# Patient Record
Sex: Male | Born: 1935 | Race: White | Hispanic: No | Marital: Married | Smoking: Former smoker
Health system: Southern US, Community
[De-identification: ages and names within clinical notes are randomized; demographics above are authoritative.]

## PROBLEM LIST (undated history)

## (undated) DIAGNOSIS — H9190 Unspecified hearing loss, unspecified ear: Secondary | ICD-10-CM

## (undated) DIAGNOSIS — K219 Gastro-esophageal reflux disease without esophagitis: Secondary | ICD-10-CM

## (undated) DIAGNOSIS — H269 Unspecified cataract: Secondary | ICD-10-CM

## (undated) DIAGNOSIS — M1990 Unspecified osteoarthritis, unspecified site: Secondary | ICD-10-CM

## (undated) DIAGNOSIS — M51379 Other intervertebral disc degeneration, lumbosacral region without mention of lumbar back pain or lower extremity pain: Secondary | ICD-10-CM

## (undated) DIAGNOSIS — T8130XA Disruption of wound, unspecified, initial encounter: Secondary | ICD-10-CM

## (undated) DIAGNOSIS — M5137 Other intervertebral disc degeneration, lumbosacral region: Secondary | ICD-10-CM

## (undated) DIAGNOSIS — J3089 Other allergic rhinitis: Secondary | ICD-10-CM

## (undated) DIAGNOSIS — T7840XA Allergy, unspecified, initial encounter: Secondary | ICD-10-CM

## (undated) DIAGNOSIS — E785 Hyperlipidemia, unspecified: Secondary | ICD-10-CM

## (undated) DIAGNOSIS — R011 Cardiac murmur, unspecified: Secondary | ICD-10-CM

## (undated) DIAGNOSIS — Z86010 Personal history of colonic polyps: Secondary | ICD-10-CM

## (undated) HISTORY — DX: Other intervertebral disc degeneration, lumbosacral region without mention of lumbar back pain or lower extremity pain: M51.379

## (undated) HISTORY — PX: TONSILLECTOMY: SUR1361

## (undated) HISTORY — PX: OTHER SURGICAL HISTORY: SHX169

## (undated) HISTORY — DX: Unspecified cataract: H26.9

## (undated) HISTORY — PX: COLONOSCOPY: SHX174

## (undated) HISTORY — DX: Hyperlipidemia, unspecified: E78.5

## (undated) HISTORY — PX: CIRCUMCISION: SUR203

## (undated) HISTORY — DX: Personal history of colonic polyps: Z86.010

## (undated) HISTORY — PX: POLYPECTOMY: SHX149

## (undated) HISTORY — DX: Unspecified osteoarthritis, unspecified site: M19.90

## (undated) HISTORY — DX: Allergy, unspecified, initial encounter: T78.40XA

## (undated) HISTORY — DX: Other allergic rhinitis: J30.89

## (undated) HISTORY — DX: Gastro-esophageal reflux disease without esophagitis: K21.9

## (undated) HISTORY — PX: COLONOSCOPY W/ POLYPECTOMY: SHX1380

## (undated) HISTORY — DX: Unspecified hearing loss, unspecified ear: H91.90

## (undated) HISTORY — DX: Other intervertebral disc degeneration, lumbosacral region: M51.37

---
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
## 2016-01-25 ENCOUNTER — Telehealth: Payer: Self-pay | Admitting: Internal Medicine

## 2016-02-05 ENCOUNTER — Encounter: Payer: Self-pay | Admitting: Internal Medicine

## 2016-04-02 ENCOUNTER — Ambulatory Visit (AMBULATORY_SURGERY_CENTER): Payer: Self-pay

## 2016-04-02 VITALS — Ht 72.0 in | Wt 167.0 lb

## 2016-04-02 DIAGNOSIS — Z860100 Personal history of colon polyps, unspecified: Secondary | ICD-10-CM

## 2016-04-02 NOTE — Progress Notes (Signed)
No allergies to eggs or soy No past problems with anesthesia No home oxygen No diet meds  Has email and internet; registered for emmi 

## 2016-04-16 ENCOUNTER — Encounter: Payer: Self-pay | Admitting: Internal Medicine

## 2016-04-16 ENCOUNTER — Ambulatory Visit (AMBULATORY_SURGERY_CENTER): Payer: Medicare Other | Admitting: Internal Medicine

## 2016-04-16 VITALS — BP 133/76 | HR 62 | Temp 97.7°F | Resp 16 | Ht 72.0 in | Wt 167.0 lb

## 2016-04-16 DIAGNOSIS — Z86010 Personal history of colonic polyps: Secondary | ICD-10-CM

## 2016-04-16 DIAGNOSIS — D124 Benign neoplasm of descending colon: Secondary | ICD-10-CM | POA: Diagnosis not present

## 2016-04-16 DIAGNOSIS — D123 Benign neoplasm of transverse colon: Secondary | ICD-10-CM | POA: Diagnosis not present

## 2016-04-16 DIAGNOSIS — D120 Benign neoplasm of cecum: Secondary | ICD-10-CM

## 2016-04-16 DIAGNOSIS — D122 Benign neoplasm of ascending colon: Secondary | ICD-10-CM | POA: Diagnosis not present

## 2016-04-16 MED ORDER — SODIUM CHLORIDE 0.9 % IV SOLN: 500.0000 mL | INTRAVENOUS | Status: DC

## 2016-04-16 NOTE — Patient Instructions (Addendum)
I found and removed 16 polyps today. I will let you know pathology results and when to have another routine colonoscopy by mail.  I appreciate the opportunity to care for you. Gatha Mayer, MD, FACG  YOU HAD AN ENDOSCOPIC PROCEDURE TODAY AT [facility]:   Refer to the procedure report that was given to you for any specific questions about what was found during the examination.  If the procedure report does not answer your questions, please call your gastroenterologist to clarify.  If you requested that your care partner not be given the details of your procedure findings, then the procedure report has been included in a sealed envelope for you to review at your convenience later.  YOU SHOULD EXPECT: Some feelings of bloating in the abdomen. Passage of more gas than usual.  Walking can help get rid of the air that was put into your GI tract during the procedure and reduce the bloating. If you had a lower endoscopy (such as a colonoscopy or flexible sigmoidoscopy) you may notice spotting of blood in your stool or on the toilet paper. If you underwent a bowel prep for your procedure, you may not have a normal bowel movement for a few days.  Please Note:  You might notice some irritation and congestion in your nose or some drainage.  This is from the oxygen used during your procedure.  There is no need for concern and it should clear up in a day or so.  SYMPTOMS TO REPORT IMMEDIATELY:   Following lower endoscopy (colonoscopy or flexible sigmoidoscopy):  Excessive amounts of blood in the stool  Significant tenderness or worsening of abdominal pains  Swelling of the abdomen that is new, acute  Fever of 100F or higher  For urgent or emergent issues, a gastroenterologist can be reached at any hour by calling [phone].   DIET:  We do recommend a small meal at first, but then you may proceed to your regular diet.  Drink plenty of fluids but you should avoid alcoholic  beverages for 24 hours.  ACTIVITY:  You should plan to take it easy for the rest of today and you should NOT DRIVE or use heavy machinery until tomorrow (because of the sedation medicines used during the test).    FOLLOW UP: Our staff will call the number listed on your records the next business day following your procedure to check on you and address any questions or concerns that you may have regarding the information given to you following your procedure. If we do not reach you, we will leave a message.  However, if you are feeling well and you are not experiencing any problems, there is no need to return our call.  We will assume that you have returned to your regular daily activities without incident.  If any biopsies were taken you will be contacted by phone or by letter within the next 1-3 weeks.  Please call us at [phone] if you have not heard about the biopsies in 3 weeks.    SIGNATURES/CONFIDENTIALITY: You and/or your care partner have signed paperwork which will be entered into your electronic medical record.  These signatures attest to the fact that that the information above on your After Visit Summary has been reviewed and is understood.  Full responsibility of the confidentiality of this discharge information lies with you and/or your care-partner.  Please read polyp, diverticulosis, and high fiber diet handouts provided. No aspirin, ibuprofen, naproxen, aleve, or other  non-steroidal anti-inflammatory drugs for 2 weeks after polyp removal.Hold Meloxicam for 2 weeks. Please carry your clip card with you at all times.

## 2016-04-16 NOTE — Progress Notes (Signed)
Called to room to assist during endoscopic procedure.  Patient ID and intended procedure confirmed with present staff. Received instructions for my participation in the procedure from the performing physician.  

## 2016-04-16 NOTE — Progress Notes (Signed)
A and O x3. Report to RN. Tolerated MAC anesthesia well. 

## 2016-04-16 NOTE — Op Note (Signed)
[facility] Patient Name: Glen [name] Procedure Date: 04/16/2016 9:25 AM MRN: [id number] Endoscopist: Gatha Mayer , MD Age: 80 Referring MD:  Date of Birth: May 17, 1936 Gender: Male Account #: [id number] Procedure:                Colonoscopy Indications:              Surveillance: Personal history of adenomatous                            polyps on last colonoscopy 5 years ago Medicines:                Propofol per Anesthesia, Monitored Anesthesia Care Procedure:                Pre-Anesthesia Assessment:                           - Prior to the procedure, a History and Physical                            was performed, and patient medications and                            allergies were reviewed. The patient's tolerance of                            previous anesthesia was also reviewed. The risks                            and benefits of the procedure and the sedation                            options and risks were discussed with the patient.                            All questions were answered, and informed consent                            was obtained. Prior Anticoagulants: The patient                            last took previous NSAID medication 1 day prior to                            the procedure. ASA Grade Assessment: II - A patient                            with mild systemic disease. After reviewing the                            risks and benefits, the patient was deemed in                            satisfactory condition to undergo the procedure.  After obtaining informed consent, the colonoscope                            was passed under direct vision. Throughout the                            procedure, the patient's blood pressure, pulse, and                            oxygen saturations were monitored continuously. The                            Model CF-HQ190L [id number]) scope was introduced     through the anus and advanced to the the cecum,                            identified by appendiceal orifice and ileocecal                            valve. The colonoscopy was performed without                            difficulty. The ileocecal valve, appendiceal                            orifice, and rectum were photographed. The quality                            of the bowel preparation was good. The bowel                            preparation used was Miralax. Scope In: 9:36:40 AM Scope Out: 10:20:14 AM Scope Withdrawal Time: 0 hours 39 minutes 22 seconds  Total Procedure Duration: 0 hours 43 minutes 34 seconds  Findings:                 The perianal and digital rectal examinations were                            normal. Pertinent negatives include normal prostate                            (size, shape, and consistency).                           Two sessile polyps were found in the cecum. The                            polyps were 2 to 3 mm in size. These polyps were                            removed with a cold biopsy forceps. Resection and  retrieval were complete. Verification of patient                            identification for the specimen was done. Estimated                            blood loss was minimal.                           Two sessile polyps were found in the ascending                            colon. The polyps were 12 to 20 mm in size. To                            prevent bleeding after the polypectomy, four                            hemostatic clips were successfully placed (MR                            conditional). There was no bleeding during, or at                            the end, of the procedure. Estimated blood loss:                            none.                           Six sessile polyps were found in the transverse                            colon. The polyps were 3 to 8 mm in size. These                             polyps were removed with a cold snare. Resection                            and retrieval were complete. Verification of                            patient identification for the specimen was done.                            Estimated blood loss was minimal.                           Six sessile polyps were found in the descending                            colon. The polyps were 3 to 6 mm in size. These  polyps were removed with a cold snare. Resection                            and retrieval were complete. Verification of                            patient identification for the specimen was done.                            Estimated blood loss was minimal.                           Multiple diverticula were found in the sigmoid                            colon, descending colon and transverse colon. There                            was no evidence of diverticular bleeding.                           Internal hemorrhoids were found during retroflexion.                           The exam was otherwise without abnormality on                            direct and retroflexion views. Complications:            No immediate complications. No immediate                            complications. Estimated blood loss: Minimal. Estimated Blood Loss:     Estimated blood loss was minimal. Impression:               - Two 2 to 3 mm polyps in the cecum, removed with a                            cold biopsy forceps. Resected and retrieved.                           - Two 12 to 20 mm polyps in the ascending colon.                            Clips (MR conditional) were placed.                           - Six 3 to 8 mm polyps in the transverse colon,                            removed with a cold snare. Resected and retrieved.                           - Six 3 to 6 mm polyps in  the descending colon,                            removed with a cold snare. Resected and  retrieved.                           - Moderate diverticulosis in the sigmoid colon, in                            the descending colon and in the transverse colon.                            There was no evidence of diverticular bleeding.                           - Internal hemorrhoids.                           - The examination was otherwise normal on direct                            and retroflexion views.                           - Personal history of colonic polyps. Recommendation:           - Patient has a contact number available for                            emergencies. The signs and symptoms of potential                            delayed complications were discussed with the                            patient. Return to normal activities tomorrow.                            Written discharge instructions were provided to the                            patient.                           - Resume previous diet.                           - Continue present medications.                           - No aspirin, ibuprofen, naproxen, or other                            non-steroidal anti-inflammatory drugs for 2 weeks  after polyp removal.                           - Repeat colonoscopy is recommended for                            surveillance. The colonoscopy date will be                            determined after pathology results from today's                            exam become available for review.                           - Patient has a contact number available for                            emergencies. The signs and symptoms of potential                            delayed complications were discussed with the                            patient. Return to normal activities tomorrow.                            Written discharge instructions were provided to the                            patient. Gatha Mayer, MD 04/16/2016 10:32:57 AM This  report has been signed electronically.

## 2016-04-17 ENCOUNTER — Telehealth: Payer: Self-pay

## 2016-04-17 NOTE — Telephone Encounter (Signed)
  Follow up Call-  Call back number 04/16/2016  Post procedure Call Back phone  # [phone]  Permission to leave phone message Yes     Patient questions:  Do you have a fever, pain , or abdominal swelling? No. Pain Score  0 *  Have you tolerated food without any problems? Yes.    Have you been able to return to your normal activities? Yes.    Do you have any questions about your discharge instructions: Diet   No. Medications  No. Follow up visit  No.  Do you have questions or concerns about your Care? No.  Actions: * If pain score is 4 or above: No action needed, pain <4.

## 2016-04-23 ENCOUNTER — Encounter: Payer: Self-pay | Admitting: Internal Medicine

## 2016-04-23 DIAGNOSIS — Z86010 Personal history of colonic polyps: Secondary | ICD-10-CM

## 2016-04-23 DIAGNOSIS — Z860101 Personal history of adenomatous and serrated colon polyps: Secondary | ICD-10-CM

## 2016-04-23 HISTORY — DX: Personal history of colonic polyps: Z86.010

## 2016-04-23 HISTORY — DX: Personal history of adenomatous and serrated colon polyps: Z86.0101

## 2016-04-23 NOTE — Progress Notes (Signed)
14 adenomas max 20 mm Recall 1 year 03/2017

## 2016-05-23 NOTE — Telephone Encounter (Signed)
DONE

## 2017-01-20 ENCOUNTER — Encounter: Payer: Self-pay | Admitting: Internal Medicine

## 2017-03-06 ENCOUNTER — Ambulatory Visit (AMBULATORY_SURGERY_CENTER): Payer: Self-pay

## 2017-03-06 VITALS — Ht 72.0 in | Wt 171.2 lb

## 2017-03-06 DIAGNOSIS — Z86010 Personal history of colonic polyps: Secondary | ICD-10-CM

## 2017-03-06 NOTE — Progress Notes (Signed)
Denies allergies to eggs or soy products. Denies complication of anesthesia or sedation. Denies use of weight loss medication. Denies use of O2.   Emmi instructions given for colonoscopy.  

## 2017-03-20 ENCOUNTER — Encounter: Payer: Self-pay | Admitting: Internal Medicine

## 2017-03-20 ENCOUNTER — Ambulatory Visit (AMBULATORY_SURGERY_CENTER): Payer: Medicare Other | Admitting: Internal Medicine

## 2017-03-20 VITALS — BP 124/72 | HR 63 | Temp 98.4°F | Resp 12 | Ht 72.0 in | Wt 171.0 lb

## 2017-03-20 DIAGNOSIS — D126 Benign neoplasm of colon, unspecified: Secondary | ICD-10-CM | POA: Diagnosis not present

## 2017-03-20 DIAGNOSIS — Z86010 Personal history of colonic polyps: Secondary | ICD-10-CM | POA: Diagnosis not present

## 2017-03-20 DIAGNOSIS — D122 Benign neoplasm of ascending colon: Secondary | ICD-10-CM | POA: Diagnosis not present

## 2017-03-20 DIAGNOSIS — D124 Benign neoplasm of descending colon: Secondary | ICD-10-CM

## 2017-03-20 DIAGNOSIS — D120 Benign neoplasm of cecum: Secondary | ICD-10-CM

## 2017-03-20 DIAGNOSIS — K635 Polyp of colon: Secondary | ICD-10-CM | POA: Diagnosis not present

## 2017-03-20 DIAGNOSIS — D123 Benign neoplasm of transverse colon: Secondary | ICD-10-CM

## 2017-03-20 MED ORDER — SODIUM CHLORIDE 0.9 % IV SOLN: 500.0000 mL | INTRAVENOUS | Status: DC

## 2017-03-20 NOTE — Progress Notes (Signed)
Pt has DDD and has some pain when laying on his left side laterally.  The pain radiated down his left leg.  No pain while sitting on his buttocks.  maw

## 2017-03-20 NOTE — Progress Notes (Signed)
Pt's states no medical or surgical changes since previsit or office visit. 

## 2017-03-20 NOTE — Progress Notes (Signed)
Report to PACU, RN, vss, BBS= Clear.  

## 2017-03-20 NOTE — Op Note (Signed)
[place] Patient Name: Glen [name] Procedure Date: 03/20/2017 1:26 PM MRN: [id number] Endoscopist: Gatha Mayer , MD Age: 81 Referring MD:  Date of Birth: 20-Feb-1936 Gender: Male Account #: [id number] Procedure:                Colonoscopy Indications:              Surveillance: History of numerous (> 10) adenomas                            on last colonoscopy (< 3 yrs) Medicines:                Propofol per Anesthesia, Monitored Anesthesia Care Procedure:                Pre-Anesthesia Assessment:                           - Prior to the procedure, a History and Physical                            was performed, and patient medications and                            allergies were reviewed. The patient's tolerance of                            previous anesthesia was also reviewed. The risks                            and benefits of the procedure and the sedation                            options and risks were discussed with the patient.                            All questions were answered, and informed consent                            was obtained. Prior Anticoagulants: The patient has                            taken no previous anticoagulant or antiplatelet                            agents. ASA Grade Assessment: II - A patient with                            mild systemic disease. After reviewing the risks                            and benefits, the patient was deemed in                            satisfactory condition to undergo the procedure.  After obtaining informed consent, the colonoscope                            was passed under direct vision. Throughout the                            procedure, the patient's blood pressure, pulse, and                            oxygen saturations were monitored continuously. The                            Model CF-HQ190L [id number]) scope was introduced                            through  the anus and advanced to the the cecum,                            identified by appendiceal orifice and ileocecal                            valve. The colonoscopy was performed without                            difficulty. The patient tolerated the procedure                            well. The quality of the bowel preparation was                            excellent. The ileocecal valve, appendiceal                            orifice, and rectum were photographed. The bowel                            preparation used was Miralax. Scope In: 1:33:20 PM Scope Out: 2:00:30 PM Scope Withdrawal Time: 0 hours 22 minutes 7 seconds  Total Procedure Duration: 0 hours 27 minutes 10 seconds  Findings:                 The perianal and digital rectal examinations were                            normal. Pertinent negatives include normal prostate                            (size, shape, and consistency).                           At site of prior polypectomy 4 small polyps were                            found in the cecum. The polyps were sessile.  Polypectomy was performed x 3 using a cold biopsy                            forceps. ? resiodual polyp and prior clip was                            removed with a cold snare. Resection and retrieval                            were complete.                           Six sessile polyps were found in the descending                            colon, transverse colon and ascending colon. The                            polyps were diminutive in size. These polyps were                            removed with a cold snare. Resection and retrieval                            were complete. Verification of patient                            identification for the specimen was done. Estimated                            blood loss was minimal.                           Multiple diverticula were found in the sigmoid                             colon.                           The exam was otherwise without abnormality on                            direct and retroflexion views. Complications:            No immediate complications. Estimated Blood Loss:     Estimated blood loss was minimal. Impression:               - At site of prior polypectomy 4 small polyps were                            found in the cecum and removed including prior clip.                           - Six diminutive polyps in the descending colon, in  the transverse colon and in the ascending colon,                            removed with a cold snare. Resected and retrieved.                           - Diverticulosis in the sigmoid colon.                           - The examination was otherwise normal on direct                            and retroflexion views.                           - Personal history of colonic polyps. Recommendation:           - Patient has a contact number available for                            emergencies. The signs and symptoms of potential                            delayed complications were discussed with the                            patient. Return to normal activities tomorrow.                            Written discharge instructions were provided to the                            patient.                           - Resume previous diet.                           - Continue present medications.                           - Repeat colonoscopy is recommended for                            surveillance. The colonoscopy date will be                            determined after pathology results from today's                            exam become available for review. Gatha Mayer, MD 03/20/2017 2:08:07 PM This report has been signed electronically.

## 2017-03-20 NOTE — Progress Notes (Signed)
Called to room to assist during endoscopic procedure.  Patient ID and intended procedure confirmed with present staff. Received instructions for my participation in the procedure from the performing physician.  

## 2017-03-20 NOTE — Patient Instructions (Addendum)
I removed what I think were some residual pieces of the large polyp removed last year - and a clip.  6 other small polyps removed.  I will let you know pathology results and when to have another routine colonoscopy by mail and/or My Chart.  I appreciate the opportunity to care for you. Gatha Mayer, MD, [facility]   Discharge instructions given. Handouts on polyps and diverticulosis. Resume previous medications. YOU HAD AN ENDOSCOPIC PROCEDURE TODAY AT [facility]:   Refer to the procedure report that was given to you for any specific questions about what was found during the examination.  If the procedure report does not answer your questions, please call your gastroenterologist to clarify.  If you requested that your care partner not be given the details of your procedure findings, then the procedure report has been included in a sealed envelope for you to review at your convenience later.  YOU SHOULD EXPECT: Some feelings of bloating in the abdomen. Passage of more gas than usual.  Walking can help get rid of the air that was put into your GI tract during the procedure and reduce the bloating. If you had a lower endoscopy (such as a colonoscopy or flexible sigmoidoscopy) you may notice spotting of blood in your stool or on the toilet paper. If you underwent a bowel prep for your procedure, you may not have a normal bowel movement for a few days.  Please Note:  You might notice some irritation and congestion in your nose or some drainage.  This is from the oxygen used during your procedure.  There is no need for concern and it should clear up in a day or so.  SYMPTOMS TO REPORT IMMEDIATELY:   Following lower endoscopy (colonoscopy or flexible sigmoidoscopy):  Excessive amounts of blood in the stool  Significant tenderness or worsening of abdominal pains  Swelling of the abdomen that is new, acute  Fever of 100F or higher   For urgent or emergent issues, a  gastroenterologist can be reached at any hour by calling [phone].   DIET:  We do recommend a small meal at first, but then you may proceed to your regular diet.  Drink plenty of fluids but you should avoid alcoholic beverages for 24 hours.  ACTIVITY:  You should plan to take it easy for the rest of today and you should NOT DRIVE or use heavy machinery until tomorrow (because of the sedation medicines used during the test).    FOLLOW UP: Our staff will call the number listed on your records the next business day following your procedure to check on you and address any questions or concerns that you may have regarding the information given to you following your procedure. If we do not reach you, we will leave a message.  However, if you are feeling well and you are not experiencing any problems, there is no need to return our call.  We will assume that you have returned to your regular daily activities without incident.  If any biopsies were taken you will be contacted by phone or by letter within the next 1-3 weeks.  Please call us at [phone] if you have not heard about the biopsies in 3 weeks.    SIGNATURES/CONFIDENTIALITY: You and/or your care partner have signed paperwork which will be entered into your electronic medical record.  These signatures attest to the fact that that the information above on your After Visit Summary has been reviewed and  is understood.  Full responsibility of the confidentiality of this discharge information lies with you and/or your care-partner.

## 2017-03-21 ENCOUNTER — Telehealth: Payer: Self-pay

## 2017-03-21 ENCOUNTER — Telehealth: Payer: Self-pay | Admitting: *Deleted

## 2017-03-21 NOTE — Telephone Encounter (Signed)
Attempted to reach pt. With follow up call following endoscopic procedure 03/20/2017.  LM on pt. Ans. Machine.   Will try to reach pt. Later today.

## 2017-03-21 NOTE — Telephone Encounter (Signed)
  Follow up Call-  Call back number 03/20/2017 04/16/2016  Post procedure Call Back phone  # #[phone] cell [phone]  Permission to leave phone message Yes Yes  Some recent data might be hidden     Patient questions:  Do you have a fever, pain , or abdominal swelling? No. Pain Score  0 *  Have you tolerated food without any problems? Yes.    Have you been able to return to your normal activities? Yes.    Do you have any questions about your discharge instructions: Diet   No. Medications  No. Follow up visit  No.  Do you have questions or concerns about your Care? No.  Actions: * If pain score is 4 or above: No action needed, pain <4.

## 2017-03-25 ENCOUNTER — Encounter: Payer: Self-pay | Admitting: Internal Medicine

## 2017-03-25 DIAGNOSIS — Z86010 Personal history of colonic polyps: Secondary | ICD-10-CM

## 2017-03-25 NOTE — Progress Notes (Signed)
Adenomas removed from prior polypectomy site (diminutive) and other adenomas and hyperplastic polyps Recall colon 1 year - My Chart letter

## 2017-05-02 ENCOUNTER — Other Ambulatory Visit (HOSPITAL_COMMUNITY): Payer: Self-pay | Admitting: Neurological Surgery

## 2017-05-02 DIAGNOSIS — M4316 Spondylolisthesis, lumbar region: Secondary | ICD-10-CM

## 2017-05-08 ENCOUNTER — Ambulatory Visit (HOSPITAL_COMMUNITY)
Admission: RE | Admit: 2017-05-08 | Discharge: 2017-05-08 | Disposition: A | Discharge status: Home / Self Care | Payer: Medicare Other | Source: Ambulatory Visit | Attending: Neurological Surgery | Admitting: Neurological Surgery

## 2017-05-08 DIAGNOSIS — M4316 Spondylolisthesis, lumbar region: Secondary | ICD-10-CM | POA: Diagnosis not present

## 2017-05-08 DIAGNOSIS — M5136 Other intervertebral disc degeneration, lumbar region: Secondary | ICD-10-CM | POA: Diagnosis not present

## 2017-05-08 DIAGNOSIS — M5134 Other intervertebral disc degeneration, thoracic region: Secondary | ICD-10-CM | POA: Insufficient documentation

## 2017-05-08 DIAGNOSIS — I700 Atherosclerosis of aorta: Secondary | ICD-10-CM | POA: Insufficient documentation

## 2017-05-08 DIAGNOSIS — M48061 Spinal stenosis, lumbar region without neurogenic claudication: Secondary | ICD-10-CM | POA: Insufficient documentation

## 2017-05-08 DIAGNOSIS — M4804 Spinal stenosis, thoracic region: Secondary | ICD-10-CM | POA: Insufficient documentation

## 2017-05-08 DIAGNOSIS — M4317 Spondylolisthesis, lumbosacral region: Secondary | ICD-10-CM | POA: Diagnosis present

## 2017-05-08 DIAGNOSIS — M5184 Other intervertebral disc disorders, thoracic region: Secondary | ICD-10-CM | POA: Insufficient documentation

## 2017-05-08 DIAGNOSIS — M4150 Other secondary scoliosis, site unspecified: Secondary | ICD-10-CM | POA: Diagnosis present

## 2017-05-08 DIAGNOSIS — M4186 Other forms of scoliosis, lumbar region: Secondary | ICD-10-CM | POA: Diagnosis not present

## 2017-05-12 ENCOUNTER — Ambulatory Visit (HOSPITAL_COMMUNITY): Payer: Medicare Other

## 2017-05-12 ENCOUNTER — Other Ambulatory Visit (HOSPITAL_COMMUNITY): Payer: Medicare Other

## 2017-05-14 ENCOUNTER — Other Ambulatory Visit: Payer: Self-pay | Admitting: Neurological Surgery

## 2017-05-21 NOTE — Pre-Procedure Instructions (Signed)
Glen [name]  05/21/2017      [place], [place] [address] Phone: [phone] Fax: [phone]    Your procedure is scheduled on Tuesday October 9.  Report to [facility] Admitting at 10:00 A.M.  Call this number if you have problems the morning of surgery:  [phone]   Remember:  Do not eat food or drink liquids after midnight.  Take these medicines the morning of surgery with A SIP OF WATER: NONE  7 days prior to surgery STOP taking any Aspirin (unless otherwise instructed by your surgeon), Aleve, Naproxen, Ibuprofen, Motrin, Advil, Goody's, BC's, all herbal medications, fish oil, and all vitamins    Do not wear jewelry  Do not wear lotions, powders, or colognes, or deoderant.  Men may shave face and neck.  Do not bring valuables to the hospital.  [facility] is not responsible for any belongings or valuables.  Contacts, dentures or bridgework may not be worn into surgery.  Leave your suitcase in the car.  After surgery it may be brought to your room.  For patients admitted to the hospital, discharge time will be determined by your treatment team.  Patients discharged the day of surgery will not be allowed to drive home.   Special instructions:    [facility]- Preparing For Surgery  Before surgery, you can play an important role. Because skin is not sterile, your skin needs to be as free of germs as possible. You can reduce the number of germs on your skin by washing with CHG (chlorahexidine gluconate) Soap before surgery.  CHG is an antiseptic cleaner which kills germs and bonds with the skin to continue killing germs even after washing.  Please do not use if you have an allergy to CHG or antibacterial soaps. If your skin becomes reddened/irritated stop using the CHG.  Do not shave (including legs and underarms) for at least 48 hours prior to first CHG shower. It is OK to shave your  face.  Please follow these instructions carefully.   1. Shower the NIGHT BEFORE SURGERY and the MORNING OF SURGERY with CHG.   2. If you chose to wash your hair, wash your hair first as usual with your normal shampoo.  3. After you shampoo, rinse your hair and body thoroughly to remove the shampoo.  4. Use CHG as you would any other liquid soap. You can apply CHG directly to the skin and wash gently with a scrungie or a clean washcloth.   5. Apply the CHG Soap to your body ONLY FROM THE NECK DOWN.  Do not use on open wounds or open sores. Avoid contact with your eyes, ears, mouth and genitals (private parts). Wash genitals (private parts) with your normal soap.  USE REGULAR SHAMPOO AND CONDITIONER FOR HAIR USE REGULAR SOAP FOR FACE AND PRIVATE AREA  6. Wash thoroughly, paying special attention to the area where your surgery will be performed.  7. Thoroughly rinse your body with warm water from the neck down.  8. DO NOT shower/wash with your normal soap after using and rinsing off the CHG Soap.  9. Pat yourself dry with a CLEAN TOWEL and Royal CLOTH  10. Wear CLEAN PAJAMAS to bed the night before surgery, wear comfortable clothes the morning of surgery  11. Place CLEAN SHEETS on your bed the night of your first shower and DO NOT SLEEP WITH PETS.    Day of  Surgery: Do not apply any deodorants/lotions. Please wear clean clothes to the hospital/surgery center.      Please read over the following fact sheets that you were given. Coughing and Deep Breathing and MRSA Information

## 2017-05-22 ENCOUNTER — Encounter (HOSPITAL_COMMUNITY)
Admission: RE | Admit: 2017-05-22 | Discharge: 2017-05-22 | Disposition: A | Discharge status: Home / Self Care | Payer: Medicare Other | Source: Ambulatory Visit | Attending: Neurological Surgery | Admitting: Neurological Surgery

## 2017-05-22 ENCOUNTER — Encounter (HOSPITAL_COMMUNITY): Payer: Self-pay

## 2017-05-22 DIAGNOSIS — E785 Hyperlipidemia, unspecified: Secondary | ICD-10-CM | POA: Insufficient documentation

## 2017-05-22 DIAGNOSIS — K219 Gastro-esophageal reflux disease without esophagitis: Secondary | ICD-10-CM | POA: Insufficient documentation

## 2017-05-22 DIAGNOSIS — Z87891 Personal history of nicotine dependence: Secondary | ICD-10-CM | POA: Insufficient documentation

## 2017-05-22 DIAGNOSIS — Z01812 Encounter for preprocedural laboratory examination: Secondary | ICD-10-CM | POA: Diagnosis present

## 2017-05-22 HISTORY — DX: Cardiac murmur, unspecified: R01.1

## 2017-05-22 LAB — BASIC METABOLIC PANEL
Anion gap: 7 (ref 5–15)
BUN: 12 mg/dL (ref 6–20)
CHLORIDE: 105 mmol/L (ref 101–111)
CO2: 26 mmol/L (ref 22–32)
Calcium: 9.2 mg/dL (ref 8.9–10.3)
Creatinine, Ser: 1.16 mg/dL (ref 0.61–1.24)
GFR calc non Af Amer: 58 mL/min — ABNORMAL LOW (ref >60)
Glucose, Bld: 114 mg/dL — ABNORMAL HIGH (ref 65–99)
POTASSIUM: 4.4 mmol/L (ref 3.5–5.1)
SODIUM: 138 mmol/L (ref 135–145)

## 2017-05-22 LAB — SURGICAL PCR SCREEN
MRSA, PCR: NEGATIVE
STAPHYLOCOCCUS AUREUS: NEGATIVE

## 2017-05-22 LAB — CBC
HEMATOCRIT: 44 % (ref 39.0–52.0)
Hemoglobin: 14.5 g/dL (ref 13.0–17.0)
MCH: 29.9 pg (ref 26.0–34.0)
MCHC: 33 g/dL (ref 30.0–36.0)
MCV: 90.7 fL (ref 78.0–100.0)
Platelets: 173 10*3/uL (ref 150–400)
RBC: 4.85 MIL/uL (ref 4.22–5.81)
RDW: 12.7 % (ref 11.5–15.5)
WBC: 6.1 10*3/uL (ref 4.0–10.5)

## 2017-05-22 LAB — ABO/RH: ABO/RH(D): B POS

## 2017-05-23 NOTE — Progress Notes (Signed)
Anesthesia Chart Review:  Pt is a 81 year old male scheduled for L2-S1 laminectomy with facetectomies, L3-S1 Gill procedure, L5-S1 PLIF with L2-S1 posterior segmental instrumented fusion with pelvic fixation/Mazor on 05/27/2017 with Cyndy Freeze, MD  - PCP is Lance Coon, MD  PMH includes:  Heart murmur (PCP recently sent pt for echo, no valvular concerns on that test), hyperlipidemia, GERD. Former smoker. BMI 24.5  Medications include: lipitor  BP (!) 169/66   Pulse 67   Temp 36.6 C   Resp 20   Ht 6' (1.829 m)   Wt 181 lb 3.2 oz (82.2 kg)   SpO2 95%   BMI 24.58 kg/m   Preoperative labs reviewed.    EKG will be obtained DOS  Echo 11/08/16 ([facility]): 1. Normal LV systolic function, EF 62-22%. Normal LV wall thickness. Stage 1 diastolic dysfunction with impaired LV relaxation pattern.  2. Normal RV systolic function 3. Mild LA enlargement 4. No significant valvular disorders 5. Normal estimated PA systolic pressure 6. No pericardial effusion  Nuclear stress test 09/07/08 ([facility]):  1. No induced ischemia on 85% PMHR 2. Good exercise tolerance 3. Borderline BP response 4. Normal resting systolic LV function.   If EKG acceptable DOS, I anticipate pt can proceed as scheduled.   Willeen Cass, FNP-BC [facility]/Anesthesiology Phone: [phone] 05/23/2017 10:43 AM

## 2017-05-26 MED ORDER — CEFAZOLIN SODIUM-DEXTROSE 2-4 GM/100ML-% IV SOLN
2.0000 g | INTRAVENOUS | Status: AC
  Administered 2017-05-27: 2 g via INTRAVENOUS
  Filled 2017-05-26: qty 100

## 2017-05-27 ENCOUNTER — Inpatient Hospital Stay (HOSPITAL_COMMUNITY)
Admission: RE | Disposition: A | Discharge status: Skilled Nursing Facility | Payer: Self-pay | Source: Ambulatory Visit | Attending: Neurological Surgery

## 2017-05-27 ENCOUNTER — Inpatient Hospital Stay (HOSPITAL_COMMUNITY)
Admission: RE | Admit: 2017-05-27 | Discharge: 2017-05-30 | DRG: 460 | Disposition: A | Discharge status: Skilled Nursing Facility | Payer: Medicare Other | Source: Ambulatory Visit | Attending: Neurological Surgery | Admitting: Neurological Surgery

## 2017-05-27 ENCOUNTER — Encounter (HOSPITAL_COMMUNITY): Payer: Self-pay | Admitting: Anesthesiology

## 2017-05-27 ENCOUNTER — Inpatient Hospital Stay (HOSPITAL_COMMUNITY): Payer: Medicare Other

## 2017-05-27 ENCOUNTER — Inpatient Hospital Stay (HOSPITAL_COMMUNITY): Payer: Medicare Other | Admitting: Anesthesiology

## 2017-05-27 ENCOUNTER — Inpatient Hospital Stay (HOSPITAL_COMMUNITY): Payer: Medicare Other | Admitting: Emergency Medicine

## 2017-05-27 DIAGNOSIS — H9190 Unspecified hearing loss, unspecified ear: Secondary | ICD-10-CM | POA: Diagnosis present

## 2017-05-27 DIAGNOSIS — Z791 Long term (current) use of non-steroidal anti-inflammatories (NSAID): Secondary | ICD-10-CM | POA: Diagnosis not present

## 2017-05-27 DIAGNOSIS — Z9842 Cataract extraction status, left eye: Secondary | ICD-10-CM | POA: Diagnosis not present

## 2017-05-27 DIAGNOSIS — M4727 Other spondylosis with radiculopathy, lumbosacral region: Principal | ICD-10-CM | POA: Diagnosis present

## 2017-05-27 DIAGNOSIS — M48061 Spinal stenosis, lumbar region without neurogenic claudication: Secondary | ICD-10-CM | POA: Diagnosis present

## 2017-05-27 DIAGNOSIS — M4317 Spondylolisthesis, lumbosacral region: Secondary | ICD-10-CM | POA: Diagnosis present

## 2017-05-27 DIAGNOSIS — Z9841 Cataract extraction status, right eye: Secondary | ICD-10-CM

## 2017-05-27 DIAGNOSIS — Z86010 Personal history of colonic polyps: Secondary | ICD-10-CM | POA: Diagnosis not present

## 2017-05-27 DIAGNOSIS — Z87891 Personal history of nicotine dependence: Secondary | ICD-10-CM | POA: Diagnosis not present

## 2017-05-27 DIAGNOSIS — E785 Hyperlipidemia, unspecified: Secondary | ICD-10-CM | POA: Diagnosis present

## 2017-05-27 DIAGNOSIS — Z974 Presence of external hearing-aid: Secondary | ICD-10-CM

## 2017-05-27 DIAGNOSIS — K219 Gastro-esophageal reflux disease without esophagitis: Secondary | ICD-10-CM | POA: Diagnosis present

## 2017-05-27 DIAGNOSIS — M4310 Spondylolisthesis, site unspecified: Secondary | ICD-10-CM

## 2017-05-27 DIAGNOSIS — Z419 Encounter for procedure for purposes other than remedying health state, unspecified: Secondary | ICD-10-CM

## 2017-05-27 DIAGNOSIS — M549 Dorsalgia, unspecified: Secondary | ICD-10-CM | POA: Diagnosis present

## 2017-05-27 HISTORY — PX: LAMINECTOMY: SHX219

## 2017-05-27 HISTORY — PX: APPLICATION OF ROBOTIC ASSISTANCE FOR SPINAL PROCEDURE: SHX6753

## 2017-05-27 SURGERY — POSTERIOR LUMBAR FUSION 3 LEVEL
Anesthesia: General | Site: Spine Lumbar

## 2017-05-27 MED ORDER — CHLORHEXIDINE GLUCONATE CLOTH 2 % EX PADS: 6.0000 | MEDICATED_PAD | Freq: Once | CUTANEOUS | Status: DC

## 2017-05-27 MED ORDER — PROMETHAZINE HCL 25 MG/ML IJ SOLN: 6.2500 mg | INTRAMUSCULAR | Status: DC | PRN

## 2017-05-27 MED ORDER — PROPOFOL 10 MG/ML IV BOLUS
INTRAVENOUS | Status: DC | PRN
  Administered 2017-05-27: 160 mg via INTRAVENOUS

## 2017-05-27 MED ORDER — CELECOXIB 200 MG PO CAPS
200.0000 mg | ORAL_CAPSULE | Freq: Two times a day (BID) | ORAL | Status: DC
  Administered 2017-05-27 – 2017-05-30 (×6): 200 mg via ORAL
  Filled 2017-05-27 (×6): qty 1

## 2017-05-27 MED ORDER — PHENYLEPHRINE HCL 10 MG/ML IJ SOLN
INTRAVENOUS | Status: DC | PRN
  Administered 2017-05-27: 15 ug/min via INTRAVENOUS

## 2017-05-27 MED ORDER — ONDANSETRON HCL 4 MG/2ML IJ SOLN
INTRAMUSCULAR | Status: AC
  Filled 2017-05-27: qty 6

## 2017-05-27 MED ORDER — ATORVASTATIN CALCIUM 10 MG PO TABS
10.0000 mg | ORAL_TABLET | Freq: Every day | ORAL | Status: DC
  Administered 2017-05-27 – 2017-05-29 (×3): 10 mg via ORAL
  Filled 2017-05-27 (×4): qty 1

## 2017-05-27 MED ORDER — DEXAMETHASONE SODIUM PHOSPHATE 10 MG/ML IJ SOLN
INTRAMUSCULAR | Status: AC
  Filled 2017-05-27: qty 2

## 2017-05-27 MED ORDER — BUPIVACAINE-EPINEPHRINE (PF) 0.5% -1:200000 IJ SOLN
INTRAMUSCULAR | Status: DC | PRN
  Administered 2017-05-27: 15 mL

## 2017-05-27 MED ORDER — SODIUM CHLORIDE 0.9 % IV SOLN: 250.0000 mL | INTRAVENOUS | Status: DC

## 2017-05-27 MED ORDER — BUPIVACAINE-EPINEPHRINE (PF) 0.5% -1:200000 IJ SOLN
INTRAMUSCULAR | Status: AC
  Filled 2017-05-27: qty 30

## 2017-05-27 MED ORDER — PANTOPRAZOLE SODIUM 40 MG IV SOLR
40.0000 mg | Freq: Every day | INTRAVENOUS | Status: DC
  Administered 2017-05-27 – 2017-05-28 (×2): 40 mg via INTRAVENOUS
  Filled 2017-05-27 (×2): qty 40

## 2017-05-27 MED ORDER — SODIUM CHLORIDE 0.9% FLUSH
3.0000 mL | INTRAVENOUS | Status: DC | PRN
  Administered 2017-05-27: 3 mL via INTRAVENOUS
  Filled 2017-05-27: qty 3

## 2017-05-27 MED ORDER — SODIUM CHLORIDE 0.9 % IJ SOLN
INTRAMUSCULAR | Status: AC
  Filled 2017-05-27: qty 20

## 2017-05-27 MED ORDER — PHENOL 1.4 % MT LIQD: 1.0000 | OROMUCOSAL | Status: DC | PRN

## 2017-05-27 MED ORDER — FENTANYL CITRATE (PF) 250 MCG/5ML IJ SOLN
INTRAMUSCULAR | Status: DC | PRN
  Administered 2017-05-27: 100 ug via INTRAVENOUS
  Administered 2017-05-27 (×5): 50 ug via INTRAVENOUS
  Administered 2017-05-27: 100 ug via INTRAVENOUS
  Administered 2017-05-27: 50 ug via INTRAVENOUS

## 2017-05-27 MED ORDER — LIDOCAINE 2% (20 MG/ML) 5 ML SYRINGE
INTRAMUSCULAR | Status: AC
  Filled 2017-05-27: qty 10

## 2017-05-27 MED ORDER — 0.9 % SODIUM CHLORIDE (POUR BTL) OPTIME
TOPICAL | Status: DC | PRN
  Administered 2017-05-27: 1000 mL

## 2017-05-27 MED ORDER — HYDROMORPHONE HCL 1 MG/ML IJ SOLN: 0.2500 mg | INTRAMUSCULAR | Status: DC | PRN

## 2017-05-27 MED ORDER — DOCUSATE SODIUM 100 MG PO CAPS
100.0000 mg | ORAL_CAPSULE | Freq: Two times a day (BID) | ORAL | Status: DC
  Administered 2017-05-27 – 2017-05-30 (×6): 100 mg via ORAL
  Filled 2017-05-27 (×6): qty 1

## 2017-05-27 MED ORDER — ACETAMINOPHEN 10 MG/ML IV SOLN
INTRAVENOUS | Status: DC | PRN
  Administered 2017-05-27: 1000 mg via INTRAVENOUS

## 2017-05-27 MED ORDER — OXYCODONE HCL 5 MG PO TABS
5.0000 mg | ORAL_TABLET | ORAL | Status: DC | PRN
  Administered 2017-05-27 – 2017-05-29 (×3): 10 mg via ORAL
  Filled 2017-05-27: qty 2
  Filled 2017-05-27: qty 1
  Filled 2017-05-27 (×2): qty 2

## 2017-05-27 MED ORDER — PROPOFOL 10 MG/ML IV BOLUS
INTRAVENOUS | Status: AC
  Filled 2017-05-27: qty 20

## 2017-05-27 MED ORDER — FLEET ENEMA 7-19 GM/118ML RE ENEM: 1.0000 | ENEMA | Freq: Once | RECTAL | Status: DC | PRN

## 2017-05-27 MED ORDER — SODIUM CHLORIDE 0.9% FLUSH
3.0000 mL | Freq: Two times a day (BID) | INTRAVENOUS | Status: DC
  Administered 2017-05-28 – 2017-05-30 (×4): 3 mL via INTRAVENOUS

## 2017-05-27 MED ORDER — THROMBIN 5000 UNITS EX SOLR
CUTANEOUS | Status: DC | PRN
  Administered 2017-05-27 (×4): 5 mL via TOPICAL

## 2017-05-27 MED ORDER — ONDANSETRON HCL 4 MG/2ML IJ SOLN
4.0000 mg | Freq: Four times a day (QID) | INTRAMUSCULAR | Status: DC | PRN
  Administered 2017-05-27 – 2017-05-28 (×2): 4 mg via INTRAVENOUS
  Filled 2017-05-27: qty 2

## 2017-05-27 MED ORDER — SUGAMMADEX SODIUM 200 MG/2ML IV SOLN
INTRAVENOUS | Status: DC | PRN
  Administered 2017-05-27: 100 mg via INTRAVENOUS

## 2017-05-27 MED ORDER — EPHEDRINE SULFATE 50 MG/ML IJ SOLN
INTRAMUSCULAR | Status: DC | PRN
  Administered 2017-05-27: 5 mg via INTRAVENOUS

## 2017-05-27 MED ORDER — ACETAMINOPHEN 500 MG PO TABS
1000.0000 mg | ORAL_TABLET | Freq: Four times a day (QID) | ORAL | Status: DC
  Administered 2017-05-28 – 2017-05-30 (×10): 1000 mg via ORAL
  Filled 2017-05-27 (×12): qty 2

## 2017-05-27 MED ORDER — ONDANSETRON HCL 4 MG PO TABS
4.0000 mg | ORAL_TABLET | Freq: Four times a day (QID) | ORAL | Status: DC | PRN
  Administered 2017-05-28: 4 mg via ORAL
  Filled 2017-05-27: qty 1

## 2017-05-27 MED ORDER — SURGIFOAM 100 EX MISC
CUTANEOUS | Status: DC | PRN
  Administered 2017-05-27: 20 mL via TOPICAL

## 2017-05-27 MED ORDER — VITAMIN D 1000 UNITS PO TABS
1000.0000 [IU] | ORAL_TABLET | Freq: Every day | ORAL | Status: DC
  Administered 2017-05-28 – 2017-05-30 (×3): 1000 [IU] via ORAL
  Filled 2017-05-27 (×4): qty 1

## 2017-05-27 MED ORDER — BUPIVACAINE LIPOSOME 1.3 % IJ SUSP
20.0000 mL | INTRAMUSCULAR | Status: AC
  Administered 2017-05-27: 20 mL
  Filled 2017-05-27: qty 20

## 2017-05-27 MED ORDER — SUGAMMADEX SODIUM 200 MG/2ML IV SOLN
INTRAVENOUS | Status: AC
  Filled 2017-05-27: qty 2

## 2017-05-27 MED ORDER — ONDANSETRON HCL 4 MG/2ML IJ SOLN
INTRAMUSCULAR | Status: DC | PRN
  Administered 2017-05-27: 4 mg via INTRAVENOUS

## 2017-05-27 MED ORDER — CEFAZOLIN SODIUM-DEXTROSE 1-4 GM/50ML-% IV SOLN
1.0000 g | Freq: Three times a day (TID) | INTRAVENOUS | Status: AC
  Administered 2017-05-27 – 2017-05-28 (×2): 1 g via INTRAVENOUS
  Filled 2017-05-27 (×2): qty 50

## 2017-05-27 MED ORDER — THROMBIN 20000 UNITS EX SOLR
CUTANEOUS | Status: AC
  Filled 2017-05-27: qty 20000

## 2017-05-27 MED ORDER — SODIUM CHLORIDE 0.9 % IV SOLN
INTRAVENOUS | Status: DC | PRN
  Administered 2017-05-27: 16:00:00 via INTRAVENOUS

## 2017-05-27 MED ORDER — LACTATED RINGERS IV SOLN: INTRAVENOUS | Status: DC

## 2017-05-27 MED ORDER — BISACODYL 5 MG PO TBEC: 5.0000 mg | DELAYED_RELEASE_TABLET | Freq: Every day | ORAL | Status: DC | PRN

## 2017-05-27 MED ORDER — GABAPENTIN 300 MG PO CAPS
300.0000 mg | ORAL_CAPSULE | Freq: Three times a day (TID) | ORAL | Status: DC
  Administered 2017-05-27 – 2017-05-29 (×7): 300 mg via ORAL
  Filled 2017-05-27 (×8): qty 1

## 2017-05-27 MED ORDER — ROCURONIUM BROMIDE 10 MG/ML (PF) SYRINGE
PREFILLED_SYRINGE | INTRAVENOUS | Status: DC | PRN
  Administered 2017-05-27: 50 mg via INTRAVENOUS
  Administered 2017-05-27: 20 mg via INTRAVENOUS
  Administered 2017-05-27: 30 mg via INTRAVENOUS
  Administered 2017-05-27: 40 mg via INTRAVENOUS

## 2017-05-27 MED ORDER — VITAMIN B-12 1000 MCG PO TABS
1000.0000 ug | ORAL_TABLET | Freq: Every day | ORAL | Status: DC
  Administered 2017-05-28 – 2017-05-30 (×3): 1000 ug via ORAL
  Filled 2017-05-27 (×3): qty 1

## 2017-05-27 MED ORDER — FENTANYL CITRATE (PF) 250 MCG/5ML IJ SOLN
INTRAMUSCULAR | Status: AC
  Filled 2017-05-27: qty 5

## 2017-05-27 MED ORDER — LIDOCAINE 2% (20 MG/ML) 5 ML SYRINGE
INTRAMUSCULAR | Status: DC | PRN
  Administered 2017-05-27: 80 mg via INTRAVENOUS

## 2017-05-27 MED ORDER — DIAZEPAM 5 MG PO TABS: 5.0000 mg | ORAL_TABLET | Freq: Four times a day (QID) | ORAL | Status: DC | PRN

## 2017-05-27 MED ORDER — THROMBIN 5000 UNITS EX SOLR
CUTANEOUS | Status: AC
  Filled 2017-05-27: qty 5000

## 2017-05-27 MED ORDER — ACETAMINOPHEN 10 MG/ML IV SOLN
INTRAVENOUS | Status: AC
  Filled 2017-05-27: qty 100

## 2017-05-27 MED ORDER — THROMBIN 5000 UNITS EX SOLR
CUTANEOUS | Status: AC
  Filled 2017-05-27: qty 10000

## 2017-05-27 MED ORDER — PHENYLEPHRINE HCL 10 MG/ML IJ SOLN: INTRAMUSCULAR | Status: DC | PRN

## 2017-05-27 MED ORDER — VITAMIN C 500 MG PO TABS
500.0000 mg | ORAL_TABLET | Freq: Every day | ORAL | Status: DC
  Administered 2017-05-28 – 2017-05-30 (×3): 500 mg via ORAL
  Filled 2017-05-27 (×3): qty 1

## 2017-05-27 MED ORDER — LACTATED RINGERS IV SOLN
INTRAVENOUS | Status: DC
  Administered 2017-05-27 (×3): via INTRAVENOUS

## 2017-05-27 MED ORDER — MEPERIDINE HCL 25 MG/ML IJ SOLN: 6.2500 mg | INTRAMUSCULAR | Status: DC | PRN

## 2017-05-27 MED ORDER — OXYCODONE HCL ER 20 MG PO T12A
20.0000 mg | EXTENDED_RELEASE_TABLET | Freq: Two times a day (BID) | ORAL | Status: DC
  Administered 2017-05-27 – 2017-05-28 (×3): 20 mg via ORAL
  Filled 2017-05-27 (×4): qty 1

## 2017-05-27 MED ORDER — VANCOMYCIN HCL 1000 MG IV SOLR
INTRAVENOUS | Status: DC | PRN
  Administered 2017-05-27: 1000 mg via TOPICAL

## 2017-05-27 MED ORDER — SENNA 8.6 MG PO TABS
1.0000 | ORAL_TABLET | Freq: Two times a day (BID) | ORAL | Status: DC
  Administered 2017-05-27 – 2017-05-30 (×6): 8.6 mg via ORAL
  Filled 2017-05-27 (×6): qty 1

## 2017-05-27 MED ORDER — MENTHOL 3 MG MT LOZG
1.0000 | LOZENGE | OROMUCOSAL | Status: DC | PRN
  Filled 2017-05-27: qty 9

## 2017-05-27 MED ORDER — LIDOCAINE-EPINEPHRINE 2 %-1:100000 IJ SOLN
INTRAMUSCULAR | Status: DC | PRN
  Administered 2017-05-27: 15 mL

## 2017-05-27 MED ORDER — BACITRACIN 50000 UNITS IM SOLR
INTRAMUSCULAR | Status: DC | PRN
  Administered 2017-05-27 (×2): 500 mL

## 2017-05-27 MED ORDER — METHOCARBAMOL 750 MG PO TABS
750.0000 mg | ORAL_TABLET | Freq: Four times a day (QID) | ORAL | Status: DC
  Administered 2017-05-27 – 2017-05-28 (×4): 750 mg via ORAL
  Filled 2017-05-27 (×12): qty 1

## 2017-05-27 MED ORDER — OMEGA-3-ACID ETHYL ESTERS 1 G PO CAPS
1.0000 g | ORAL_CAPSULE | Freq: Every day | ORAL | Status: DC
  Administered 2017-05-28 – 2017-05-30 (×3): 1 g via ORAL
  Filled 2017-05-27 (×3): qty 1

## 2017-05-27 MED ORDER — ONDANSETRON HCL 4 MG/2ML IJ SOLN
INTRAMUSCULAR | Status: AC
  Filled 2017-05-27: qty 2

## 2017-05-27 MED ORDER — ROCURONIUM BROMIDE 10 MG/ML (PF) SYRINGE
PREFILLED_SYRINGE | INTRAVENOUS | Status: AC
  Filled 2017-05-27: qty 5

## 2017-05-27 MED ORDER — THROMBIN 5000 UNITS EX SOLR
CUTANEOUS | Status: AC
Start: 2017-05-27 — End: 2017-05-27
  Filled 2017-05-27: qty 5000

## 2017-05-27 MED ORDER — SODIUM CHLORIDE 0.9 % IJ SOLN
INTRAMUSCULAR | Status: DC | PRN
  Administered 2017-05-27: 20 mL

## 2017-05-27 MED ORDER — LACTATED RINGERS IV SOLN
INTRAVENOUS | Status: DC | PRN
  Administered 2017-05-27 (×2): via INTRAVENOUS

## 2017-05-27 MED ORDER — LIDOCAINE-EPINEPHRINE 2 %-1:100000 IJ SOLN
INTRAMUSCULAR | Status: AC
  Filled 2017-05-27: qty 1

## 2017-05-27 MED ORDER — POLYVINYL ALCOHOL 1.4 % OP SOLN
OPHTHALMIC | Status: DC
  Administered 2017-05-30: 09:00:00 via OPHTHALMIC
  Filled 2017-05-27: qty 15

## 2017-05-27 MED ORDER — DEXAMETHASONE SODIUM PHOSPHATE 10 MG/ML IJ SOLN
INTRAMUSCULAR | Status: DC | PRN
  Administered 2017-05-27: 10 mg via INTRAVENOUS

## 2017-05-27 MED ORDER — SODIUM CHLORIDE 0.9 % IV SOLN
INTRAVENOUS | Status: DC
  Administered 2017-05-27 – 2017-05-28 (×3): via INTRAVENOUS

## 2017-05-27 MED ORDER — EPHEDRINE 5 MG/ML INJ
INTRAVENOUS | Status: AC
  Filled 2017-05-27: qty 10

## 2017-05-27 MED ORDER — ADULT MULTIVITAMIN W/MINERALS CH
1.0000 | ORAL_TABLET | Freq: Every day | ORAL | Status: DC
  Administered 2017-05-28 – 2017-05-30 (×3): 1 via ORAL
  Filled 2017-05-27 (×3): qty 1

## 2017-05-27 MED ORDER — SUCCINYLCHOLINE CHLORIDE 200 MG/10ML IV SOSY
PREFILLED_SYRINGE | INTRAVENOUS | Status: AC
  Filled 2017-05-27: qty 10

## 2017-05-27 MED ORDER — VANCOMYCIN HCL 1000 MG IV SOLR
INTRAVENOUS | Status: AC
  Filled 2017-05-27: qty 1000

## 2017-05-27 SURGICAL SUPPLY — 109 items
BAG DECANTER FOR FLEXI CONT (MISCELLANEOUS) ×4 IMPLANT
BENZOIN TINCTURE PRP APPL 2/3 (GAUZE/BANDAGES/DRESSINGS) IMPLANT
BIT DRILL LONG 3.0X30 (BIT) IMPLANT
BIT DRILL LONG 3X80 (BIT) IMPLANT
BIT DRILL LONG 4X80 (BIT) IMPLANT
BIT DRILL SHORT 3.0X30 (BIT) IMPLANT
BIT DRILL SHORT 3X80 (BIT) IMPLANT
BLADE CLIPPER SURG (BLADE) ×2 IMPLANT
BLADE SURG 11 STRL SS (BLADE) IMPLANT
BRIDGE VAR 40-51 ARSENAL (Connector) ×2 IMPLANT
BUR MATCHSTICK NEURO 3.0 LAGG (BURR) ×2 IMPLANT
BUR ROUND FLUTED 5 RND (BURR) ×2 IMPLANT
CAGE POROUS 10X9X25 15D (Cage) ×4 IMPLANT
CANISTER SUCT 3000ML PPV (MISCELLANEOUS) ×2 IMPLANT
CARTRIDGE OIL MAESTRO DRILL (MISCELLANEOUS) ×1 IMPLANT
CHLORAPREP W/TINT 26ML (MISCELLANEOUS) ×2 IMPLANT
CONT SPEC 4OZ CLIKSEAL STRL BL (MISCELLANEOUS) ×8 IMPLANT
CONT SPEC STER OR (MISCELLANEOUS) ×4 IMPLANT
DECANTER SPIKE VIAL GLASS SM (MISCELLANEOUS) ×2 IMPLANT
DERMABOND ADVANCED (GAUZE/BANDAGES/DRESSINGS) ×3
DERMABOND ADVANCED .7 DNX12 (GAUZE/BANDAGES/DRESSINGS) ×3 IMPLANT
DIFFUSER DRILL AIR PNEUMATIC (MISCELLANEOUS) ×2 IMPLANT
DRAPE C-ARM 42X72 X-RAY (DRAPES) ×4 IMPLANT
DRAPE C-ARMOR (DRAPES) ×2 IMPLANT
DRAPE MICROSCOPE LEICA (MISCELLANEOUS) IMPLANT
DRAPE POUCH INSTRU U-SHP 10X18 (DRAPES) ×2 IMPLANT
DRAPE SHEET LG 3/4 BI-LAMINATE (DRAPES) ×2 IMPLANT
DRAPE SURG 17X23 STRL (DRAPES) ×2 IMPLANT
DRSG OPSITE POSTOP 4X10 (GAUZE/BANDAGES/DRESSINGS) ×2 IMPLANT
ELECT BLADE 4.0 EZ CLEAN MEGAD (MISCELLANEOUS) ×2
ELECT COATED BLADE 2.86 ST (ELECTRODE) ×2 IMPLANT
ELECT REM PT RETURN 9FT ADLT (ELECTROSURGICAL) ×2
ELECTRODE BLDE 4.0 EZ CLN MEGD (MISCELLANEOUS) ×1 IMPLANT
ELECTRODE REM PT RTRN 9FT ADLT (ELECTROSURGICAL) ×1 IMPLANT
EVACUATOR 1/8 PVC DRAIN (DRAIN) ×2 IMPLANT
GAUZE SPONGE 4X4 12PLY STRL (GAUZE/BANDAGES/DRESSINGS) IMPLANT
GAUZE SPONGE 4X4 16PLY XRAY LF (GAUZE/BANDAGES/DRESSINGS) ×2 IMPLANT
GLOVE BIO SURGEON STRL SZ7 (GLOVE) IMPLANT
GLOVE BIOGEL PI IND STRL 6.5 (GLOVE) ×4 IMPLANT
GLOVE BIOGEL PI IND STRL 7.0 (GLOVE) ×2 IMPLANT
GLOVE BIOGEL PI IND STRL 7.5 (GLOVE) ×2 IMPLANT
GLOVE BIOGEL PI INDICATOR 6.5 (GLOVE) ×4
GLOVE BIOGEL PI INDICATOR 7.0 (GLOVE) ×2
GLOVE BIOGEL PI INDICATOR 7.5 (GLOVE) ×2
GLOVE ECLIPSE 9.0 STRL (GLOVE) ×2 IMPLANT
GLOVE SS BIOGEL STRL SZ 7.5 (GLOVE) ×3 IMPLANT
GLOVE SUPERSENSE BIOGEL SZ 7.5 (GLOVE) ×3
GLOVE SURG SS PI 6.5 STRL IVOR (GLOVE) ×4 IMPLANT
GOWN STRL REUS W/ TWL LRG LVL3 (GOWN DISPOSABLE) ×5 IMPLANT
GOWN STRL REUS W/ TWL XL LVL3 (GOWN DISPOSABLE) ×1 IMPLANT
GOWN STRL REUS W/TWL LRG LVL3 (GOWN DISPOSABLE) ×5
GOWN STRL REUS W/TWL XL LVL3 (GOWN DISPOSABLE) ×1
GRAFT BN 10X1XDBM MAGNIFUSE (Bone Implant) ×1 IMPLANT
GRAFT BN 5X1XSPNE CVD POST DBM (Bone Implant) ×1 IMPLANT
GRAFT BONE MAGNIFUSE 1X10CM (Bone Implant) ×1 IMPLANT
GRAFT BONE MAGNIFUSE 1X5CM (Bone Implant) ×1 IMPLANT
HEMOSTAT POWDER KIT SURGIFOAM (HEMOSTASIS) ×8 IMPLANT
KIT BASIN OR (CUSTOM PROCEDURE TRAY) ×2 IMPLANT
KIT INFUSE LRG II (Orthopedic Implant) ×2 IMPLANT
KIT ROOM TURNOVER OR (KITS) ×2 IMPLANT
KIT SPINE MAZOR X ROBO DISP (MISCELLANEOUS) ×2 IMPLANT
MARKER SKIN DUAL TIP RULER LAB (MISCELLANEOUS) ×2 IMPLANT
MILL MEDIUM DISP (BLADE) ×2 IMPLANT
NEEDLE HYPO 18GX1.5 BLUNT FILL (NEEDLE) IMPLANT
NEEDLE HYPO 21X1.5 SAFETY (NEEDLE) ×4 IMPLANT
NEEDLE HYPO 25X1 1.5 SAFETY (NEEDLE) IMPLANT
NS IRRIG 1000ML POUR BTL (IV SOLUTION) ×2 IMPLANT
OIL CARTRIDGE MAESTRO DRILL (MISCELLANEOUS) ×2
PACK LAMINECTOMY NEURO (CUSTOM PROCEDURE TRAY) ×2 IMPLANT
PACK UNIVERSAL I (CUSTOM PROCEDURE TRAY) ×2 IMPLANT
PAD ARMBOARD 7.5X6 YLW CONV (MISCELLANEOUS) ×6 IMPLANT
PATTIES SURGICAL .5X1.5 (GAUZE/BANDAGES/DRESSINGS) ×2 IMPLANT
PATTIES SURGICAL 1X1 (DISPOSABLE) ×2 IMPLANT
PIN HEAD 2.5X60MM (PIN) IMPLANT
ROD STRT TI 5.5X500 ARSENAL (Rod) ×2 IMPLANT
RUBBERBAND STERILE (MISCELLANEOUS) IMPLANT
SCREW ARSENAL 5.5X50MM POLY 2 (Screw) ×4 IMPLANT
SCREW ARSENAL POLY 8.5X90MM (Screw) ×4 IMPLANT
SCREW PA 6.5X50 ARSENAL (Screw) ×8 IMPLANT
SCREW PA 7.5X40 ARSENAL (Screw) ×4 IMPLANT
SCREW PA ARSENAL 4.5X45 (Screw) ×4 IMPLANT
SCREW SCHANZ SA 4.0MM (MISCELLANEOUS) IMPLANT
SCREW SET SPINAL ARSENAL 47127 (Screw) ×24 IMPLANT
SEALER BIPOLAR AQUA 6.0 (INSTRUMENTS) ×2 IMPLANT
SPONGE LAP 18X18 X RAY DECT (DISPOSABLE) ×2 IMPLANT
SPONGE NEURO XRAY DETECT 1X3 (DISPOSABLE) IMPLANT
SPONGE SURGIFOAM ABS GEL 100 (HEMOSTASIS) ×2 IMPLANT
STRIP SURGICAL 1 X 6 IN (GAUZE/BANDAGES/DRESSINGS) IMPLANT
STRIP SURGICAL 1/2 X 6 IN (GAUZE/BANDAGES/DRESSINGS) IMPLANT
STRIP SURGICAL 1/4 X 6 IN (GAUZE/BANDAGES/DRESSINGS) IMPLANT
STRIP SURGICAL 3/4 X 6 IN (GAUZE/BANDAGES/DRESSINGS) IMPLANT
SUT STRATAFIX 1PDS 45CM VIOLET (SUTURE) ×2 IMPLANT
SUT STRATAFIX MNCRL+ 3-0 PS-2 (SUTURE) ×1
SUT STRATAFIX MONOCRYL 3-0 (SUTURE) ×1
SUT STRATAFIX SPIRAL + 2-0 (SUTURE) ×2 IMPLANT
SUT VIC AB 0 CT1 18XCR BRD8 (SUTURE) ×1 IMPLANT
SUT VIC AB 0 CT1 8-18 (SUTURE) ×1
SUT VIC AB 2-0 CT1 18 (SUTURE) IMPLANT
SUT VIC AB 3-0 SH 8-18 (SUTURE) ×2 IMPLANT
SUT VIC AB 4-0 PS2 27 (SUTURE) IMPLANT
SUTURE STRATFX MNCRL+ 3-0 PS-2 (SUTURE) ×1 IMPLANT
SYR 30ML LL (SYRINGE) ×4 IMPLANT
SYR 3ML LL SCALE MARK (SYRINGE) ×4 IMPLANT
SYR 5ML LL (SYRINGE) IMPLANT
TIP TROCAR NITINOL ILLICO 20 (INSTRUMENTS) ×24 IMPLANT
TOWEL GREEN STERILE (TOWEL DISPOSABLE) ×2 IMPLANT
TOWEL GREEN STERILE FF (TOWEL DISPOSABLE) ×2 IMPLANT
TUBE CONNECTING 12X1/4 (SUCTIONS) IMPLANT
WATER STERILE IRR 1000ML POUR (IV SOLUTION) ×2 IMPLANT

## 2017-05-27 NOTE — H&P (Signed)
CC:  No chief complaint on file. Back and leg pain  HPI: Glen [name] is a 81 y.o. male with back and leg pain.  He has lumbosacral spondylosis with severe stenosis and a spondylolisthesis at L5-S1.  He presents for elective lumbar decompression and fusion.  He denies any changes in symptoms.  PMH: Past Medical History:  Diagnosis Date  . Allergy   . Arthritis   . Cataracts, bilateral    bil cataracts removed  . DDD (degenerative disc disease), lumbosacral    pain radiates down left leg  . Environmental and seasonal allergies   . GERD (gastroesophageal reflux disease)   . Hearing loss    bilateral hearing aids  . Heart murmur    recently  saw cardiologist and said it was fine   . Hx of adenomatous colonic polyps 04/23/2016  . Hyperlipidemia     PSH: Past Surgical History:  Procedure Laterality Date  . cataract     bilat  . CIRCUMCISION    . COLONOSCOPY    . COLONOSCOPY W/ POLYPECTOMY    . POLYPECTOMY    . TONSILLECTOMY    . wisdom teth extraction      SH: Social History  Substance Use Topics  . Smoking status: Former Smoker    Packs/day: 1.00    Types: Cigarettes    Quit date: 08/02/2016  . Smokeless tobacco: Never Used  . Alcohol use 4.2 oz/week    7 Shots of liquor per week    MEDS: Prior to Admission medications   Medication Sig Start Date End Date Taking? Authorizing Provider  atorvastatin (LIPITOR) 10 MG tablet Take 10 mg by mouth daily.   Yes [provider]  Carboxymeth-Glycerin-Polysorb (REFRESH OPTIVE ADVANCED OP) Place 1 drop into both eyes 3 (three) times a week.   Yes [provider]  Cholecalciferol (VITAMIN D3) 1000 units CAPS Take 1,000 Units by mouth daily.    Yes [provider]  cyanocobalamin 1000 MCG tablet Take 1,000 mcg by mouth daily.   Yes [provider]  meloxicam (MOBIC) 15 MG tablet Take 7.5 mg by mouth daily.   Yes [provider]  Multiple Vitamin (MULTIVITAMIN) tablet Take 1 tablet by  mouth daily. Senior formula   Yes [provider]  Omega-3 Fatty Acids (FISH OIL PO) Take 1 capsule by mouth daily.   Yes [provider]  OVER THE COUNTER MEDICATION Take 1 tablet by mouth daily. Super C Complex   Yes [provider]  vitamin C (ASCORBIC ACID) 500 MG tablet Take 500 mg by mouth daily.   Yes [provider]    ALLERGY: No Known Allergies  ROS: ROS  NEUROLOGIC EXAM: Awake, alert, oriented Memory and concentration grossly intact Speech fluent, appropriate CN grossly intact Motor exam: Upper Extremities Deltoid Bicep Tricep Grip  Right 5/5 5/5 5/5 5/5  Left 5/5 5/5 5/5 5/5   Lower Extremity IP Quad PF DF EHL  Right 5/5 5/5 5/5 5/5 5/5  Left 5/5 5/5 5/5 5/5 5/5   Sensation grossly intact to LT  IMAGING: No new imaging  IMPRESSION: - 81 y.o. male with back and radicular leg pain.  He is neurologically intact.  PLAN: - L2-pelvis fusion, L2-S1 decompression - We have reviewed the risks, benefits, and alternatives to surgery and he wishes to proceed.

## 2017-05-27 NOTE — Brief Op Note (Signed)
05/27/2017  6:32 PM  PATIENT:  Glen [name]  81 y.o. male  PRE-OPERATIVE DIAGNOSIS:  Spondylolisthesis, Lumbosacral region  POST-OPERATIVE DIAGNOSIS:  Spondylolisthesis, Lumbosacral region  PROCEDURE:  Procedure(s): Lumbar Two-Sacral One Laminectomy with facetectomies, Lumbar Three to Sacral One Gill procedure/Lumbar Five-Sacral One Posterior lumbar interbody fusion with Lumbar Two to Sacral Oneposterior segmental instrumented fusion with pelvic fixation/Mazor (N/A) APPLICATION OF ROBOTIC ASSISTANCE FOR SPINAL PROCEDURE (N/A)  SURGEON:  Surgeon(s) and Role:    * Jaris Kohles, Kevan Ny, MD - Primary    * Earnie Larsson, MD - Assisting  PHYSICIAN ASSISTANT:   ASSISTANTS: Earnie Larsson, MD  ANESTHESIA:   general  EBL:  Total I/O In: 1062 [I.V.:3550; Blood:890] Out: 2375 [Urine:275; Blood:2100]  BLOOD ADMINISTERED:none  DRAINS: Medium hemovac   LOCAL MEDICATIONS USED:  MARCAINE    and LIDOCAINE   SPECIMEN:  No Specimen  DISPOSITION OF SPECIMEN:  N/A  COUNTS:  YES  TOURNIQUET:  * No tourniquets in log *  DICTATION: .Dragon Dictation  PLAN OF CARE: Admit to inpatient   PATIENT DISPOSITION:  PACU - hemodynamically stable.   Delay start of Pharmacological VTE agent (>24hrs) due to surgical blood loss or risk of bleeding: yes

## 2017-05-27 NOTE — Anesthesia Procedure Notes (Signed)
Procedure Name: Intubation Date/Time: 05/27/2017 12:45 PM Performed by: Bryson Corona Pre-anesthesia Checklist: Patient identified, Emergency Drugs available, Suction available and Patient being monitored Patient Re-evaluated:Patient Re-evaluated prior to induction Oxygen Delivery Method: Circle system utilized Preoxygenation: Pre-oxygenation with 100% oxygen Induction Type: IV induction Ventilation: Mask ventilation without difficulty Laryngoscope Size: Mac and 4 Grade View: Grade IV Tube type: Oral Tube size: 7.0 mm Number of attempts: 2 Airway Equipment and Method: Patient positioned with wedge pillow and Bougie stylet Placement Confirmation: positive ETCO2 and breath sounds checked- equal and bilateral Secured at: 22 cm Tube secured with: Tape Dental Injury: Teeth and Oropharynx as per pre-operative assessment  Difficulty Due To: Difficult Airway- due to anterior larynx, Difficult Airway- due to limited oral opening and Difficult Airway- due to reduced neck mobility Comments: DL x 1 with Miller 2. Grade 3 view. Unable to pass ETT. DL x 2 by Dr. Smith Robert with MAC 4. Bougie used to pass 7.0 ETT.

## 2017-05-27 NOTE — Anesthesia Preprocedure Evaluation (Addendum)
Anesthesia Evaluation  Patient identified by MRN, date of birth, ID band Patient awake    Reviewed: Allergy & Precautions, NPO status , Patient's Chart, lab work & pertinent test results  Airway Mallampati: II  TM Distance: <3 FB Neck ROM: Full    Dental  (+) Teeth Intact   Pulmonary former smoker,    breath sounds clear to auscultation       Cardiovascular negative cardio ROS   Rhythm:Regular Rate:Normal     Neuro/Psych negative neurological ROS     GI/Hepatic Neg liver ROS, GERD  ,  Endo/Other  negative endocrine ROS  Renal/GU negative Renal ROS     Musculoskeletal  (+) Arthritis ,   Abdominal   Peds  Hematology negative hematology ROS (+)   Anesthesia Other Findings Day of surgery medications reviewed with the patient.  Reproductive/Obstetrics                           Anesthesia Physical Anesthesia Plan  ASA: II  Anesthesia Plan: General   Post-op Pain Management:    Induction: Intravenous  PONV Risk Score and Plan: 3 and Ondansetron, Dexamethasone, Midazolam and Propofol infusion  Airway Management Planned: Oral ETT  Additional Equipment:   Intra-op Plan:   Post-operative Plan: Extubation in OR  Informed Consent: I have reviewed the patients History and Physical, chart, labs and discussed the procedure including the risks, benefits and alternatives for the proposed anesthesia with the patient or authorized representative who has indicated his/her understanding and acceptance.   Dental advisory given  Plan Discussed with: CRNA  Anesthesia Plan Comments:         Anesthesia Quick Evaluation

## 2017-05-27 NOTE — Progress Notes (Addendum)
05/27/2017 8:49 PM  Patient admitted from PCU. 283ml bloody output from drain. 38ml bloody drainage from foley. oncall MD paged.,   8:57 PM Spoke with Dr. Annette Stable. No new orders at this time. Will continue to monitor.    Mick Sell RN

## 2017-05-27 NOTE — Transfer of Care (Signed)
Immediate Anesthesia Transfer of Care Note  Patient: Glen [name]  Procedure(s) Performed: Lumbar Two-Sacral One Laminectomy with facetectomies, Lumbar Three to Sacral One Gill procedure/Lumbar Five-Sacral One Posterior lumbar interbody fusion with Lumbar Two to Sacral Oneposterior segmental instrumented fusion with pelvic fixation/Mazor (N/A Spine Lumbar) APPLICATION OF ROBOTIC ASSISTANCE FOR SPINAL PROCEDURE (N/A Spine Lumbar)  Patient Location: PACU  Anesthesia Type:General  Level of Consciousness: awake, alert  and patient cooperative  Airway & Oxygen Therapy: Patient Spontanous Breathing  Post-op Assessment: Report given to RN and Post -op Vital signs reviewed and stable  Post vital signs: Reviewed and stable  Last Vitals:  Vitals:   05/27/17 1015  BP: (!) 180/79  Pulse: 66  Resp: 18  Temp: 36.6 C  SpO2: 97%    Last Pain:  Vitals:   05/27/17 1101  TempSrc:   PainSc: 0-No pain      Patients Stated Pain Goal: 3 (62/03/55 9741)  Complications: No apparent anesthesia complications

## 2017-05-28 ENCOUNTER — Encounter (HOSPITAL_COMMUNITY): Payer: Self-pay | Admitting: General Practice

## 2017-05-28 LAB — BASIC METABOLIC PANEL
ANION GAP: 13 (ref 5–15)
BUN: 19 mg/dL (ref 6–20)
CALCIUM: 7.7 mg/dL — AB (ref 8.9–10.3)
CO2: 19 mmol/L — AB (ref 22–32)
Chloride: 104 mmol/L (ref 101–111)
Creatinine, Ser: 1.59 mg/dL — ABNORMAL HIGH (ref 0.61–1.24)
GFR calc non Af Amer: 40 mL/min — ABNORMAL LOW (ref >60)
GFR, EST AFRICAN AMERICAN: 46 mL/min — AB (ref >60)
GLUCOSE: 257 mg/dL — AB (ref 65–99)
POTASSIUM: 5.1 mmol/L (ref 3.5–5.1)
Sodium: 136 mmol/L (ref 135–145)

## 2017-05-28 LAB — CBC
HEMATOCRIT: 31.3 % — AB (ref 39.0–52.0)
HEMOGLOBIN: 10.3 g/dL — AB (ref 13.0–17.0)
MCH: 29.8 pg (ref 26.0–34.0)
MCHC: 32.9 g/dL (ref 30.0–36.0)
MCV: 90.5 fL (ref 78.0–100.0)
Platelets: 128 10*3/uL — ABNORMAL LOW (ref 150–400)
RBC: 3.46 MIL/uL — AB (ref 4.22–5.81)
RDW: 12.9 % (ref 11.5–15.5)
WBC: 18.4 10*3/uL — AB (ref 4.0–10.5)

## 2017-05-28 MED FILL — Sodium Chloride IV Soln 0.9%: INTRAVENOUS | Qty: 1000 | Status: AC

## 2017-05-28 MED FILL — Gelatin Absorbable MT Powder: OROMUCOSAL | Qty: 1 | Status: AC

## 2017-05-28 MED FILL — Thrombin For Soln 5000 Unit: CUTANEOUS | Qty: 5000 | Status: AC

## 2017-05-28 MED FILL — Heparin Sodium (Porcine) Inj 1000 Unit/ML: INTRAMUSCULAR | Qty: 30 | Status: AC

## 2017-05-28 NOTE — Progress Notes (Signed)
Orthopedic Tech Progress Note Patient Details:  Vasili Fok 06/08/1936 [id number] Brace completed by bio-tech. Patient ID: Edinson Domeier, male   DOB: 02/23/1936, 81 y.o.   MRN: [id number]   Braulio Bosch 05/28/2017, 2:27 PM

## 2017-05-28 NOTE — Op Note (Signed)
05/27/2017  3:40 PM  PATIENT:  Glen [name]  81 y.o. male  PRE-OPERATIVE DIAGNOSIS:  Lumbosacral spondylosis with radiculopathy; lumbosacral spondylolisthesis L5-S1; lumbar stenosis L2-S1  POST-OPERATIVE DIAGNOSIS:  Same  PROCEDURE:  L2-S1 laminectomy for decompression with L3-4, L4-5, and L5-S1 Gill procedures; L5-S1 posterior lumbar interbody fusion; L2-S1 posterior segmental instrumented fusion; pelvic fixation utilizing S2 alar iliac screws; use of BMP  SURGEON:  Aldean Ast, MD  ASSISTANTS: Earnie Larsson, MD  ANESTHESIA:   General  DRAINS: Medium hemovac   SPECIMEN:  None  INDICATION FOR PROCEDURE: 81 year old man with back and leg pain that has not improved with aggressive medical management.  I recommended the above procedure.  The patient understood the risks, benefits, and alternatives and potential outcomes and wished to proceed.  PROCEDURE DETAILS: After smooth induction of general endotracheal anesthesia the patient was turned prone on the [place] table. The skin of the lumbar area was clipped of hair and wiped out with alcohol. It was prepped and draped in the usual sterile fashion. The planned incision was injected with a mixture of lidocaine and Marcaine with epinephrine.  The skin was opened sharply and a subperiosteal dissection was performed to expose the lateral edges of the lamina of L2-S2.  Subperiosteal dissection was performed over the L2-3, L3-4, L4-5, and L5-S1 facet joints bilaterally.    The [facility] robotic system was registered to the patient using fluoroscopy.  Using the [facility] as a drill guide traditional pedicle screw tracts were then drilled at L2, L3, L4, L5, S1, and for S2AI screws.  K-Wires were placed down the trajectories and I then tapped over the K-wires.  K-wires were removed and the trajectories were palpated with a ball tip probe and found to be competent.  I then resected the spinous processes of inferior L2, L3, L4, L5, and the small spinous  process at S1.  I used the high speed drill to drill a trough across the lamina and pars at L2, L3, L4, and L5.  I then used an osteotome to fracture the bone across the pars and separate the inferior articular process at each level.  This was separated from the underlying dura and ligament and removed from the surgical field.  The underlying ligament was elevated and resected.  Decompression was carried out to the lateral recesses with Kerrison rongeurs.  The superior edge of the exposed superior articular processes was resected to expose underlying annulus.  The thecal sac and nerve roots was separated from the posterior vertebral body wall and annulus where interbody fusion was planned.  At L5-S1 an annulotomy was made. Using sequentially larger disc shavers this space was expanded. Disc material was removed with shavers, curettes, and the bone was decorticated with a rasp. The interbody space was packed with a combination of locally harvested bone and BMP two porous titanium spacers were inserted into the interbody space.  Screws were then placed to the appropriate depth down the cannulated tracts.  A lordotic rod was inserted and secured in the screw caps. Final tightening with a torque wrench was performed at all levels. A cross connector was placed.  The remaining facet joints and transverse processes from L2 to the sacral ala were decorticated with the high speed burr.  Fusion substrate including BMP, locally harvested autograft, and allograft was placed in the lateral gutters.  Meticulous hemostasis was obtained. The wound was irrigated with bacitracin saline. Exparel was injected into the paraspinous muscles.  A medium Hemovac drain was placed  below the fascia. About 1 g of vancomycin powder was inserted into the wound. The wound was closed in routine anatomic layers. The skin was closed with a running subcuticular monocryl suture and then sealed with dermabond.   The patient was returned to the  supine position and awoke without complication.  PATIENT DISPOSITION:  PACU - hemodynamically stable.   Delay start of Pharmacological VTE agent (>24hrs) due to surgical blood loss or risk of bleeding:  yes

## 2017-05-28 NOTE — Progress Notes (Signed)
Orthopedic Tech Progress Note Patient Details:  [name] 20-Dec-1935 [id number]  Patient ID: Glen [name], male   DOB: 1936/04/04, 81 y.o.   MRN: [id number]   Hildred Priest 05/28/2017, 9:26 AM Called in bio-tech brace order; spoke with Junie Panning

## 2017-05-28 NOTE — Evaluation (Signed)
Occupational Therapy Evaluation Patient Details Name: Glen [name] MRN: [id number] DOB: 05-17-1936 Today's Date: 05/28/2017    History of Present Illness 81 y.o. male admitted on 05-27-17 for elective L2-S1 laminectomy with facetectomies, L3-S1 Gill procedure, L2-S1 fusion.  Pt with significant PMH of DDD.     Clinical Impression   PTA, pt was independent with ADL and functional mobility and enjoys golfing. He currently requires min assist for toilet transfers, LB ADL, and standing grooming tasks. Initiated education concerning back precautions related to ADL as well as compensatory strategies for LB ADL. Pt with one loss of balance during functional mobility from bathroom to bed and educated pt on safety measures. He would benefit from continued OT services while admitted to improve independence and safety with ADL and functional mobility prior to return home with 24 hour assistance from family.    Follow Up Recommendations  No OT follow up;Supervision/Assistance - 24 hour    Equipment Recommendations  3 in 1 bedside commode    Recommendations for Other Services       Precautions / Restrictions Precautions Precautions: Fall Precaution Comments: a bit of left leg residual weakness, and mildly unsteady on his feet. Restrictions Weight Bearing Restrictions: No      Mobility Bed Mobility Overal bed mobility: Needs Assistance Bed Mobility: Rolling;Sit to Sidelying Rolling: Min guard       Sit to sidelying: Min guard General bed mobility comments: Min guard assist for safety and cues for log roll.  Transfers Overall transfer level: Needs assistance Equipment used: 1 person hand held assist Transfers: Sit to/from Stand Sit to Stand: Min guard         General transfer comment: Min guard assist for safety.    Balance Overall balance assessment: Needs assistance Sitting-balance support: Feet supported;Bilateral upper extremity supported Sitting balance-Leahy Scale: Good     Standing balance support: No upper extremity supported;Single extremity supported Standing balance-Leahy Scale: Fair Standing balance comment: Able to statically stand without UE support for brief periods.                            ADL either performed or assessed with clinical judgement   ADL Overall ADL's : Needs assistance/impaired Eating/Feeding: Set up;Sitting   Grooming: Minimal assistance;Standing   Upper Body Bathing: Sitting;Min guard Upper Body Bathing Details (indicate cue type and reason): cues for back precautions Lower Body Bathing: Minimal assistance;Sit to/from stand   Upper Body Dressing : Minimal assistance   Lower Body Dressing: Minimal assistance;Sit to/from stand   Toilet Transfer: Minimal assistance;Moderate assistance;BSC;RW           Functional mobility during ADLs: Minimal assistance;Moderate assistance;Rolling walker General ADL Comments: Pt with LOB x1 when moving too quickly from bathroom and fatigued. Required mod assist to recover balance. Educated pt on back precautions related to ADL participation including LB dressing and bathing compensatory strategies.      Vision   Vision Assessment?: No apparent visual deficits Additional Comments: Need to further assess     Perception     Praxis      Pertinent Vitals/Pain Pain Assessment: 0-10 Pain Score: 2  Pain Location: incisional Pain Descriptors / Indicators: Aching;Burning Pain Intervention(s): Limited activity within patient's tolerance;Monitored during session;Repositioned     Hand Dominance     Extremity/Trunk Assessment Upper Extremity Assessment Upper Extremity Assessment: Overall WFL for tasks assessed   Lower Extremity Assessment Lower Extremity Assessment: Defer to PT evaluation LLE Deficits / Details:  left leg with some functional deficits, soft knee decreased stride length with gait.   LLE Sensation:  (intact to LT, R LE intact to LT as well)   Cervical /  Trunk Assessment Cervical / Trunk Assessment: Other exceptions Cervical / Trunk Exceptions: s/p spinal surgery   Communication Communication Communication: HOH   Cognition Arousal/Alertness: Awake/alert Behavior During Therapy: WFL for tasks assessed/performed Overall Cognitive Status: Within Functional Limits for tasks assessed                                     General Comments       Exercises     Shoulder Instructions      Home Living Family/patient expects to be discharged to:: Private residence Living Arrangements: Spouse/significant other Available Help at Discharge: Family;Available 24 hours/day Type of Home: House Home Access: Stairs to enter [employer] of Steps: 2 Entrance Stairs-Rails: None Home Layout: Multi-level;Full bath on main level;Able to live on main level with bedroom/bathroom     Bathroom Shower/Tub: Occupational psychologist: Handicapped height     Home Equipment: Shower seat - built in;Grab bars - toilet;Grab bars - tub/shower (built in seat is too small)          Prior Functioning/Environment Level of Independence: Independent                 OT Problem List: Decreased strength;Decreased range of motion;Decreased activity tolerance;Impaired balance (sitting and/or standing);Decreased safety awareness;Decreased knowledge of use of DME or AE;Decreased knowledge of precautions;Pain      OT Treatment/Interventions: Self-care/ADL training;Therapeutic exercise;Energy conservation;DME and/or AE instruction;Therapeutic activities;Patient/family education;Balance training    OT Goals(Current goals can be found in the care plan section) Acute Rehab OT Goals Patient Stated Goal: to get back to golfing. OT Goal Formulation: With patient Time For Goal Achievement: 06/11/17 Potential to Achieve Goals: Good ADL Goals Pt Will Perform Grooming: with modified independence;standing Pt Will Perform Upper Body  Dressing: with min assist;sitting;with caregiver independent in assisting (to don brace) Pt Will Perform Lower Body Dressing: with modified independence;sit to/from stand Pt Will Transfer to Toilet: with modified independence;ambulating;bedside commode Pt Will Perform Toileting - Clothing Manipulation and hygiene: with modified independence;sit to/from stand Pt Will Perform Tub/Shower Transfer: Shower transfer;with modified independence;3 in 1;rolling walker  OT Frequency: Min 2X/week   Barriers to D/C:            Co-evaluation              AM-PAC PT "6 Clicks" Daily Activity     Outcome Measure Help from another person eating meals?: None Help from another person taking care of personal grooming?: A Little Help from another person toileting, which includes using toliet, bedpan, or urinal?: A Little Help from another person bathing (including washing, rinsing, drying)?: A Little Help from another person to put on and taking off regular upper body clothing?: A Little Help from another person to put on and taking off regular lower body clothing?: A Little 6 Click Score: 19   End of Session Equipment Utilized During Treatment: Rolling walker;Back brace Nurse Communication: Mobility status  Activity Tolerance: Patient tolerated treatment well Patient left: in bed;with call bell/phone within reach  OT Visit Diagnosis: Other abnormalities of gait and mobility (R26.89);Pain Pain - Right/Left:  (back) Pain - part of body:  (back)  Time: 1140-1155 OT Time Calculation (min): 15 min Charges:  OT General Charges $OT Visit: 1 Visit OT Evaluation $OT Eval Moderate Complexity: 1 Mod G-Codes:     Norman Herrlich, MS OTR/L  Pager: Alfred A Kaushik Maul 05/28/2017, 2:00 PM

## 2017-05-28 NOTE — Progress Notes (Signed)
Pt has post operative NV medicated with Zofran as needed for nausea. Pt co nausea when when dangling on the side of bed  Assisted back to bed and did not ambulate as pt awaiting on back brace too. Endorsed to dc  foley as pt get OOB  out as pt  this am.

## 2017-05-28 NOTE — Evaluation (Signed)
Physical Therapy Evaluation Patient Details Name: Glen [name] MRN: [id number] DOB: 12-16-1935 Today's Date: 05/28/2017   History of Present Illness  81 y.o. male admitted on 05-27-17 for elective L2-S1 laminectomy with facetectomies, L3-S1 Gill procedure, L2-S1 fusion.  Pt with significant PMH of DDD.    Clinical Impression  Pt nauseated and a little imbalanced when up walking the halls requiring min hand held assist for his first hallway walk.  Education initiated and wife is a bit nervous re: his home care.  They may benefit from a [facility] therapy safety eval to ease caregiver anxiety and reinforce education.   PT to follow acutely for deficits listed below.       Follow Up Recommendations Home health PT;Other (comment) (home safety eval as family is anxious and this would help)    Equipment Recommendations  None recommended by PT;Other (comment) (mentioned shower seat, defer to OT recs)    Recommendations for Other Services   NA    Precautions / Restrictions Precautions Precautions: Fall Precaution Comments: a bit of left leg residual weakness, and mildly unsteady on his feet. Restrictions Weight Bearing Restrictions: No      Mobility  Bed Mobility               General bed mobility comments: Pt was OOB in chair, discussed and daughter visually demonstrated log roll technique for in-out of bed, but pt did not practice with me as he was already up.   Transfers Overall transfer level: Needs assistance Equipment used: 1 person hand held assist Transfers: Sit to/from Stand Sit to Stand: Min guard         General transfer comment: Min guard assist from low recliner chair. Verbal cues for safe hand placement.   Ambulation/Gait Ambulation/Gait assistance: Min assist Ambulation Distance (Feet): 130 Feet Assistive device: 1 person hand held assist Gait Pattern/deviations: Step-through pattern;Decreased step length - left;Staggering right;Drifts right/left     General  Gait Details: Mildly staggering gait pattern, a bit of functional residual weakness at left leg, but not buckling (pt reported his left leg was the painful side PTA).          Balance Overall balance assessment: Needs assistance Sitting-balance support: Feet supported;Bilateral upper extremity supported Sitting balance-Leahy Scale: Good     Standing balance support: No upper extremity supported;Single extremity supported Standing balance-Leahy Scale: Fair                               Pertinent Vitals/Pain Pain Assessment: 0-10 Pain Score: 2  Pain Location: incisional Pain Descriptors / Indicators: Aching;Burning Pain Intervention(s): Limited activity within patient's tolerance;Monitored during session;Repositioned    Home Living Family/patient expects to be discharged to:: Private residence Living Arrangements: Spouse/significant other Available Help at Discharge: Family;Available 24 hours/day Type of Home: House Home Access: Stairs to enter Entrance Stairs-Rails: None Entrance Stairs-Number of Steps: 2 Home Layout: Multi-level;Full bath on main level;Able to live on main level with bedroom/bathroom Home Equipment: Shower seat - built in;Grab bars - toilet;Grab bars - tub/shower (built in seat is too small)      Prior Function Level of Independence: Independent                  Extremity/Trunk Assessment   Upper Extremity Assessment Upper Extremity Assessment: Defer to OT evaluation    Lower Extremity Assessment Lower Extremity Assessment: LLE deficits/detail LLE Deficits / Details: left leg with some functional deficits, soft knee decreased  stride length with gait.   LLE Sensation:  (intact to LT, R LE intact to LT as well)    Cervical / Trunk Assessment Cervical / Trunk Assessment: Other exceptions Cervical / Trunk Exceptions: post op  Communication   Communication: HOH  Cognition Arousal/Alertness: Awake/alert Behavior During Therapy:  WFL for tasks assessed/performed Overall Cognitive Status: Within Functional Limits for tasks assessed                                               Assessment/Plan    PT Assessment Patient needs continued PT services  PT Problem List Decreased strength;Decreased activity tolerance;Decreased balance;Decreased mobility;Decreased knowledge of use of DME;Decreased knowledge of precautions;Pain       PT Treatment Interventions DME instruction;Gait training;Stair training;Functional mobility training;Therapeutic activities;Therapeutic exercise;Balance training;Neuromuscular re-education;Patient/family education    PT Goals (Current goals can be found in the Care Plan section)  Acute Rehab PT Goals Patient Stated Goal: to get back to golfing. PT Goal Formulation: With patient/family Time For Goal Achievement: 06/11/17 Potential to Achieve Goals: Good    Frequency Min 5X/week           AM-PAC PT "6 Clicks" Daily Activity  Outcome Measure Difficulty turning over in bed (including adjusting bedclothes, sheets and blankets)?: Unable Difficulty moving from lying on back to sitting on the side of the bed? : Unable Difficulty sitting down on and standing up from a chair with arms (e.g., wheelchair, bedside commode, etc,.)?: Unable Help needed moving to and from a bed to chair (including a wheelchair)?: A Little Help needed walking in hospital room?: A Little Help needed climbing 3-5 steps with a railing? : A Little 6 Click Score: 12    End of Session Equipment Utilized During Treatment: Back brace;Gait belt Activity Tolerance: Patient limited by pain Patient left: in bed;Other (comment) (seated EOB with OT coming in to work with him) Nurse Communication: Mobility status PT Visit Diagnosis: Other symptoms and signs involving the nervous system (R29.898);Pain Pain - Right/Left:  (low) Pain - part of body:  (back)    Time: 1110-1140 PT Time Calculation (min) (ACUTE  ONLY): 30 min   Charges:        Wells Guiles B. Shala Baumbach, PT, DPT [phone]   PT Evaluation $PT Eval Low Complexity: 1 Low PT Treatments $Gait Training: 8-22 mins   05/28/2017, 11:52 AM

## 2017-05-28 NOTE — Anesthesia Postprocedure Evaluation (Signed)
Anesthesia Post Note  Patient: Glen [name]  Procedure(s) Performed: Lumbar Two-Sacral One Laminectomy with facetectomies, Lumbar Three to Sacral One Gill procedure/Lumbar Five-Sacral One Posterior lumbar interbody fusion with Lumbar Two to Sacral Oneposterior segmental instrumented fusion with pelvic fixation/Mazor (N/A Spine Lumbar) APPLICATION OF ROBOTIC ASSISTANCE FOR SPINAL PROCEDURE (N/A Spine Lumbar)     Patient location during evaluation: PACU Anesthesia Type: General Level of consciousness: sedated and patient cooperative Pain management: pain level controlled Vital Signs Assessment: post-procedure vital signs reviewed and stable Respiratory status: spontaneous breathing Cardiovascular status: stable Anesthetic complications: no    Last Vitals:  Vitals:   05/27/17 2010 05/28/17 0019  BP: (!) 123/98   Pulse: 79   Resp: 12   Temp: 36.4 C (!) 36.4 C  SpO2: 100%     Last Pain:  Vitals:   05/28/17 0019  TempSrc: Oral  PainSc:                  [name]

## 2017-05-28 NOTE — Progress Notes (Signed)
No acute events Pain well controlled Moving legs well Labs look good Ambulate with therapy today

## 2017-05-29 ENCOUNTER — Encounter (HOSPITAL_COMMUNITY): Payer: Self-pay | Admitting: Neurological Surgery

## 2017-05-29 LAB — POCT I-STAT 4, (NA,K, GLUC, HGB,HCT)
Glucose, Bld: 110 mg/dL — ABNORMAL HIGH (ref 65–99)
Glucose, Bld: 144 mg/dL — ABNORMAL HIGH (ref 65–99)
HCT: 37 % — ABNORMAL LOW (ref 39.0–52.0)
HEMATOCRIT: 34 % — AB (ref 39.0–52.0)
HEMOGLOBIN: 12.6 g/dL — AB (ref 13.0–17.0)
HEMOGLOBIN: 11.6 g/dL — AB (ref 13.0–17.0)
POTASSIUM: 4.5 mmol/L (ref 3.5–5.1)
Potassium: 3.9 mmol/L (ref 3.5–5.1)
SODIUM: 138 mmol/L (ref 135–145)
Sodium: 139 mmol/L (ref 135–145)

## 2017-05-29 MED ORDER — PANTOPRAZOLE SODIUM 40 MG PO TBEC
40.0000 mg | DELAYED_RELEASE_TABLET | Freq: Every day | ORAL | Status: DC
  Administered 2017-05-29: 40 mg via ORAL
  Filled 2017-05-29: qty 1

## 2017-05-29 NOTE — Progress Notes (Addendum)
Occupational Therapy Treatment Patient Details Name: Glen [name] MRN: [id number] DOB: 11/13/1935 Today's Date: 05/29/2017    History of present illness 81 y.o. male admitted on 05-27-17 for elective L2-S1 laminectomy with facetectomies, L3-S1 Gill procedure, L2-S1 fusion.  Pt with significant PMH of DDD.     OT comments  Patient with apparent confusion limiting ability to participate with OT. Pt able to recall 1/3 back precautions and not oriented to day or time. Increased time for processing and difficulty with problem solving.  Required mod A with sit - stand transfer and unable to safely ambulate this pm due to legs "giving away" when standing with RW. Only able to safely complete stand pivot transfer with blocking knees to prevent buckling/fall.Pt demonstrated difficulty maintaining sitting balance EOB due to posterior bias. Desat to 87 on RA. Other VSS. Discussed with nsg regarding changes being possibly related to medication. If pt does not progress, he will benefit form rehab at [facility]. Discussed with family. Will continue to follow acutely to maximize independence with ADL and facilitate DC to next venue of care.   Follow Up Recommendations  Supervision/Assistance - 24 hour;SNF;Other (comment) (if pt does not progress)    Equipment Recommendations  3 in 1 bedside commode    Recommendations for Other Services      Precautions / Restrictions Precautions Precautions: Fall Precaution Comments: left leg residual weakness and now right leg soft during gait as well.  Required Braces or Orthoses: Spinal Brace Spinal Brace: Thoracolumbosacral orthotic;Applied in sitting position       Mobility Bed Mobility Overal bed mobility: Needs Assistance Bed Mobility: Sit to Sidelying         Sit to sidelying: Mod assist General bed mobility comments: A to bring BLE onto bed  Transfers Overall transfer level: Needs assistance Equipment used: Rolling walker (2 wheeled) Transfers: Sit  to/from [facility] Sit to Stand: Mod assist Stand pivot transfers: Mod assist       General transfer comment: Pt's legs "giving away" and required mod A to pivot to [employer]. Not safe to use RW    Balance Overall balance assessment: Needs assistance Sitting-balance support: Feet supported;Bilateral upper extremity supported Sitting balance-Leahy Scale: Poor Sitting balance - Comments: mild posterior lean. unable to sit EOB without BUE support   Standing balance support: Bilateral upper extremity supported Standing balance-Leahy Scale: Poor Standing balance comment: needs external support today from RW and therapist.                            ADL either performed or assessed with clinical judgement   ADL Overall ADL's : Needs assistance/impaired             Lower Body Bathing: Moderate assistance;Sit to/from stand       Lower Body Dressing: Maximal assistance   Toilet Transfer: Moderate assistance;Stand-pivot           Functional mobility during ADLs: Moderate assistance;Rolling walker;Cueing for safety;Cueing for sequencing General ADL Comments: Decline in function since yesterday. discussed with family     Vision       Perception     Praxis      Cognition Arousal/Alertness: Awake/alert Behavior During Therapy: Restless Overall Cognitive Status: Impaired/Different from baseline Area of Impairment: Attention;Memory;Following commands;Safety/judgement;Awareness;Problem solving                   Current Attention Level: Sustained Memory: Decreased recall of precautions;Decreased short-term memory Following Commands: Follows one  step commands with increased time Safety/Judgement: Decreased awareness of safety;Decreased awareness of deficits Awareness: Intellectual Problem Solving: Slow processing;Decreased initiation;Difficulty sequencing;Requires verbal cues General Comments: Pt apparently confused this session. Able to  recall 1/3 back precautions  ?delirium        Exercises     Shoulder Instructions       General Comments      Pertinent Vitals/ Pain       Pain Assessment: Faces Pain Score: 7  Faces Pain Scale: Hurts little more Pain Location: back Pain Descriptors / Indicators: Discomfort;Grimacing Pain Intervention(s): Limited activity within patient's tolerance;Repositioned  Home Living                                          Prior Functioning/Environment              Frequency  Min 3X/week        Progress Toward Goals  OT Goals(current goals can now be found in the care plan section)  Progress towards OT goals: Not progressing toward goals - comment (decline in cogntive status)  Acute Rehab OT Goals Patient Stated Goal: to go home in time for [employer] game on Saturday OT Goal Formulation: With patient/family Time For Goal Achievement: 06/11/17 Potential to Achieve Goals: Good ADL Goals Pt Will Perform Grooming: with modified independence;standing Pt Will Perform Upper Body Dressing: with min assist;sitting;with caregiver independent in assisting Pt Will Perform Lower Body Dressing: with modified independence;sit to/from stand Pt Will Transfer to Toilet: with modified independence;ambulating;bedside commode Pt Will Perform Toileting - Clothing Manipulation and hygiene: with modified independence;sit to/from stand Pt Will Perform Tub/Shower Transfer: Shower transfer;with modified independence;3 in 1;rolling walker  Plan Discharge plan needs to be updated;Frequency needs to be updated    Co-evaluation                 AM-PAC PT "6 Clicks" Daily Activity     Outcome Measure   Help from another person eating meals?: None Help from another person taking care of personal grooming?: A Little Help from another person toileting, which includes using toliet, bedpan, or urinal?: A Lot Help from another person bathing (including washing, rinsing,  drying)?: A Lot Help from another person to put on and taking off regular upper body clothing?: A Lot Help from another person to put on and taking off regular lower body clothing?: A Lot 6 Click Score: 15    End of Session Equipment Utilized During Treatment: Gait belt;Rolling walker;Back brace  OT Visit Diagnosis: Other abnormalities of gait and mobility (R26.89);Pain Pain - part of body:  (back)   Activity Tolerance Other (comment) (limited by apparnet confusion/lethargy)   Patient Left in bed;with call bell/phone within reach;with bed alarm set;with SCD's reapplied   Nurse Communication Mobility status;Other (comment) (pts status)        Time: 1510-1535 OT Time Calculation (min): 25 min  Charges: OT General Charges $OT Visit: 1 Visit OT Treatments $Self Care/Home Management : 23-37 mins  [facility], OT/L  [phone] 05/29/2017   Riham Polyakov,HILLARY 05/29/2017, 4:20 PM

## 2017-05-29 NOTE — Progress Notes (Signed)
Physical Therapy Treatment Patient Details Name: Glen [name] MRN: [id number] DOB: 08-Jul-1936 Today's Date: 05/29/2017    History of Present Illness 81 y.o. male admitted on 05-27-17 for elective L2-S1 laminectomy with facetectomies, L3-S1 Gill procedure, L2-S1 fusion.  Pt with significant PMH of DDD.      PT Comments    Pt is weaker today with some mild confusion and decreased safety awareness and awareness of his deficits.  Based on today's performance, he would be safer at SNF level rehab at discharge.  We will continue to assess daily based on his performance.    Follow Up Recommendations  SNF     Equipment Recommendations  Rolling walker with 5" wheels;Other (comment) (shower seat)    Recommendations for Other Services   NA     Precautions / Restrictions Precautions Precautions: Fall Precaution Comments: left leg residual weakness and now right leg soft during gait as well.  Required Braces or Orthoses: Spinal Brace Spinal Brace: Thoracolumbosacral orthotic;Applied in sitting position    Mobility  Bed Mobility               General bed mobility comments: Pt was OOB in chair.   Transfers Overall transfer level: Needs assistance Equipment used: Rolling walker (2 wheeled) Transfers: Sit to/from Stand Sit to Stand: Min assist         General transfer comment: Min assist to stabilize trunk during transitions up and down.  Assist also needed to stabilize the RW.   Ambulation/Gait Ambulation/Gait assistance: Mod assist;+2 safety/equipment Ambulation Distance (Feet): 130 Feet (60'x1 with seated rest break 70'x1) Assistive device: Rolling walker (2 wheeled) Gait Pattern/deviations: Step-through pattern;Trunk flexed (knees flexed L>R) Gait velocity: too fast to be safe, frequent verbal cues to slow down.    General Gait Details: Pt definiately needed the support of the RW today.  Increased left knee flexion as well as increased R knee flexion with gait.  Pt able  to extend with cues, but had no awareness that his knee flexion bil was increasing during gait.  Chair ultimately needed to follow for safety and I was still worried his knees would buckle on him and we would be unable to catch him with the chair.           Balance Overall balance assessment: Needs assistance Sitting-balance support: Feet supported;Bilateral upper extremity supported Sitting balance-Leahy Scale: Fair Sitting balance - Comments: bil upper extremity support needed to keep trunk off of back of chair in sitting.    Standing balance support: Bilateral upper extremity supported Standing balance-Leahy Scale: Poor Standing balance comment: needs external support today from RW and therapist.                             Cognition Arousal/Alertness: Awake/alert Behavior During Therapy: WFL for tasks assessed/performed Overall Cognitive Status: Impaired/Different from baseline Area of Impairment: Awareness;Memory;Safety/judgement                         Safety/Judgement: Decreased awareness of safety;Decreased awareness of deficits Awareness: Intellectual   General Comments: Pt seemingly mildly confused.  Saying some things that are non-sensical to the context, also reporting he could safely get up around the room by himself, no awareness of LE weakness/buckling during gait (did not know why I was making him stop and rest as his legs are sinking deeper into flexion).  Pertinent Vitals/Pain Pain Assessment: 0-10 Pain Score: 7  Pain Location: incisional Pain Descriptors / Indicators: Aching;Burning Pain Intervention(s): Limited activity within patient's tolerance;Monitored during session;Repositioned;Premedicated before session           PT Goals (current goals can now be found in the care plan section) Acute Rehab PT Goals Patient Stated Goal: to go home in time for [employer] game on Saturday Progress towards PT goals: Not  progressing toward goals - comment (more confused, weaker)    Frequency    Min 5X/week      PT Plan Discharge plan needs to be updated       AM-PAC PT "6 Clicks" Daily Activity  Outcome Measure  Difficulty turning over in bed (including adjusting bedclothes, sheets and blankets)?: Unable Difficulty moving from lying on back to sitting on the side of the bed? : Unable Difficulty sitting down on and standing up from a chair with arms (e.g., wheelchair, bedside commode, etc,.)?: Unable Help needed moving to and from a bed to chair (including a wheelchair)?: A Lot Help needed walking in hospital room?: A Lot Help needed climbing 3-5 steps with a railing? : A Lot 6 Click Score: 9    End of Session Equipment Utilized During Treatment: Back brace;Gait belt Activity Tolerance: Patient limited by fatigue;Patient limited by pain Patient left: in chair;with call bell/phone within reach;with family/visitor present Nurse Communication: Mobility status PT Visit Diagnosis: Other symptoms and signs involving the nervous system (R29.898);Pain Pain - Right/Left:  (lower) Pain - part of body:  (back)     Time: 1121-6244 PT Time Calculation (min) (ACUTE ONLY): 33 min  Annastacia Duba B. Yamaira Spinner, PT, DPT [phone]    Charges:  $Gait Training: 23-37 mins          05/29/2017, 2:48 PM

## 2017-05-29 NOTE — Progress Notes (Signed)
Neurosurgery Progress Note  No issues overnight. Ambulating in halls  EXAM:  BP (!) 131/59 (BP Location: Left Arm)   Pulse 92   Temp 98.2 F (36.8 C) (Oral)   Resp 20   Ht 6' (1.829 m)   Wt 82.2 kg (181 lb 3.2 oz)   SpO2 95%   BMI 24.58 kg/m   Awake, alert, oriented  Speech fluent, appropriate  MAEW with good strength  PLAN Continue to mobilize with PT and OT Hopefully dc tomorrow

## 2017-05-30 MED ORDER — OXYCODONE-ACETAMINOPHEN 7.5-325 MG PO TABS
1.0000 | ORAL_TABLET | ORAL | 0 refills | Status: AC | PRN
Start: 2017-05-30 — End: 2018-05-30

## 2017-05-30 MED ORDER — GABAPENTIN 300 MG PO CAPS: 300.0000 mg | ORAL_CAPSULE | Freq: Three times a day (TID) | ORAL | 2 refills | Status: DC

## 2017-05-30 NOTE — Care Management Note (Signed)
Case Management Note  Patient Details  Name: Glen [name] MRN: [id number] Date of Birth: 10/25/1935  Subjective/Objective:    81 y.o. male admitted on 05-27-17 for elective L2-S1 laminectomy with facetectomies, L3-S1 Gill procedure, L2-S1 fusion.  PTA, pt independent, lives at home with spouse.                  Action/Plan: PT recommending SNF for rehab, and pt agreeable.  Referral to CSW to facilitate dc to SNF upon medical stability, anticipated today.    Expected Discharge Date:  05/30/17               Expected Discharge Plan:  Skilled Nursing Facility  In-House Referral:  Clinical Social Work  Discharge planning Services  CM Consult  Post Acute Care Choice:    Choice offered to:     DME Arranged:    DME Agency:     HH Arranged:    [place] Agency:     Status of Service:  Completed, signed off  If discussed at [employer], dates discussed:    Additional Comments:  1154  J. Braidan Ricciardi, RN, BSN Spoke with Shelle Iron, CSW regarding need for SNF and pt being medically ready for dc today.    Reinaldo Raddle, RN, BSN  Trauma/Neuro ICU Case Manager [phone]

## 2017-05-30 NOTE — Discharge Summary (Signed)
Date of Admission: 05/27/2017  Date of Discharge: 05/30/17  Admission Diagnosis: Lumbosacral spondylolisthesis, lumbosacral spondylosis with radiculopathy  Discharge Diagnosis: Same  Procedure Performed: L2-S1 laminectomy for decompression with L3-4, L4-5, and L5-S1 Gill procedures; L5-S1 posterior lumbar interbody fusion; L2-S1 posterior segmental instrumented fusion; pelvic fixation utilizing S2 alar iliac screws; use of BMP  Attending: Jovian Lembcke, Kevan Ny, MD  Hospital Course:  The patient was admitted for the above listed operation and had an uncomplicated post-operative course.  They were discharged in stable condition.  Follow up: 3 weeks  Allergies as of 05/30/2017   No Known Allergies     Medication List    STOP taking these medications   meloxicam 15 MG tablet Commonly known as:  MOBIC     TAKE these medications   atorvastatin 10 MG tablet Commonly known as:  LIPITOR Take 10 mg by mouth daily.   cyanocobalamin 1000 MCG tablet Take 1,000 mcg by mouth daily.   FISH OIL PO Take 1 capsule by mouth daily.   gabapentin 300 MG capsule Commonly known as:  NEURONTIN Take 1 capsule (300 mg total) by mouth 3 (three) times daily.   multivitamin tablet Take 1 tablet by mouth daily. Senior formula   OVER THE COUNTER MEDICATION Take 1 tablet by mouth daily. Super C Complex   oxyCODONE-acetaminophen 7.5-325 MG tablet Commonly known as:  PERCOCET Take 1-2 tablets by mouth every 4 (four) hours as needed for severe pain.   REFRESH OPTIVE ADVANCED OP Place 1 drop into both eyes 3 (three) times a week.   vitamin C 500 MG tablet Commonly known as:  ASCORBIC ACID Take 500 mg by mouth daily.   Vitamin D3 1000 units Caps Take 1,000 Units by mouth daily.

## 2017-05-30 NOTE — Progress Notes (Signed)
Pt IV's removed, dressing on back changed. Pt dressed and back brace Donned. Pt in wheelchair and discharged with family.

## 2017-05-30 NOTE — NC FL2 (Signed)
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, [place] [address] [place], [place] [address]      Provider Number: [id number]  Attending Physician Name and Address:  Ditty, Kevan Ny, MD  Relative Name and Phone Number:  Shubham Thackston - wife; [phone]    Current Level of Care: Hospital Recommended Level of Care: [place] [facility] and [place], [place]) Prior Approval Number:    Date Approved/Denied:   PASRR Number:    Discharge Plan: SNF    Current Diagnoses: Patient Active Problem List   Diagnosis Date Noted  . Spondylolisthesis 05/27/2017  . Hx of adenomatous colonic polyps 04/23/2016    Orientation RESPIRATION BLADDER Height & Weight     Self, Time, Situation, Place  Normal Continent Weight: 181 lb 3.2 oz (82.2 kg) Height:  6' (182.9 cm)  BEHAVIORAL SYMPTOMS/MOOD NEUROLOGICAL BOWEL NUTRITION STATUS      Continent Diet (Regular)  AMBULATORY STATUS COMMUNICATION OF NEEDS Skin   Extensive Assist (Plus 2 physical assistance per PT) Verbally Other (Comment) (Incision on back)                       Personal Care Assistance Level of Assistance  Bathing, Feeding, Dressing Bathing Assistance: Maximum assistance Feeding assistance: Limited assistance (Assistance with set-up) Dressing Assistance: Maximum assistance     Functional Limitations Info  Sight, Hearing, Speech Sight Info: Adequate Hearing Info: Impaired (Wears 2 hearing aides) Speech Info: Adequate    SPECIAL CARE FACTORS FREQUENCY  PT (By licensed PT), OT (By licensed OT)     PT Frequency: Evaluated 10/10 and a minimum of 5X per week recommended during hospitalization OT Frequency: Evaluated 10/0 and a  minimun of 3X per week recommended during hospitalization            Contractures Contractures Info: Not present    Additional Factors Info  Code Status, Allergies Code Status Info: Full Allergies Info: No known allergies           Current Medications (05/30/2017):  This is the current hospital active medication list Current Facility-Administered Medications  Medication Dose Route Frequency Provider Last Rate Last Dose  . 0.9 %  sodium chloride infusion   Intravenous Continuous Ditty, Kevan Ny, MD   Stopped at 05/28/17 1500  . 0.9 %  sodium chloride infusion  250 mL Intravenous Continuous Ditty, Kevan Ny, MD      . acetaminophen (TYLENOL) tablet 1,000 mg  1,000 mg Oral Q6H Ditty, Kevan Ny, MD   1,000 mg at 05/30/17 1220  . atorvastatin (LIPITOR) tablet 10 mg  10 mg Oral Daily Ditty, Kevan Ny, MD   10 mg at 05/29/17 1721  . bisacodyl (DULCOLAX) EC tablet 5 mg  5 mg Oral Daily PRN Ditty, Kevan Ny, MD      . celecoxib (CELEBREX) capsule 200 mg  200 mg Oral Q12H Ditty, Kevan Ny, MD   200 mg at 05/30/17 1036  . cholecalciferol (VITAMIN D) tablet 1,000 Units  1,000 Units Oral Daily Ditty, Kevan Ny, MD   1,000 Units at 05/30/17 1036  . diazepam (VALIUM) tablet 5 mg  5 mg Oral Q6H PRN Ditty, Kevan Ny, MD      . docusate sodium (COLACE) capsule 100  mg  100 mg Oral BID Ditty, Kevan Ny, MD   100 mg at 05/30/17 1037  . gabapentin (NEURONTIN) capsule 300 mg  300 mg Oral TID Ditty, Kevan Ny, MD   300 mg at 05/29/17 2139  . menthol-cetylpyridinium (CEPACOL) lozenge 3 mg  1 lozenge Oral PRN Ditty, Kevan Ny, MD       Or  . phenol (CHLORASEPTIC) mouth spray 1 spray  1 spray Mouth/Throat PRN Ditty, Kevan Ny, MD      . methocarbamol (ROBAXIN) tablet 750 mg  750 mg Oral QID Ditty, Kevan Ny, MD   750 mg at 05/28/17 1755  . multivitamin with minerals tablet 1 tablet  1 tablet Oral Daily Ditty, Kevan Ny, MD   1 tablet at  05/30/17 1036  . omega-3 acid ethyl esters (LOVAZA) capsule 1 g  1 g Oral Daily Ditty, Kevan Ny, MD   1 g at 05/30/17 1036  . ondansetron (ZOFRAN) tablet 4 mg  4 mg Oral Q6H PRN Ditty, Kevan Ny, MD   4 mg at 05/28/17 1223   Or  . ondansetron (ZOFRAN) injection 4 mg  4 mg Intravenous Q6H PRN Ditty, Kevan Ny, MD   4 mg at 05/28/17 0331  . oxyCODONE (Oxy IR/ROXICODONE) immediate release tablet 5-10 mg  5-10 mg Oral Q3H PRN Ditty, Kevan Ny, MD   10 mg at 05/29/17 1314  . oxyCODONE (OXYCONTIN) 12 hr tablet 20 mg  20 mg Oral Q12H Ditty, Kevan Ny, MD   20 mg at 05/28/17 2254  . pantoprazole (PROTONIX) EC tablet 40 mg  40 mg Oral Daily Harvel Quale, [place]   40 mg at 05/29/17 2138  . polyvinyl alcohol (LIQUIFILM TEARS) 1.4 % ophthalmic solution   Both Eyes Once per day on Mon Wed Fri Ditty, Kevan Ny, MD      . senna [facility]) tablet 8.6 mg  1 tablet Oral BID Ditty, Kevan Ny, MD   8.6 mg at 05/30/17 1036  . sodium chloride flush (NS) 0.9 % injection 3 mL  3 mL Intravenous Q12H Ditty, Kevan Ny, MD   3 mL at 05/30/17 1000  . sodium chloride flush (NS) 0.9 % injection 3 mL  3 mL Intravenous PRN Ditty, Kevan Ny, MD   3 mL at 05/27/17 2215  . sodium phosphate (FLEET) 7-19 GM/118ML enema 1 enema  1 enema Rectal Once PRN Ditty, Kevan Ny, MD      . vitamin B-12 (CYANOCOBALAMIN) tablet 1,000 mcg  1,000 mcg Oral Daily Ditty, Kevan Ny, MD   1,000 mcg at 05/30/17 1036  . vitamin C (ASCORBIC ACID) tablet 500 mg  500 mg Oral Daily Ditty, Kevan Ny, MD   500 mg at 05/30/17 1036     Discharge Medications: Please see discharge summary for a list of discharge medications.  Relevant Imaging Results:  Relevant Lab Results:   Additional Information ss#[id number].   Sable Feil, LCSW

## 2017-05-30 NOTE — Progress Notes (Signed)
Physical Therapy Treatment Patient Details Name: Glen [name] MRN: [id number] DOB: 1936/07/07 Today's Date: 05/30/2017    History of Present Illness 81 y.o. male admitted on 05-27-17 for elective L2-S1 laminectomy with facetectomies, L3-S1 Gill procedure, L2-S1 fusion.  Pt with significant PMH of DDD.      PT Comments    Pt is progressing with mobility today, however, he is not consistently preforming well enough for me to be comfortable having him d/c home.  I continue to recommend SNF level rehab at this time.  PT will continue to follow acutely and update recommendations daily.    Follow Up Recommendations  SNF     Equipment Recommendations  3in1 (PT);Rolling walker with 5" wheels;Other (comment) (shower seat)    Recommendations for Other Services   NA     Precautions / Restrictions Precautions Precautions: Fall Precaution Comments: left leg with residual weakness Required Braces or Orthoses: Spinal Brace Spinal Brace: Thoracolumbosacral orthotic;Applied in sitting position    Mobility  Bed Mobility Overal bed mobility: Needs Assistance Bed Mobility: Sit to Sidelying         Sit to sidelying: Min assist General bed mobility comments: Min assist to help lift legs into bed to side lying.  Pt was able to report proper reverse log roll technique.   Transfers Overall transfer level: Needs assistance Equipment used: Rolling walker (2 wheeled) Transfers: Sit to/from Stand Sit to Stand: Min assist         General transfer comment: Min assist from recliner, using arm rests.  Pt is able to do this with less support at his trunk today.  Verbal cues still needed for safety and assist to control descent when going to sit down.   Ambulation/Gait Ambulation/Gait assistance: +2 physical assistance;Min assist Ambulation Distance (Feet): 130 Feet Assistive device: Rolling walker (2 wheeled) Gait Pattern/deviations: Step-through pattern;Decreased weight shift to left (flexed  knee left with fatigue) Gait velocity: better speed control for safety   General Gait Details: Pt with better speed and gait safety today.  Chair to follow for increased safety.  Pt did show signs of fatigue in his left leg with increased left knee flexion on second half of walk.  Also, of note, pt's right foot is externally rotated more than his left which wife and daughter report was not there before.           Balance Overall balance assessment: Needs assistance Sitting-balance support: Feet supported;Bilateral upper extremity supported Sitting balance-Leahy Scale: Fair     Standing balance support: Bilateral upper extremity supported Standing balance-Leahy Scale: Poor                              Cognition Arousal/Alertness: Awake/alert Behavior During Therapy: WFL for tasks assessed/performed Overall Cognitive Status: Within Functional Limits for tasks assessed                                 General Comments: Much better today than previous sessions.               Pertinent Vitals/Pain Pain Assessment: 0-10 Pain Score: 2  Pain Location: back Pain Descriptors / Indicators: Discomfort;Grimacing Pain Intervention(s): Limited activity within patient's tolerance;Monitored during session;Repositioned           PT Goals (current goals can now be found in the care plan section) Acute Rehab PT Goals Patient Stated Goal: to get  better consistantly Progress towards PT goals: Progressing toward goals    Frequency    Min 5X/week      PT Plan Current plan remains appropriate       AM-PAC PT "6 Clicks" Daily Activity  Outcome Measure  Difficulty turning over in bed (including adjusting bedclothes, sheets and blankets)?: Unable Difficulty moving from lying on back to sitting on the side of the bed? : Unable Difficulty sitting down on and standing up from a chair with arms (e.g., wheelchair, bedside commode, etc,.)?: Unable Help needed  moving to and from a bed to chair (including a wheelchair)?: A Little Help needed walking in hospital room?: A Little Help needed climbing 3-5 steps with a railing? : A Lot 6 Click Score: 11    End of Session Equipment Utilized During Treatment: Back brace;Gait belt Activity Tolerance: Patient limited by pain;Patient limited by fatigue Patient left: in bed;with call bell/phone within reach;with family/visitor present Nurse Communication: Mobility status PT Visit Diagnosis: Other symptoms and signs involving the nervous system (R29.898);Pain Pain - Right/Left:  (lower) Pain - part of body:  (back)     Time: 1610-9604 PT Time Calculation (min) (ACUTE ONLY): 28 min  Charges:  $Gait Training: 8-22 mins $Therapeutic Activity: 8-22 mins          Jamorian Dimaria B. Royale Swamy, PT, DPT [phone]            05/30/2017, 11:38 AM

## 2017-05-30 NOTE — Progress Notes (Signed)
Report called to [facility]. Family aware of pending discharge. Will continue to monitor. Lianne Bushy RN BSN.

## 2017-05-30 NOTE — Progress Notes (Signed)
Performance yesterday limited due to medications I think he will do better today and hopefully therapy will recommend home therapy Moving legs well Will prepare his discharge paperwork in case therapy clears him to go home

## 2017-05-30 NOTE — Clinical Social Work Placement (Addendum)
   CLINICAL SOCIAL WORK PLACEMENT  NOTE  Date:  05/30/2017  Patient Details  Name: Glen [name] MRN: [id number] Date of Birth: January 24, 1936  Clinical Social Work is seeking post-discharge placement for this patient at the [place] level of care (*CSW will initial, date and re-position this form in  chart as items are completed):  No (Family provided CSW with facility preference)   Patient/family provided with [place] Work Department's list of facilities offering this level of care within the geographic area requested by the patient (or if unable, by the patient's family).  Yes   Patient/family informed of their freedom to choose among providers that offer the needed level of care, that participate in Medicare, Medicaid or managed care program needed by the patient, have an available bed and are willing to accept the patient.  No   Patient/family informed of [facility]'s ownership interest in [facility] and [facility], as well as of the fact that they are under no obligation to receive care at these facilities.  PASRR submitted to [employer] on  (Patient going to facility in [place], [place] )     [place] number received on       Existing PASRR number confirmed on       FL2 transmitted to all facilities in geographic area requested by pt/family on 05/30/17     FL2 transmitted to all facilities within larger geographic area on       Patient informed that his/her managed care company has contracts with or will negotiate with certain facilities, including the following:         YES - Patient/family informed of bed offers received - (family provided CSW with preference).  Patient chooses bed at   [facility] in [place], [place]    Physician recommends and patient chooses bed at      Patient to be transferred to  [facility] on  05/30/17.  Patient to be transferred to facility by  family  Patient family notified on  05/30/17 of  transfer.  Name of family member notified:   Linda-wife and Lisa-daughter-in-law     PHYSICIAN       Additional Comment:    _______________________________________________ Sable Feil, LCSW 05/30/2017, 12:53 PM

## 2017-05-30 NOTE — Clinical Social Work Note (Signed)
Clinical Social Work Assessment  Patient Details  Name: Glen [name] MRN: [id number] Date of Birth: 04/01/1936  Date of referral:  05/30/17               Reason for consult:  Facility Placement                Permission sought to share information with:  Family Supports Permission granted to share information::  Yes, Verbal Permission Granted  Name::     Maximillian Habibi and Edmon Crape  Agency::     Relationship::  Wife and daugher-in-law  Sport and exercise psychologist Information:  Lattie Haw [phone] - mobile  Housing/Transportation Living arrangements for the past 2 months:  [place] of Information:  Patient, Spouse, Other (Comment Required) (Daughter-in-law Lattie Haw) Patient Interpreter Needed:  None Criminal Activity/Legal Involvement Pertinent to Current Situation/Hospitalization:  No - Comment as needed Significant Relationships:  Spouse, Adult Children, Other(Comment) Lives with:  Spouse Do you feel safe going back to the place where you live?  No (Patient and family agreeable to [facility] rehab) Need for family participation in patient care:  Yes (Comment)  Care giving concerns:  Patient, wife and daughter-in-law in agreement with ST rehab prior to patient returning home. Daughter-in-law Lattie Haw made contact with [facility] in [place] and provided CSW with contact information.  Social Worker assessment / plan:  CSW talked with patient and family at the bedside regarding discharge disposition and recommendation of ST rehab. Mr. Snelson was sitting up in bed and was alert, oriented, pleasant and engaged easily with CSW in conversation. Patient's wife and daughter-in-law were also very pleasant and happy to talk with CSW regarding patient's discharge. Family aware that patient needs ST rehab and CSW advised that he has never been to rehab before or been in the hospital before. Patient's daughter-in-law Lattie Haw talked with admissions staff at facility and CSW advised that they have beds and can  accept patient, however clinicals needed from hospital. CSW advised family that contact will be made with facility, clinicals transmitted and they will be advised when everything is in place with facility so that they can transport patient to facility.  Employment status:  Retired Health visitor, Managed Care PT Recommendations:  [place] / Referral to community resources:  [place] (Family provided CSW with facility preference in [place], [place])  Patient/Family's Response to care: Patient nor family expressed no concerns regarding patient's care during hospitalization.  Patient/Family's Understanding of and Emotional Response to Diagnosis, Current Treatment, and Prognosis:  Family aware of patient's medical condition, and need for hospitalization and rehab.  Emotional Assessment Appearance:  Appears stated age Attitude/Demeanor/Rapport:  Other (Appropriate) Affect (typically observed):  Appropriate, Pleasant Orientation:  Oriented to Self, Oriented to Place, Oriented to  Time, Oriented to Situation Alcohol / Substance use:  Tobacco Use, Alcohol Use, Illicit Drugs (Patient reported that he quit smoking, 7 Shots of liquor per week, and does not use illicit drugs) Psych involvement (Current and /or in the community):  No (Comment)  Discharge Needs  Concerns to be addressed:  Discharge Planning Concerns Readmission within the last 30 days:  No Current discharge risk:  None Barriers to Discharge:  No Barriers Identified   Sable Feil, LCSW 05/30/2017, 12:40 PM

## 2017-05-31 ENCOUNTER — Inpatient Hospital Stay (HOSPITAL_COMMUNITY): Payer: Medicare Other | Admitting: Certified Registered Nurse Anesthetist

## 2017-05-31 ENCOUNTER — Inpatient Hospital Stay (HOSPITAL_COMMUNITY)
Admission: AD | Admit: 2017-05-31 | Discharge: 2017-06-04 | DRG: 920 | Disposition: A | Discharge status: Home Health | Payer: Medicare Other | Source: Other Acute Inpatient Hospital | Attending: Neurological Surgery | Admitting: Neurological Surgery

## 2017-05-31 ENCOUNTER — Inpatient Hospital Stay (HOSPITAL_COMMUNITY)
Admission: AD | Disposition: A | Discharge status: Home Health | Payer: Self-pay | Source: Other Acute Inpatient Hospital | Attending: Neurological Surgery

## 2017-05-31 DIAGNOSIS — E785 Hyperlipidemia, unspecified: Secondary | ICD-10-CM | POA: Diagnosis present

## 2017-05-31 DIAGNOSIS — T8130XA Disruption of wound, unspecified, initial encounter: Secondary | ICD-10-CM | POA: Diagnosis present

## 2017-05-31 DIAGNOSIS — D649 Anemia, unspecified: Secondary | ICD-10-CM

## 2017-05-31 DIAGNOSIS — R000 Tachycardia, unspecified: Secondary | ICD-10-CM

## 2017-05-31 DIAGNOSIS — I4891 Unspecified atrial fibrillation: Secondary | ICD-10-CM | POA: Diagnosis present

## 2017-05-31 DIAGNOSIS — K219 Gastro-esophageal reflux disease without esophagitis: Secondary | ICD-10-CM | POA: Diagnosis present

## 2017-05-31 DIAGNOSIS — T8131XA Disruption of external operation (surgical) wound, not elsewhere classified, initial encounter: Principal | ICD-10-CM | POA: Diagnosis present

## 2017-05-31 DIAGNOSIS — D62 Acute posthemorrhagic anemia: Secondary | ICD-10-CM | POA: Diagnosis present

## 2017-05-31 DIAGNOSIS — Z981 Arthrodesis status: Secondary | ICD-10-CM

## 2017-05-31 DIAGNOSIS — I480 Paroxysmal atrial fibrillation: Secondary | ICD-10-CM

## 2017-05-31 DIAGNOSIS — Y838 Other surgical procedures as the cause of abnormal reaction of the patient, or of later complication, without mention of misadventure at the time of the procedure: Secondary | ICD-10-CM | POA: Diagnosis present

## 2017-05-31 DIAGNOSIS — I4892 Unspecified atrial flutter: Secondary | ICD-10-CM | POA: Diagnosis present

## 2017-05-31 DIAGNOSIS — Z87891 Personal history of nicotine dependence: Secondary | ICD-10-CM

## 2017-05-31 DIAGNOSIS — G8918 Other acute postprocedural pain: Secondary | ICD-10-CM

## 2017-05-31 DIAGNOSIS — E871 Hypo-osmolality and hyponatremia: Secondary | ICD-10-CM

## 2017-05-31 HISTORY — DX: Disruption of wound, unspecified, initial encounter: T81.30XA

## 2017-05-31 HISTORY — PX: LUMBAR WOUND DEBRIDEMENT: SHX1988

## 2017-05-31 LAB — POCT I-STAT, CHEM 8
BUN: 15 mg/dL (ref 6–20)
Calcium, Ion: 1.02 mmol/L — ABNORMAL LOW (ref 1.15–1.40)
Chloride: 103 mmol/L (ref 101–111)
Creatinine, Ser: 0.9 mg/dL (ref 0.61–1.24)
GLUCOSE: 111 mg/dL — AB (ref 65–99)
HEMATOCRIT: 22 % — AB (ref 39.0–52.0)
HEMOGLOBIN: 7.5 g/dL — AB (ref 13.0–17.0)
POTASSIUM: 4.3 mmol/L (ref 3.5–5.1)
SODIUM: 138 mmol/L (ref 135–145)
TCO2: 25 mmol/L (ref 22–32)

## 2017-05-31 LAB — CBC
HEMATOCRIT: 24.4 % — AB (ref 39.0–52.0)
HEMATOCRIT: 28.5 % — AB (ref 39.0–52.0)
HEMOGLOBIN: 8.1 g/dL — AB (ref 13.0–17.0)
HEMOGLOBIN: 9.3 g/dL — AB (ref 13.0–17.0)
MCH: 29.9 pg (ref 26.0–34.0)
MCH: 29 pg (ref 26.0–34.0)
MCHC: 33.2 g/dL (ref 30.0–36.0)
MCHC: 32.6 g/dL (ref 30.0–36.0)
MCV: 90 fL (ref 78.0–100.0)
MCV: 88.8 fL (ref 78.0–100.0)
Platelets: 164 10*3/uL (ref 150–400)
Platelets: 155 10*3/uL (ref 150–400)
RBC: 2.71 MIL/uL — ABNORMAL LOW (ref 4.22–5.81)
RBC: 3.21 MIL/uL — ABNORMAL LOW (ref 4.22–5.81)
RDW: 14.6 % (ref 11.5–15.5)
RDW: 14.4 % (ref 11.5–15.5)
WBC: 8 10*3/uL (ref 4.0–10.5)
WBC: 7.7 10*3/uL (ref 4.0–10.5)

## 2017-05-31 LAB — BASIC METABOLIC PANEL
ANION GAP: 7 (ref 5–15)
BUN: 14 mg/dL (ref 6–20)
CALCIUM: 8.1 mg/dL — AB (ref 8.9–10.3)
CHLORIDE: 105 mmol/L (ref 101–111)
CO2: 27 mmol/L (ref 22–32)
CREATININE: 0.96 mg/dL (ref 0.61–1.24)
GFR calc non Af Amer: >60 mL/min (ref >60)
Glucose, Bld: 110 mg/dL — ABNORMAL HIGH (ref 65–99)
Potassium: 4.4 mmol/L (ref 3.5–5.1)
SODIUM: 139 mmol/L (ref 135–145)

## 2017-05-31 LAB — CK TOTAL AND CKMB (NOT AT ARMC)
CK, MB: 2.9 ng/mL (ref 0.5–5.0)
RELATIVE INDEX: 1.1 (ref 0.0–2.5)
Total CK: 269 U/L (ref 49–397)

## 2017-05-31 LAB — TYPE AND SCREEN
ABO/RH(D): B POS
ANTIBODY SCREEN: NEGATIVE

## 2017-05-31 LAB — PREPARE RBC (CROSSMATCH)

## 2017-05-31 LAB — TROPONIN I: Troponin I: <0.03 ng/mL (ref <0.03)

## 2017-05-31 SURGERY — LUMBAR WOUND DEBRIDEMENT
Anesthesia: General | Site: Back

## 2017-05-31 MED ORDER — OXYCODONE-ACETAMINOPHEN 7.5-325 MG PO TABS: 1.0000 | ORAL_TABLET | ORAL | Status: DC | PRN

## 2017-05-31 MED ORDER — METHOCARBAMOL 1000 MG/10ML IJ SOLN
500.0000 mg | Freq: Four times a day (QID) | INTRAVENOUS | Status: DC | PRN
  Administered 2017-05-31: 500 mg via INTRAVENOUS
  Filled 2017-05-31: qty 5

## 2017-05-31 MED ORDER — ONDANSETRON HCL 4 MG/2ML IJ SOLN
INTRAMUSCULAR | Status: DC | PRN
  Administered 2017-05-31: 4 mg via INTRAVENOUS

## 2017-05-31 MED ORDER — OXYCODONE HCL 5 MG PO TABS: 5.0000 mg | ORAL_TABLET | Freq: Once | ORAL | Status: DC | PRN

## 2017-05-31 MED ORDER — PHENYLEPHRINE HCL 10 MG/ML IJ SOLN
INTRAMUSCULAR | Status: DC | PRN
  Administered 2017-05-31: 80 ug via INTRAVENOUS

## 2017-05-31 MED ORDER — SENNA 8.6 MG PO TABS
1.0000 | ORAL_TABLET | Freq: Two times a day (BID) | ORAL | Status: DC
  Administered 2017-06-01 – 2017-06-04 (×6): 8.6 mg via ORAL
  Filled 2017-05-31 (×6): qty 1

## 2017-05-31 MED ORDER — ACETAMINOPHEN 650 MG RE SUPP: 650.0000 mg | RECTAL | Status: DC | PRN

## 2017-05-31 MED ORDER — VITAMIN C 500 MG PO TABS
500.0000 mg | ORAL_TABLET | Freq: Every day | ORAL | Status: DC
  Administered 2017-06-01 – 2017-06-02 (×2): 500 mg via ORAL
  Filled 2017-05-31 (×2): qty 1

## 2017-05-31 MED ORDER — PHENOL 1.4 % MT LIQD: 1.0000 | OROMUCOSAL | Status: DC | PRN

## 2017-05-31 MED ORDER — FENTANYL CITRATE (PF) 100 MCG/2ML IJ SOLN
INTRAMUSCULAR | Status: AC
  Administered 2017-05-31: 50 ug via INTRAVENOUS
  Filled 2017-05-31: qty 2

## 2017-05-31 MED ORDER — VITAMIN D 1000 UNITS PO TABS
1000.0000 [IU] | ORAL_TABLET | Freq: Every day | ORAL | Status: DC
  Administered 2017-06-01 – 2017-06-02 (×2): 1000 [IU] via ORAL
  Filled 2017-05-31 (×2): qty 1

## 2017-05-31 MED ORDER — SODIUM CHLORIDE 0.9 % IV SOLN: INTRAVENOUS | Status: DC

## 2017-05-31 MED ORDER — METHOCARBAMOL 500 MG PO TABS
500.0000 mg | ORAL_TABLET | Freq: Four times a day (QID) | ORAL | Status: DC | PRN
  Administered 2017-06-02 (×2): 500 mg via ORAL
  Filled 2017-05-31 (×3): qty 1

## 2017-05-31 MED ORDER — LIDOCAINE HCL (CARDIAC) 20 MG/ML IV SOLN
INTRAVENOUS | Status: DC | PRN
  Administered 2017-05-31: 40 mg via INTRAVENOUS

## 2017-05-31 MED ORDER — SODIUM CHLORIDE 0.9% FLUSH: 3.0000 mL | INTRAVENOUS | Status: DC | PRN

## 2017-05-31 MED ORDER — FENTANYL CITRATE (PF) 100 MCG/2ML IJ SOLN
INTRAMUSCULAR | Status: DC | PRN
Start: 2017-05-31 — End: 2017-05-31
  Administered 2017-05-31: 50 ug via INTRAVENOUS
  Administered 2017-05-31: 100 ug via INTRAVENOUS
  Administered 2017-05-31: 25 ug via INTRAVENOUS

## 2017-05-31 MED ORDER — SODIUM CHLORIDE 0.9 % IV SOLN: 250.0000 mL | INTRAVENOUS | Status: DC

## 2017-05-31 MED ORDER — PROPOFOL 10 MG/ML IV BOLUS
INTRAVENOUS | Status: DC | PRN
Start: 2017-05-31 — End: 2017-05-31
  Administered 2017-05-31: 130 mg via INTRAVENOUS

## 2017-05-31 MED ORDER — VANCOMYCIN HCL 1000 MG IV SOLR
INTRAVENOUS | Status: DC | PRN
  Administered 2017-05-31: 1000 mg

## 2017-05-31 MED ORDER — ONDANSETRON HCL 4 MG/2ML IJ SOLN: 4.0000 mg | Freq: Once | INTRAMUSCULAR | Status: DC | PRN

## 2017-05-31 MED ORDER — VANCOMYCIN HCL 1000 MG IV SOLR
INTRAVENOUS | Status: AC
  Filled 2017-05-31: qty 1000

## 2017-05-31 MED ORDER — PHENYLEPHRINE HCL 10 MG/ML IJ SOLN
INTRAVENOUS | Status: DC | PRN
  Administered 2017-05-31: 25 ug/min via INTRAVENOUS

## 2017-05-31 MED ORDER — FENTANYL CITRATE (PF) 250 MCG/5ML IJ SOLN
INTRAMUSCULAR | Status: AC
  Filled 2017-05-31: qty 5

## 2017-05-31 MED ORDER — LACTATED RINGERS IV SOLN
INTRAVENOUS | Status: DC | PRN
  Administered 2017-05-31: 16:00:00 via INTRAVENOUS

## 2017-05-31 MED ORDER — ONDANSETRON HCL 4 MG/2ML IJ SOLN
4.0000 mg | Freq: Four times a day (QID) | INTRAMUSCULAR | Status: DC | PRN
  Filled 2017-05-31: qty 2

## 2017-05-31 MED ORDER — OXYCODONE HCL 5 MG/5ML PO SOLN: 5.0000 mg | Freq: Once | ORAL | Status: DC | PRN

## 2017-05-31 MED ORDER — VANCOMYCIN HCL IN DEXTROSE 1-5 GM/200ML-% IV SOLN
1000.0000 mg | Freq: Once | INTRAVENOUS | Status: AC
  Administered 2017-05-31: 1000 mg via INTRAVENOUS
  Filled 2017-05-31: qty 200

## 2017-05-31 MED ORDER — ADULT MULTIVITAMIN W/MINERALS CH
1.0000 | ORAL_TABLET | Freq: Every day | ORAL | Status: DC
  Administered 2017-06-01 – 2017-06-02 (×2): 1 via ORAL
  Filled 2017-05-31 (×2): qty 1

## 2017-05-31 MED ORDER — ONDANSETRON HCL 4 MG PO TABS: 4.0000 mg | ORAL_TABLET | Freq: Four times a day (QID) | ORAL | Status: DC | PRN

## 2017-05-31 MED ORDER — FENTANYL CITRATE (PF) 100 MCG/2ML IJ SOLN
25.0000 ug | INTRAMUSCULAR | Status: DC | PRN
  Administered 2017-05-31: 25 ug via INTRAVENOUS
  Administered 2017-05-31 (×2): 50 ug via INTRAVENOUS
  Administered 2017-05-31: 25 ug via INTRAVENOUS

## 2017-05-31 MED ORDER — DEXTROSE 5 % IV SOLN
2.0000 g | Freq: Once | INTRAVENOUS | Status: AC
  Administered 2017-05-31: 2 g via INTRAVENOUS
  Filled 2017-05-31: qty 2

## 2017-05-31 MED ORDER — MORPHINE SULFATE (PF) 4 MG/ML IV SOLN
0.5000 mg | INTRAVENOUS | Status: DC | PRN
  Administered 2017-05-31: 1 mg via INTRAVENOUS
  Filled 2017-05-31: qty 1

## 2017-05-31 MED ORDER — MORPHINE SULFATE (PF) 4 MG/ML IV SOLN: 0.5000 mg | INTRAVENOUS | Status: DC | PRN

## 2017-05-31 MED ORDER — ROCURONIUM BROMIDE 100 MG/10ML IV SOLN
INTRAVENOUS | Status: DC | PRN
  Administered 2017-05-31: 50 mg via INTRAVENOUS

## 2017-05-31 MED ORDER — SUGAMMADEX SODIUM 200 MG/2ML IV SOLN
INTRAVENOUS | Status: DC | PRN
  Administered 2017-05-31: 164.4 mg via INTRAVENOUS

## 2017-05-31 MED ORDER — ACETAMINOPHEN 325 MG PO TABS
650.0000 mg | ORAL_TABLET | ORAL | Status: DC | PRN
  Administered 2017-05-31 – 2017-06-04 (×12): 650 mg via ORAL
  Filled 2017-05-31 (×12): qty 2

## 2017-05-31 MED ORDER — MORPHINE SULFATE (PF) 4 MG/ML IV SOLN: 2.0000 mg | INTRAVENOUS | Status: DC | PRN

## 2017-05-31 MED ORDER — PROPOFOL 10 MG/ML IV BOLUS
INTRAVENOUS | Status: AC
  Filled 2017-05-31: qty 20

## 2017-05-31 MED ORDER — THROMBIN 20000 UNITS EX KIT
PACK | CUTANEOUS | Status: AC
  Filled 2017-05-31: qty 1

## 2017-05-31 MED ORDER — OXYCODONE HCL 5 MG PO TABS
2.5000 mg | ORAL_TABLET | ORAL | Status: DC | PRN
Start: 2017-05-31 — End: 2017-06-04
  Administered 2017-06-01 (×2): 2.5 mg via ORAL
  Administered 2017-06-02: 5 mg via ORAL
  Administered 2017-06-03: 2.5 mg via ORAL
  Filled 2017-05-31 (×6): qty 1

## 2017-05-31 MED ORDER — SODIUM CHLORIDE 0.9 % IR SOLN
Status: DC | PRN
  Administered 2017-05-31 (×2): 500 mL

## 2017-05-31 MED ORDER — VITAMIN B-12 1000 MCG PO TABS
1000.0000 ug | ORAL_TABLET | Freq: Every day | ORAL | Status: DC
  Administered 2017-06-01 – 2017-06-02 (×2): 1000 ug via ORAL
  Filled 2017-05-31 (×2): qty 1

## 2017-05-31 MED ORDER — VANCOMYCIN HCL IN DEXTROSE 1-5 GM/200ML-% IV SOLN
INTRAVENOUS | Status: AC
  Filled 2017-05-31: qty 200

## 2017-05-31 MED ORDER — CEFAZOLIN SODIUM-DEXTROSE 2-4 GM/100ML-% IV SOLN
2.0000 g | Freq: Three times a day (TID) | INTRAVENOUS | Status: AC
  Administered 2017-05-31 – 2017-06-01 (×2): 2 g via INTRAVENOUS
  Filled 2017-05-31 (×2): qty 100

## 2017-05-31 MED ORDER — THROMBIN 20000 UNITS EX SOLR
CUTANEOUS | Status: DC | PRN
  Administered 2017-05-31: 20 mL via TOPICAL

## 2017-05-31 MED ORDER — SODIUM CHLORIDE 0.9% FLUSH
3.0000 mL | Freq: Two times a day (BID) | INTRAVENOUS | Status: DC
  Administered 2017-05-31 – 2017-06-01 (×3): 3 mL via INTRAVENOUS

## 2017-05-31 MED ORDER — MENTHOL 3 MG MT LOZG: 1.0000 | LOZENGE | OROMUCOSAL | Status: DC | PRN

## 2017-05-31 MED ORDER — POTASSIUM CHLORIDE IN NACL 20-0.9 MEQ/L-% IV SOLN: INTRAVENOUS | Status: DC

## 2017-05-31 MED ORDER — 0.9 % SODIUM CHLORIDE (POUR BTL) OPTIME
TOPICAL | Status: DC | PRN
  Administered 2017-05-31: 1000 mL

## 2017-05-31 MED ORDER — CELECOXIB 200 MG PO CAPS
200.0000 mg | ORAL_CAPSULE | Freq: Two times a day (BID) | ORAL | Status: DC
  Administered 2017-05-31 – 2017-06-04 (×8): 200 mg via ORAL
  Filled 2017-05-31 (×8): qty 1

## 2017-05-31 MED ORDER — OXYCODONE-ACETAMINOPHEN 5-325 MG PO TABS
1.0000 | ORAL_TABLET | ORAL | Status: DC | PRN
  Administered 2017-06-03: 1 via ORAL
  Filled 2017-05-31 (×2): qty 1

## 2017-05-31 MED ORDER — GABAPENTIN 300 MG PO CAPS
300.0000 mg | ORAL_CAPSULE | Freq: Three times a day (TID) | ORAL | Status: DC
  Filled 2017-05-31: qty 1

## 2017-05-31 SURGICAL SUPPLY — 48 items
BAG DECANTER FOR FLEXI CONT (MISCELLANEOUS) ×6 IMPLANT
BENZOIN TINCTURE PRP APPL 2/3 (GAUZE/BANDAGES/DRESSINGS) ×6 IMPLANT
CANISTER SUCT 3000ML PPV (MISCELLANEOUS) ×3 IMPLANT
CARTRIDGE OIL MAESTRO DRILL (MISCELLANEOUS) ×1 IMPLANT
CLOSURE STERI-STRIP 1/2X4 (GAUZE/BANDAGES/DRESSINGS) ×3
CLOSURE WOUND 1/2 X4 (GAUZE/BANDAGES/DRESSINGS) ×1
CLSR STERI-STRIP ANTIMIC 1/2X4 (GAUZE/BANDAGES/DRESSINGS) ×6 IMPLANT
DIFFUSER DRILL AIR PNEUMATIC (MISCELLANEOUS) ×3 IMPLANT
DRAPE LAPAROTOMY 100X72X124 (DRAPES) ×3 IMPLANT
DRAPE POUCH INSTRU U-SHP 10X18 (DRAPES) ×3 IMPLANT
DRSG OPSITE 4X5.5 SM (GAUZE/BANDAGES/DRESSINGS) ×3 IMPLANT
DRSG OPSITE POSTOP 4X10 (GAUZE/BANDAGES/DRESSINGS) ×3 IMPLANT
DURAPREP 26ML APPLICATOR (WOUND CARE) IMPLANT
DURAPREP 6ML APPLICATOR 50/CS (WOUND CARE) IMPLANT
ELECT REM PT RETURN 9FT ADLT (ELECTROSURGICAL) ×3
ELECTRODE REM PT RTRN 9FT ADLT (ELECTROSURGICAL) ×1 IMPLANT
GAUZE SPONGE 4X4 16PLY XRAY LF (GAUZE/BANDAGES/DRESSINGS) IMPLANT
GLOVE BIO SURGEON STRL SZ7 (GLOVE) ×9 IMPLANT
GLOVE BIO SURGEON STRL SZ8 (GLOVE) ×3 IMPLANT
GLOVE BIOGEL PI IND STRL 7.0 (GLOVE) ×1 IMPLANT
GLOVE BIOGEL PI IND STRL 7.5 (GLOVE) ×1 IMPLANT
GLOVE BIOGEL PI INDICATOR 7.0 (GLOVE) ×2
GLOVE BIOGEL PI INDICATOR 7.5 (GLOVE) ×2
GOWN STRL REUS W/ TWL LRG LVL3 (GOWN DISPOSABLE) ×2 IMPLANT
GOWN STRL REUS W/ TWL XL LVL3 (GOWN DISPOSABLE) IMPLANT
GOWN STRL REUS W/TWL 2XL LVL3 (GOWN DISPOSABLE) ×3 IMPLANT
GOWN STRL REUS W/TWL LRG LVL3 (GOWN DISPOSABLE) ×4
GOWN STRL REUS W/TWL XL LVL3 (GOWN DISPOSABLE)
KIT BASIN OR (CUSTOM PROCEDURE TRAY) ×3 IMPLANT
KIT ROOM TURNOVER OR (KITS) ×3 IMPLANT
NEEDLE HYPO 18GX1.5 BLUNT FILL (NEEDLE) IMPLANT
NEEDLE HYPO 25X1 1.5 SAFETY (NEEDLE) ×3 IMPLANT
NEEDLE SPNL 20GX3.5 QUINCKE YW (NEEDLE) IMPLANT
NS IRRIG 1000ML POUR BTL (IV SOLUTION) ×3 IMPLANT
OIL CARTRIDGE MAESTRO DRILL (MISCELLANEOUS) ×3
PACK LAMINECTOMY NEURO (CUSTOM PROCEDURE TRAY) ×3 IMPLANT
PAD ARMBOARD 7.5X6 YLW CONV (MISCELLANEOUS) ×9 IMPLANT
STRIP CLOSURE SKIN 1/2X4 (GAUZE/BANDAGES/DRESSINGS) ×2 IMPLANT
SUT VIC AB 0 CT1 18XCR BRD8 (SUTURE) ×3 IMPLANT
SUT VIC AB 0 CT1 8-18 (SUTURE) ×6
SUT VIC AB 2-0 CP2 18 (SUTURE) ×6 IMPLANT
SUT VIC AB 3-0 SH 8-18 (SUTURE) ×18 IMPLANT
SWAB COLLECTION DEVICE MRSA (MISCELLANEOUS) ×3 IMPLANT
SWAB CULTURE ESWAB REG 1ML (MISCELLANEOUS) ×3 IMPLANT
SYR 3ML LL SCALE MARK (SYRINGE) IMPLANT
TOWEL GREEN STERILE (TOWEL DISPOSABLE) ×3 IMPLANT
TOWEL GREEN STERILE FF (TOWEL DISPOSABLE) ×3 IMPLANT
WATER STERILE IRR 1000ML POUR (IV SOLUTION) ×3 IMPLANT

## 2017-05-31 NOTE — Transfer of Care (Signed)
Immediate Anesthesia Transfer of Care Note  Patient: Glen [name]  Procedure(s) Performed: LUMBAR WOUND REVISION (N/A Back)  Patient Location: PACU  Anesthesia Type:General  Level of Consciousness: awake and alert   Airway & Oxygen Therapy: Patient Spontanous Breathing and Patient connected to nasal cannula oxygen  Post-op Assessment: Report given to RN and Post -op Vital signs reviewed and stable  Post vital signs: Reviewed and stable  Last Vitals:  Vitals:   05/31/17 1339 05/31/17 1715  BP: (!) 142/84 (!) (P) 142/130  Pulse: 81 (!) (P) 140  Resp: 20 (P) 20  Temp: 37.2 C (!) (P) 36.4 C  SpO2: 98% (P) 100%    Last Pain:  Vitals:   05/31/17 1339  TempSrc: Oral  PainSc: 3          Complications: No apparent anesthesia complications

## 2017-05-31 NOTE — H&P (Signed)
Subjective: Patient is a 81 y.o. male who comes from a rehab facility in [place]. He is 4 days postop from an L2-S1 lami for decompression  And L5-S1 interbody fusion with posterior segmental fixation by Dr. Cyndy Freeze. Transferred here from [facility] where the physician there stated that his wound dehisced this morning when he was up walking. Patient denies any leg pain or weakness. Reports mild back pain 2/10. Denies any fever or chills. Patient was tachycardic on arrival to the hospital and receiving one unit blood with a hgb of 6.1.  Past Medical History:  Diagnosis Date  . Allergy   . Arthritis   . Cataracts, bilateral    bil cataracts removed  . DDD (degenerative disc disease), lumbosacral    pain radiates down left leg  . Environmental and seasonal allergies   . GERD (gastroesophageal reflux disease)   . Hearing loss    bilateral hearing aids  . Heart murmur    recently  saw cardiologist and said it was fine   . Hx of adenomatous colonic polyps 04/23/2016  . Hyperlipidemia     Past Surgical History:  Procedure Laterality Date  . APPLICATION OF ROBOTIC ASSISTANCE FOR SPINAL PROCEDURE N/A 05/27/2017   Procedure: APPLICATION OF ROBOTIC ASSISTANCE FOR SPINAL PROCEDURE;  Surgeon: Ditty, Kevan Ny, MD;  Location: [facility];  Service: Neurosurgery;  Laterality: N/A;  . cataract     bilat  . CIRCUMCISION    . COLONOSCOPY    . COLONOSCOPY W/ POLYPECTOMY    . LAMINECTOMY  05/27/2017   SACRAL  . POLYPECTOMY    . TONSILLECTOMY    . wisdom teth extraction      No Known Allergies  Social History  Substance Use Topics  . Smoking status: Former Smoker    Packs/day: 1.00    Types: Cigarettes    Quit date: 08/02/2016  . Smokeless tobacco: Never Used  . Alcohol use 4.2 oz/week    7 Shots of liquor per week    Family History  Problem Relation Age of Onset  . Colon cancer Neg Hx   . Esophageal cancer Neg Hx   . Rectal cancer Neg Hx   . Stomach cancer Neg Hx   . Pancreatic cancer  Neg Hx   . Prostate cancer Neg Hx    Prior to Admission medications   Medication Sig Start Date End Date Taking? Authorizing Provider  atorvastatin (LIPITOR) 10 MG tablet Take 10 mg by mouth daily.    [provider]  Carboxymeth-Glycerin-Polysorb (REFRESH OPTIVE ADVANCED OP) Place 1 drop into both eyes 3 (three) times a week.    [provider]  Cholecalciferol (VITAMIN D3) 1000 units CAPS Take 1,000 Units by mouth daily.     [provider]  cyanocobalamin 1000 MCG tablet Take 1,000 mcg by mouth daily.    [provider]  gabapentin (NEURONTIN) 300 MG capsule Take 1 capsule (300 mg total) by mouth 3 (three) times daily. 05/30/17   Ditty, Kevan Ny, MD  Multiple Vitamin (MULTIVITAMIN) tablet Take 1 tablet by mouth daily. Senior formula    [provider]  Omega-3 Fatty Acids (FISH OIL PO) Take 1 capsule by mouth daily.    [provider]  OVER THE COUNTER MEDICATION Take 1 tablet by mouth daily. Super C Complex    [provider]  oxyCODONE-acetaminophen (PERCOCET) 7.5-325 MG tablet Take 1-2 tablets by mouth every 4 (four) hours as needed for severe pain. 05/30/17 05/30/18  Ditty, Kevan Ny, MD  vitamin C (ASCORBIC ACID) 500 MG tablet Take 500 mg by mouth daily.    [provider]     Review of Systems  Positive ROS: mild back pain  All other systems have been reviewed and were otherwise negative with the exception of those mentioned in the HPI and as above.  Objective: Vital signs in last 24 hours: Temp:  [99 F (37.2 C)] 99 F (37.2 C) (10/13 1339) Pulse Rate:  [81] 81 (10/13 1339) Resp:  [20] 20 (10/13 1339) BP: (142)/(84) 142/84 (10/13 1339) SpO2:  [98 %] 98 % (10/13 1339)  General Appearance: Alert, cooperative, no distress, appears stated age Head: Normocephalic, without obvious abnormality, atraumatic Eyes: PERRL, conjunctiva/corneas clear, EOM's intact, fundi benign, both eyes      Ears: Not  tested Throat: Lips, mucosa, and tongue normal Neck: Supple, symmetrical, trachea midline Back: Symmetric, no curvature, ROM normal, no CVA tenderness, large wound dehiscence.  Lungs: , respirations unlabored Heart: Regular rate and rhythm Abdomen: Not tested Extremities: Extremities normal, atraumatic, no cyanosis or edema Pulses: 2+ and symmetric all extremities Skin: Skin color, texture, turgor normal, no rashes or lesions  NEUROLOGIC:   Mental status: alert and oriented, no aphasia, good attention span, Fund of knowledge/ memory ok Motor Exam - grossly normal Sensory Exam - grossly normal Reflexes:  Coordination - grossly normal Gait - grossly normal Balance - not tested Cranial Nerves: I: smell Not tested  II: visual acuity  OS:na   OD: na  II: visual fields Full to confrontation  II: pupils Equal, round, reactive to light  III,VII: ptosis None  III,IV,VI: extraocular muscles  Full ROM  V: mastication Not tested  V: facial light touch sensation  Not tested  V,VII: corneal reflex  Not tested  VII: facial muscle function - upper  Not tested  VII: facial muscle function - lower Not tested  VIII: hearing Not tested  IX: soft palate elevation  Not tested  IX,X: gag reflex Present  XI: trapezius strength  5/5  XI: sternocleidomastoid strength 5/5  XI: neck flexion strength  5/5  XII: tongue strength  Normal    Data Review Lab Results  Component Value Date   WBC 18.4 (H) 05/28/2017   HGB 10.3 (L) 05/28/2017   HCT 31.3 (L) 05/28/2017   MCV 90.5 05/28/2017   PLT 128 (L) 05/28/2017   Lab Results  Component Value Date   NA 136 05/28/2017   K 5.1 05/28/2017   CL 104 05/28/2017   CO2 19 (L) 05/28/2017   BUN 19 05/28/2017   CREATININE 1.59 (H) 05/28/2017   GLUCOSE 257 (H) 05/28/2017   No results found for: INR, PROTIME  Assessment/Plan: Pleasant 81 year old accompanied by his wife and son presents today status post multi level lumbar decompression and fusion 4  days ago. He has been at a rehab facility where his wound dehisced this morning while up walking. We will plan to take him back to the OR for wound wash out and revision. All questions and concerns from the family answered to the best of my ability.    Ocie Cornfield Delaine Hernandez 05/31/2017 2:05 PM

## 2017-05-31 NOTE — OR Nursing (Signed)
Dr. Lamona Curl, cardiology, at beside in PACU to evaluate pt.  Reviewed 12 lead EKG and rhythm strips.  Will follow up with Dr. Linna Caprice.  No new orders at this time.  Continue to monitor.

## 2017-05-31 NOTE — Anesthesia Procedure Notes (Signed)
Procedure Name: Intubation Date/Time: 05/31/2017 3:42 PM Performed by: Clearnce Sorrel Pre-anesthesia Checklist: Patient identified, Emergency Drugs available, Suction available, Patient being monitored and Timeout performed Patient Re-evaluated:Patient Re-evaluated prior to induction Oxygen Delivery Method: Circle system utilized Preoxygenation: Pre-oxygenation with 100% oxygen Induction Type: IV induction Ventilation: Mask ventilation without difficulty and Oral airway inserted - appropriate to patient size Laryngoscope Size: Mac and 4 Grade View: Grade IV Tube type: Oral Tube size: 7.0 mm Number of attempts: 1 Airway Equipment and Method: Stylet Placement Confirmation: positive ETCO2 and breath sounds checked- equal and bilateral Secured at: 23 cm Tube secured with: Tape Dental Injury: Teeth and Oropharynx as per pre-operative assessment

## 2017-05-31 NOTE — Progress Notes (Signed)
Patient arrived to unit alert and oriented x 4, calm, cooperative. Patient transferred from bed to stretcher with no difficulties.Upon arrival patient infusing 1 unit RBCs at 30 mL/hr. Patient tachycardic, patient denies any SOB, chest pain or difficulty breathing. Patient states pain at surgical site is a 2/10 with no movement, gauze dressing in place unable to view wound, awaiting Dr. Ronnald Ramp arrival to bedside.  Dr. Ronnald Ramp paged. RN will continue to monitor patient.

## 2017-05-31 NOTE — Anesthesia Postprocedure Evaluation (Signed)
Anesthesia Post Note  Patient: Glen [name]  Procedure(s) Performed: LUMBAR WOUND REVISION (N/A Back)     Patient location during evaluation: PACU Anesthesia Type: General Level of consciousness: awake, awake and alert and oriented Pain management: pain level controlled Vital Signs Assessment: post-procedure vital signs reviewed and stable Respiratory status: spontaneous breathing, nonlabored ventilation and respiratory function stable Cardiovascular status: blood pressure returned to baseline Anesthetic complications: no    Last Vitals:  Vitals:   05/31/17 2000 05/31/17 2028  BP: (!) 166/94 (!) 157/78  Pulse: 95 (!) 112  Resp: 18 15  Temp: 36.8 C   SpO2: 95% 99%    Last Pain:  Vitals:   05/31/17 2100  TempSrc:   PainSc: Asleep                 Keiyana Stehr COKER

## 2017-05-31 NOTE — Anesthesia Preprocedure Evaluation (Addendum)
Anesthesia Evaluation  Patient identified by MRN, date of birth, ID band Patient awake    Reviewed: Allergy & Precautions, NPO status , Patient's Chart, lab work & pertinent test results  Airway Mallampati: II  TM Distance: >3 FB Neck ROM: Full    Dental  (+) Teeth Intact, Dental Advisory Given   Pulmonary former smoker,    breath sounds clear to auscultation       Cardiovascular  Rhythm:Regular Rate:Normal     Neuro/Psych    GI/Hepatic   Endo/Other    Renal/GU      Musculoskeletal   Abdominal   Peds  Hematology   Anesthesia Other Findings   Reproductive/Obstetrics                             Anesthesia Physical Anesthesia Plan  ASA: II and emergent  Anesthesia Plan: General   Post-op Pain Management:    Induction: Intravenous  PONV Risk Score and Plan: Ondansetron and Dexamethasone  Airway Management Planned: Oral ETT  Additional Equipment:   Intra-op Plan:   Post-operative Plan: Extubation in OR  Informed Consent: I have reviewed the patients History and Physical, chart, labs and discussed the procedure including the risks, benefits and alternatives for the proposed anesthesia with the patient or authorized representative who has indicated his/her understanding and acceptance.   Dental advisory given  Plan Discussed with: CRNA and Anesthesiologist  Anesthesia Plan Comments:         Anesthesia Quick Evaluation

## 2017-05-31 NOTE — Progress Notes (Signed)
Patient arrived to unit from PACU alert and oriented x 3, drowsy, agitated. Patient placed on telemetry, HR intermittently 140's, not sustaining, afib to ST. MD notified. PER NP 0.5-1 mg morphine q 4 hrs and PO tylenol for pain management. Patient repositioned, ice pack placed on surgical incision. RN will continue to monitor patient. Patient family updated and education provided on pain management and patients current treatment plan.

## 2017-05-31 NOTE — OR Nursing (Signed)
Lab at bedside to obtain CBC, CKMB and Troponin I.

## 2017-05-31 NOTE — OR Nursing (Signed)
Report called to Mardene Celeste, RN on 4N.

## 2017-05-31 NOTE — OR Nursing (Signed)
Dr. Linna Caprice at bedside, 12 lead EKG obtained.  Dr. Linna Caprice administered Precedex IV in PACU.

## 2017-05-31 NOTE — Progress Notes (Signed)
Anesthesiology Note:  81 year old male with no previous cardiac history transferred from [facility] after developing lumbar wound dehiscence. Patient underwent lumbar L2- S1 decompression and fusion on 10/9. Discharged yesterday to Rehab center and developed wound dehiscence today. Brought to  [facility] found to have hemoglobin 7.0 gm, given one unit PRBCs and transferred to [facility].  In Pre-op patient awake and alert, noted to have irregular rhythm by auscultation. In OR on ECG monitor noted to be in afib/flutter. HR 80-90 bpm. BP stable throughout. H/H 8.1/24 patient given 2 units PRBCs in OR.  In PACU, patient agitated, moderately confused.  VS: T- 36.7 HR- 130 RR- 23 O2 Sat 99% on 2L Heart- Tachycardia Lungs- Clear  12 lead ECG narrow complex tachycardia HR 140, rhythm strips show afib/flutter.  Impression: New onset afib/flutter of undetermined duration in 81 year old man with lumbar dehishence. Patient hemodynamically stable. HR now 100-110.  Plan:  1. Cardiology Consult 2. Troponin I x3 3.Telemetry  Bed 4. Hold anticoagulants   Roberts Gaudy

## 2017-05-31 NOTE — Consult Note (Signed)
Reason for Consult: tachycardia   Requesting Physician/Service: Dr. Roberts Gaudy   PCP:  Rollen Sox, MD Primary Cardiologist:N/A  HPI:  81 year old Glen [name] without CV co morbidities post op tonight for wound dehiscence revision with tachycardia.  Recent extensive L2-S1 lami for decompression  And L5-S1 interbody fusion with posterior segmental fixation by Dr. Cyndy Freeze in [place].  This per report required blood transfusions.  In rehab, he dehisced his wound prompting surgery today.  Earlier today, HGB 6.1 at OSH; at [facility] 7.5 now getting 2 U PRBc. Post/Intra Op it was noted that he went into likely flutter in the 140s.   On exam, confused, recovering from anesthesia but denies chest pain, shortness of breath.  Per PACU- did complain of some chest pain.  Tele shows regular narrow complex tachycardia rate 140s.    ECG rate 140s, narrow-- mostly regular with mild irregularity and diffuse mild ST depressions.  No obvious flutter or sinus P waves.  I reviewed one tele strip in the chart more convincing for a fibrillation.   Previous Cardiac Studies: N/A  Past Medical History:  Diagnosis Date  . Allergy   . Arthritis   . Cataracts, bilateral    bil cataracts removed  . DDD (degenerative disc disease), lumbosacral    pain radiates down left leg  . Environmental and seasonal allergies   . GERD (gastroesophageal reflux disease)   . Hearing loss    bilateral hearing aids  . Heart murmur    recently  saw cardiologist and said it was fine   . Hx of adenomatous colonic polyps 04/23/2016  . Hyperlipidemia     Past Surgical History:  Procedure Laterality Date  . APPLICATION OF ROBOTIC ASSISTANCE FOR SPINAL PROCEDURE N/A 05/27/2017   Procedure: APPLICATION OF ROBOTIC ASSISTANCE FOR SPINAL PROCEDURE;  Surgeon: Ditty, Kevan Ny, MD;  Location: [place];  Service: Neurosurgery;  Laterality: N/A;  . cataract     bilat  . CIRCUMCISION    . COLONOSCOPY    . COLONOSCOPY W/ POLYPECTOMY      . LAMINECTOMY  05/27/2017   SACRAL  . POLYPECTOMY    . TONSILLECTOMY    . wisdom teth extraction      Family History  Problem Relation Age of Onset  . Colon cancer Neg Hx   . Esophageal cancer Neg Hx   . Rectal cancer Neg Hx   . Stomach cancer Neg Hx   . Pancreatic cancer Neg Hx   . Prostate cancer Neg Hx    Social History:  reports that he quit smoking about 9 months ago. His smoking use included Cigarettes. He smoked 1.00 pack per day. He has never used smokeless tobacco. He reports that he drinks about 4.2 oz of alcohol per week . He reports that he does not use drugs.  Allergies: No Known Allergies  No current facility-administered medications on file prior to encounter.    Current Outpatient Prescriptions on File Prior to Encounter  Medication Sig Dispense Refill  . atorvastatin (LIPITOR) 10 MG tablet Take 10 mg by mouth daily.    . Carboxymeth-Glycerin-Polysorb (REFRESH OPTIVE ADVANCED OP) Place 1 drop into both eyes 3 (three) times a week.    . Cholecalciferol (VITAMIN D3) 1000 units CAPS Take 1,000 Units by mouth daily.     . cyanocobalamin 1000 MCG tablet Take 1,000 mcg by mouth daily.    Marland Kitchen gabapentin (NEURONTIN) 300 MG capsule Take 1 capsule (300 mg total) by mouth 3 (three) times  daily. 90 capsule 2  . Multiple Vitamin (MULTIVITAMIN) tablet Take 1 tablet by mouth daily. Senior formula    . Omega-3 Fatty Acids (FISH OIL PO) Take 1 capsule by mouth daily.    Marland Kitchen OVER THE COUNTER MEDICATION Take 1 tablet by mouth daily. Super C Complex    . oxyCODONE-acetaminophen (PERCOCET) 7.5-325 MG tablet Take 1-2 tablets by mouth every 4 (four) hours as needed for severe pain. 60 tablet 0  . vitamin C (ASCORBIC ACID) 500 MG tablet Take 500 mg by mouth daily.      _0 @ _1 @  Results for orders placed or performed during the hospital encounter of 05/31/17 (from the past 48 hour(s))  Prepare RBC     Status: None   Collection Time: 05/31/17  2:38 PM  Result Value  Ref Range   Order Confirmation ORDER PROCESSED BY BLOOD BANK   Type and screen [place]     Status: None (Preliminary result)   Collection Time: 05/31/17  2:45 PM  Result Value Ref Range   ABO/RH(D) B POS    Antibody Screen NEG    Sample Expiration 06/03/2017    Unit Number [id number]    Blood Component Type RED CELLS,LR    Unit division 00    Status of Unit ISSUED    Transfusion Status OK TO TRANSFUSE    Crossmatch Result Compatible    Unit Number [id number]    Blood Component Type RED CELLS,LR    Unit division 00    Status of Unit ISSUED    Transfusion Status OK TO TRANSFUSE    Crossmatch Result Compatible   I-STAT, chem 8     Status: Abnormal   Collection Time: 05/31/17  2:58 PM  Result Value Ref Range   Sodium 138 135 - 145 mmol/L   Potassium 4.3 3.5 - 5.1 mmol/L   Chloride 103 101 - 111 mmol/L   BUN 15 6 - 20 mg/dL   Creatinine, Ser 0.90 0.61 - 1.24 mg/dL   Glucose, Bld 111 (H) 65 - 99 mg/dL   Calcium, Ion 1.02 (L) 1.15 - 1.40 mmol/L   TCO2 25 22 - 32 mmol/L   Hemoglobin 7.5 (L) 13.0 - 17.0 g/dL   HCT 22.0 (L) 39.0 - 52.0 %  CBC     Status: Abnormal   Collection Time: 05/31/17  3:08 PM  Result Value Ref Range   WBC 8.0 4.0 - 10.5 K/uL   RBC 2.71 (L) 4.22 - 5.81 MIL/uL   Hemoglobin 8.1 (L) 13.0 - 17.0 g/dL   HCT 24.4 (L) 39.0 - 52.0 %   MCV 90.0 78.0 - 100.0 fL   MCH 29.9 26.0 - 34.0 pg   MCHC 33.2 30.0 - 36.0 g/dL   RDW 14.6 11.5 - 15.5 %   Platelets 164 150 - 400 K/uL  Basic metabolic panel     Status: Abnormal   Collection Time: 05/31/17  3:08 PM  Result Value Ref Range   Sodium 139 135 - 145 mmol/L   Potassium 4.4 3.5 - 5.1 mmol/L   Chloride 105 101 - 111 mmol/L   CO2 27 22 - 32 mmol/L   Glucose, Bld 110 (H) 65 - 99 mg/dL   BUN 14 6 - 20 mg/dL   Creatinine, Ser 0.96 0.61 - 1.24 mg/dL   Calcium 8.1 (L) 8.9 - 10.3 mg/dL   GFR calc non Af Amer >60 >60 mL/min   GFR calc Af Amer >60 >60 mL/min    Comment: (NOTE)  The eGFR has been  calculated using the CKD EPI equation. This calculation has not been validated in all clinical situations. eGFR's persistently <60 mL/min signify possible Chronic Kidney Disease.    Anion gap 7 5 - 15   No results found.  ECG/TELE: ECG rate 140s, narrow-- mostly regular with mild irregularity and diffuse mild ST depressions.  No obvious flutter or sinus P waves.  I reviewed one tele strip in the chart more convincing for a fibrillation.   ROS: As above. Otherwise, review of systems is negative unless per above HPI  Vitals:   05/31/17 1339 05/31/17 1715 05/31/17 1730 05/31/17 1745  BP: (!) 142/84 (!) 142/130 (!) 177/108   Pulse: 81 (!) 140  (!) 139  Resp: 20 20  (!) 24  Temp: 99 F (37.2 C) (!) 97.5 F (36.4 C)    TempSrc: Oral     SpO2: 98% 100%  100%   Wt Readings from Last 10 Encounters:  05/27/17 82.2 kg (181 lb 3.2 oz)  05/22/17 82.2 kg (181 lb 3.2 oz)  03/20/17 77.6 kg (171 lb)  03/06/17 77.7 kg (171 lb 3.2 oz)  04/16/16 75.8 kg (167 lb)  04/02/16 75.8 kg (167 lb)    PE:  General: No acute distress, disoriented HEENT: Atraumatic, EOMI, mucous membranes moist. JVD at 45 degrees to just above the clavicle. No HJR. CV: RRR, bounding pulses/heart sounds no murmurs, gallops.  Respiratory: Clear, no crackles. Normal work of breathing ABD: Non-distended and non-tender. No palpable organomegaly.  Extremities: warm 2+ radial pulses bilaterally. no edema. Neuro/Psych: CN grossly intact, alert and oriented  Assessment/Plan Post op anemia No known CAD Narrow complex tachycardia/ ? Post op AF/flutter  Narrow complex tachycardia- some strips at bedside most convincing for afib with report of flutter in the OR; although unclear if he is in this now in the PACU as it looks regular and is close to 150 BPM so could be flutter or sinus with some arrhythmia.  He is hemodynamically stable and asymptomatic.  For his anemia, he is getting 2 units of RBCs.  At this point, would just  monitor rhythm on telemetry and with adequate pain control and improvement in anemia he may slow down revealing the underlying rhythm.  If patient is unstable or becomes more tachycardic, we can trial IV or PO metoprolol and re-assess.  At this point, no anticoagulation indication and contra-indicated from a surgical standpoint-- we will continue to follow.    Lolita Cram Means  MD 05/31/2017, 6:26 PM

## 2017-05-31 NOTE — OR Nursing (Signed)
Bladder scan done, approximately 600 cc's in bladder.  Pt in and out cathed by Melvenia Beam, RN.

## 2017-05-31 NOTE — Op Note (Signed)
05/31/2017  5:07 PM  PATIENT:  Glen [name]  81 y.o. male  PRE-OPERATIVE DIAGNOSIS:  Large lumbar wound dehiscence measuring 10" x 3"  POST-OPERATIVE DIAGNOSIS:  same  PROCEDURE:  Irrigation and debridement of lumbar wound with primary reclosure  SURGEON:  Sherley Bounds, MD  ASSISTANTSShelba Flake FNP  ANESTHESIA:   General  EBL: less than 20 ml  Total I/O In: 630 [Blood:630] Out: 50 [Blood:50]  BLOOD ADMINISTERED: none  DRAINS: medium Hemovac  SPECIMEN:  none  INDICATION FOR PROCEDURE: This patient presented with a large lumbar wound dehiscence a few days after the large lumbar fusion procedure.  Recommended lumbar wound irrigation and debridement withreclosure. Patient understood the risks, benefits, and alternatives and potential outcomes and wished to proceed.  PROCEDURE DETAILS: The patient was taken to the operating room and after induction of adequate generalized endotracheal anesthesia, the patient was rolled into the prone position on the Wilson frame and all pressure points were padded. The lumbar region was cleaned and then prepped with Betadine scrub and paint and then draped in usual sterile fashion. The old incision was then exposed and the running Prolene and Monocryl was removed. The fascia was opened and the wound was inspected. There was a large hematoma which was removed with suction. We then irrigated with 2 L of bacitracin containing saline solution. Once this was completed we inspected the hardware and it appeared to have good purchase. We placed a medium Hemovac drain through a separate stab incision and then proceeded with closure.  Powdered vancomycin was placed into the wound. We then closed the muscle and the fascia with 0 Vicryl. I closed the subcutaneous tissues with 2-0 Vicryl and the subcuticular tissues with 3-0 Vicryl. The skin was then closed with benzoin and Steri-Strips. The drapes were removed, a sterile dressing was applied. The patient was awakened  from general anesthesia and transferred to the recovery room in stable condition. At the end of the procedure all sponge, needle and instrument counts were correct.    PLAN OF CARE: Admit to inpatient   PATIENT DISPOSITION:  PACU - hemodynamically stable.   Delay start of Pharmacological VTE agent (>24hrs) due to surgical blood loss or risk of bleeding:  yes

## 2017-06-01 ENCOUNTER — Encounter (HOSPITAL_COMMUNITY): Payer: Self-pay | Admitting: Neurological Surgery

## 2017-06-01 DIAGNOSIS — I480 Paroxysmal atrial fibrillation: Secondary | ICD-10-CM | POA: Diagnosis not present

## 2017-06-01 DIAGNOSIS — R000 Tachycardia, unspecified: Secondary | ICD-10-CM | POA: Diagnosis not present

## 2017-06-01 DIAGNOSIS — I361 Nonrheumatic tricuspid (valve) insufficiency: Secondary | ICD-10-CM | POA: Diagnosis not present

## 2017-06-01 DIAGNOSIS — I4891 Unspecified atrial fibrillation: Secondary | ICD-10-CM | POA: Diagnosis present

## 2017-06-01 DIAGNOSIS — G8918 Other acute postprocedural pain: Secondary | ICD-10-CM | POA: Diagnosis not present

## 2017-06-01 DIAGNOSIS — I4892 Unspecified atrial flutter: Secondary | ICD-10-CM | POA: Diagnosis present

## 2017-06-01 DIAGNOSIS — Y838 Other surgical procedures as the cause of abnormal reaction of the patient, or of later complication, without mention of misadventure at the time of the procedure: Secondary | ICD-10-CM | POA: Diagnosis present

## 2017-06-01 DIAGNOSIS — Z87891 Personal history of nicotine dependence: Secondary | ICD-10-CM | POA: Diagnosis not present

## 2017-06-01 DIAGNOSIS — T8130XA Disruption of wound, unspecified, initial encounter: Secondary | ICD-10-CM | POA: Diagnosis not present

## 2017-06-01 DIAGNOSIS — E871 Hypo-osmolality and hyponatremia: Secondary | ICD-10-CM | POA: Diagnosis present

## 2017-06-01 DIAGNOSIS — T8131XA Disruption of external operation (surgical) wound, not elsewhere classified, initial encounter: Secondary | ICD-10-CM | POA: Diagnosis present

## 2017-06-01 DIAGNOSIS — Z981 Arthrodesis status: Secondary | ICD-10-CM | POA: Diagnosis not present

## 2017-06-01 DIAGNOSIS — E785 Hyperlipidemia, unspecified: Secondary | ICD-10-CM | POA: Diagnosis present

## 2017-06-01 DIAGNOSIS — K219 Gastro-esophageal reflux disease without esophagitis: Secondary | ICD-10-CM | POA: Diagnosis present

## 2017-06-01 DIAGNOSIS — D62 Acute posthemorrhagic anemia: Secondary | ICD-10-CM | POA: Diagnosis present

## 2017-06-01 LAB — TYPE AND SCREEN
ABO/RH(D): B POS
Antibody Screen: NEGATIVE
UNIT DIVISION: 0
Unit division: 0

## 2017-06-01 LAB — CBC
HCT: 26.9 % — ABNORMAL LOW (ref 39.0–52.0)
Hemoglobin: 9.1 g/dL — ABNORMAL LOW (ref 13.0–17.0)
MCH: 29.4 pg (ref 26.0–34.0)
MCHC: 33.8 g/dL (ref 30.0–36.0)
MCV: 86.8 fL (ref 78.0–100.0)
PLATELETS: 191 10*3/uL (ref 150–400)
RBC: 3.1 MIL/uL — ABNORMAL LOW (ref 4.22–5.81)
RDW: 14.4 % (ref 11.5–15.5)
WBC: 8.4 10*3/uL (ref 4.0–10.5)

## 2017-06-01 LAB — BASIC METABOLIC PANEL
ANION GAP: 8 (ref 5–15)
BUN: 13 mg/dL (ref 6–20)
CALCIUM: 7.9 mg/dL — AB (ref 8.9–10.3)
CO2: 26 mmol/L (ref 22–32)
Chloride: 96 mmol/L — ABNORMAL LOW (ref 101–111)
Creatinine, Ser: 0.85 mg/dL (ref 0.61–1.24)
Glucose, Bld: 135 mg/dL — ABNORMAL HIGH (ref 65–99)
Potassium: 3.8 mmol/L (ref 3.5–5.1)
SODIUM: 130 mmol/L — AB (ref 135–145)

## 2017-06-01 LAB — BPAM RBC
BLOOD PRODUCT EXPIRATION DATE: 201810302359
Blood Product Expiration Date: 201810302359
ISSUE DATE / TIME: 201810131549
ISSUE DATE / TIME: 201810131549
Unit Type and Rh: 7300
Unit Type and Rh: 7300

## 2017-06-01 LAB — TROPONIN I
Troponin I: <0.03 ng/mL (ref <0.03)
Troponin I: <0.03 ng/mL (ref <0.03)

## 2017-06-01 MED ORDER — LABETALOL HCL 5 MG/ML IV SOLN
5.0000 mg | INTRAVENOUS | Status: DC | PRN
  Administered 2017-06-01: 5 mg via INTRAVENOUS
  Filled 2017-06-01 (×2): qty 4

## 2017-06-01 NOTE — Progress Notes (Signed)
Patient experiencing increased confusion, drowsiness, diaphoretic. Minimal serous drainage at hemovac insertion site, old drainage marked, no swelling or warmness at surgical incision site. 600 mL output from hemovac within 2-4 hours. MD notified. CBC, BMP stat ordered per Dr. Vertell Limber. Use least amount of sedating medications and narcotics as possible while still managing patient pain per Dr Vertell Limber. RN will continue to monitor patient.

## 2017-06-01 NOTE — Progress Notes (Signed)
OT Cancellation Note  Patient Details Name: Glen [name] MRN: [id number] DOB: Apr 06, 1936   Cancelled Treatment:    Reason Eval/Treat Not Completed: Other (comment) (per RN; pt nauseated and just returned to bed). Recommending hold on OT eval at this time. Will follow up as time allows.  Binnie Kand M.S., OTR/L Pager: [phone]  06/01/2017, 3:25 PM

## 2017-06-01 NOTE — Evaluation (Addendum)
Physical Therapy Evaluation Patient Details Name: Glen [name] MRN: [id number] DOB: 12-Jul-1936 Today's Date: 06/01/2017   History of Present Illness  Patient is a 81 y.o. male who comes from a rehab facility in [place]. He is 4 days postop from an L2-S1 lami for decompression  And L5-S1 interbody fusion with posterior segmental fixation by Dr. Cyndy Freeze. Transferred here from [facility] where the physician there stated that his wound dehisced this morning when he was up walking.  Clinical Impression  Pt tolerated mobility very well despite anxiety of wound dehiscence by patient. Pt able to recall back precautions and tolerated ambulation well at appropriate speed. Acute PT to con't to follow.    Follow Up Recommendations SNF    Equipment Recommendations  3in1 (PT);Rolling walker with 5" wheels;Other (comment) (shower seat)    Recommendations for Other Services       Precautions / Restrictions Precautions Precautions: Fall;Back Precaution Booklet Issued: No Precaution Comments: pt able to recall from previous admission Spinal Brace:  (orders state no brace needed) Restrictions Weight Bearing Restrictions: No      Mobility  Bed Mobility Overal bed mobility: Needs Assistance Bed Mobility: Sit to Sidelying;Rolling Rolling: Min guard       Sit to sidelying: Min assist General bed mobility comments: v/c's for sequeincing, minA for trunk elevation  Transfers Overall transfer level: Needs assistance Equipment used: Rolling walker (2 wheeled) Transfers: Sit to/from Stand Sit to Stand: Min assist         General transfer comment: v/c's for safe hand placement  Ambulation/Gait Ambulation/Gait assistance: Min assist Ambulation Distance (Feet): 150 Feet Assistive device: Rolling walker (2 wheeled) Gait Pattern/deviations: Step-through pattern;Decreased weight shift to left (flexed knee left with fatigue) Gait velocity: below   General Gait Details: pt with increased  bilat LE WBing/hard grip, v/c's to decrease grip. mild shakiness but able to complete reciprocal gait pattern without LOB  Stairs            Wheelchair Mobility    Modified Rankin (Stroke Patients Only)       Balance Overall balance assessment: Needs assistance Sitting-balance support: Feet supported;No upper extremity supported Sitting balance-Leahy Scale: Fair     Standing balance support: Bilateral upper extremity supported Standing balance-Leahy Scale: Poor Standing balance comment: needs external support today from RW and therapist.                              Pertinent Vitals/Pain Pain Assessment: 0-10 Pain Score: 5  Pain Location: back Pain Descriptors / Indicators: Discomfort;Grimacing Pain Intervention(s): Monitored during session    Home Living Family/patient expects to be discharged to:: Skilled nursing facility                      Prior Function Level of Independence: Independent               Hand Dominance   Dominant Hand: Right    Extremity/Trunk Assessment   Upper Extremity Assessment Upper Extremity Assessment: Overall WFL for tasks assessed    Lower Extremity Assessment Lower Extremity Assessment: Generalized weakness LLE Sensation:  (intact to LT, R LE intact to LT as well)    Cervical / Trunk Assessment Cervical / Trunk Assessment: Other exceptions Cervical / Trunk Exceptions: s/p spinal surgery  Communication   Communication: HOH  Cognition Arousal/Alertness: Awake/alert Behavior During Therapy: Anxious (regarding incision dehising again) Overall Cognitive Status: Within Functional Limits for tasks assessed  General Comments General comments (skin integrity, edema, etc.): dressing in tact at incision    Exercises     Assessment/Plan    PT Assessment Patient needs continued PT services  PT Problem List Decreased strength;Decreased activity  tolerance;Decreased balance;Decreased mobility;Decreased knowledge of use of DME;Decreased knowledge of precautions;Pain       PT Treatment Interventions DME instruction;Gait training;Stair training;Functional mobility training;Therapeutic activities;Therapeutic exercise;Balance training;Neuromuscular re-education;Patient/family education    PT Goals (Current goals can be found in the Care Plan section)  Acute Rehab PT Goals Patient Stated Goal: to get better consistantly PT Goal Formulation: With patient/family Time For Goal Achievement: 06/15/17 Potential to Achieve Goals: Good    Frequency Min 5X/week   Barriers to discharge        Co-evaluation               AM-PAC PT "6 Clicks" Daily Activity  Outcome Measure Difficulty turning over in bed (including adjusting bedclothes, sheets and blankets)?: Unable Difficulty moving from lying on back to sitting on the side of the bed? : Unable Difficulty sitting down on and standing up from a chair with arms (e.g., wheelchair, bedside commode, etc,.)?: A Little Help needed moving to and from a bed to chair (including a wheelchair)?: A Little Help needed walking in hospital room?: A Little Help needed climbing 3-5 steps with a railing? : A Lot 6 Click Score: 13    End of Session Equipment Utilized During Treatment: Gait belt Activity Tolerance: Patient tolerated treatment well Patient left: with call bell/phone within reach;with family/visitor present;in chair;with bed alarm set Nurse Communication: Mobility status PT Visit Diagnosis: Other symptoms and signs involving the nervous system (R29.898);Pain Pain - Right/Left:  (lower) Pain - part of body:  (back)    Time: 3300-7622 PT Time Calculation (min) (ACUTE ONLY): 30 min   Charges:   PT Evaluation $PT Eval Moderate Complexity: 1 Mod PT Treatments $Gait Training: 8-22 mins   PT G Codes:   PT G-Codes **NOT FOR INPATIENT CLASS** Functional Assessment Tool Used: Clinical  judgement Functional Limitation: Mobility: Walking and moving around Mobility: Walking and Moving Around Current Status (Q3335): At least 40 percent but less than 60 percent impaired, limited or restricted Mobility: Walking and Moving Around Goal Status [phone]): At least 1 percent but less than 20 percent impaired, limited or restricted    Kittie Plater, PT, DPT Pager #: [phone] Office #: [phone]   [place] 06/01/2017, 12:33 PM

## 2017-06-01 NOTE — Progress Notes (Signed)
Patient ID: Glen [name], male   DOB: Jul 21, 1936, 81 y.o.   MRN: [id number] Doing well, denies pain, mobilize today, control BP, PT/OT

## 2017-06-01 NOTE — Progress Notes (Signed)
12 lead EKG obtained, copy placed in patient chart. RN will continue to monitor.

## 2017-06-01 NOTE — Progress Notes (Signed)
Patient ambulated in hall with PT and RN, sat in recliner for 35-45 min, back to bed. Patient experiencing nausea, IV zofran administered. RN will continue to monitor patient.

## 2017-06-02 ENCOUNTER — Encounter (HOSPITAL_COMMUNITY): Payer: Self-pay | Admitting: *Deleted

## 2017-06-02 ENCOUNTER — Inpatient Hospital Stay (HOSPITAL_COMMUNITY): Payer: Medicare Other

## 2017-06-02 DIAGNOSIS — G8918 Other acute postprocedural pain: Secondary | ICD-10-CM

## 2017-06-02 DIAGNOSIS — D62 Acute posthemorrhagic anemia: Secondary | ICD-10-CM

## 2017-06-02 DIAGNOSIS — T8130XA Disruption of wound, unspecified, initial encounter: Secondary | ICD-10-CM

## 2017-06-02 DIAGNOSIS — I361 Nonrheumatic tricuspid (valve) insufficiency: Secondary | ICD-10-CM

## 2017-06-02 DIAGNOSIS — R000 Tachycardia, unspecified: Secondary | ICD-10-CM

## 2017-06-02 DIAGNOSIS — Z981 Arthrodesis status: Secondary | ICD-10-CM

## 2017-06-02 DIAGNOSIS — I4891 Unspecified atrial fibrillation: Secondary | ICD-10-CM

## 2017-06-02 DIAGNOSIS — E871 Hypo-osmolality and hyponatremia: Secondary | ICD-10-CM

## 2017-06-02 LAB — ECHOCARDIOGRAM COMPLETE
Ao-asc: 37 cm
E decel time: 123 msec
FS: 16 % — AB (ref 28–44)
Height: 72 in
IVS/LV PW RATIO, ED: 1
LA ID, A-P, ES: 43 mm
LA diam end sys: 43 mm
LA diam index: 2.1 cm/m2
LA vol A4C: 54.2 ml
LA vol index: 33.7 mL/m2
LA vol: 69.1 mL
LV PW d: 10 mm — AB (ref 0.6–1.1)
LV e' LATERAL: 14.7 cm/s
LVOT area: 2.84 cm2
LVOT diameter: 19 mm
Lateral S' vel: 12.9 cm/s
MV Dec: 123
MV pk E vel: 1.8 m/s
TAPSE: 21.4 mm
TDI e' lateral: 14.7
TDI e' medial: 11
Weight: 2896 oz

## 2017-06-02 MED ORDER — MAGNESIUM CITRATE PO SOLN
1.0000 | Freq: Once | ORAL | Status: AC
  Administered 2017-06-02: 1 via ORAL
  Filled 2017-06-02: qty 296

## 2017-06-02 MED ORDER — METOPROLOL TARTRATE 12.5 MG HALF TABLET
12.5000 mg | ORAL_TABLET | Freq: Two times a day (BID) | ORAL | Status: DC
  Administered 2017-06-02 – 2017-06-04 (×5): 12.5 mg via ORAL
  Filled 2017-06-02 (×5): qty 1

## 2017-06-02 MED ORDER — KCL IN DEXTROSE-NACL 20-5-0.45 MEQ/L-%-% IV SOLN
INTRAVENOUS | Status: DC
  Administered 2017-06-02 – 2017-06-04 (×4): via INTRAVENOUS
  Filled 2017-06-02 (×5): qty 1000

## 2017-06-02 MED FILL — Thrombin For Soln Kit 20000 Unit: CUTANEOUS | Qty: 1 | Status: AC

## 2017-06-02 NOTE — Progress Notes (Signed)
I briefly met with pt at bedside with family. Pt not in need of an intense inpt rehab admission at this time. Functionally doing very well and Home health is recommended at this time. We will sign off. [phone]

## 2017-06-02 NOTE — Progress Notes (Signed)
Progress Note  Patient Name: Glen [name] Date of Encounter: 06/02/2017  Primary Cardiologist: Dr Orpah Greek in [place], [place] Dr Debara Pickett while here  Patient Profile     81 y.o. male w/ hx GERD, HOH, SEM, HLD, DDD, Cataracts, was admitted 10/13 after lumbar back surgery. Cards seeing for rapid afib.   Subjective   Pt not aware of arrhythmia. No CP/SOB, dizziness, weakness except from surgery - pt getting IVF because they thought he had lost too much fluid.   Inpatient Medications    Scheduled Meds: . celecoxib  200 mg Oral Q12H  . cholecalciferol  1,000 Units Oral Daily  . multivitamin with minerals  1 tablet Oral Daily  . senna  1 tablet Oral BID  . sodium chloride flush  3 mL Intravenous Q12H  . cyanocobalamin  1,000 mcg Oral Daily  . vitamin C  500 mg Oral Daily   Continuous Infusions: . sodium chloride Stopped (06/01/17 1000)  . 0.9 % NaCl with KCl 20 mEq / L    . dextrose 5 % and 0.45 % NaCl with KCl 20 mEq/L 100 mL/hr at 06/02/17 0748  . methocarbamol (ROBAXIN)  IV Stopped (05/31/17 1929)   PRN Meds: acetaminophen **OR** acetaminophen, labetalol, menthol-cetylpyridinium **OR** phenol, methocarbamol **OR** methocarbamol (ROBAXIN)  IV, ondansetron **OR** ondansetron (ZOFRAN) IV, oxyCODONE-acetaminophen **AND** oxyCODONE, sodium chloride flush   Vital Signs    Vitals:   06/02/17 0400 06/02/17 0700 06/02/17 0800 06/02/17 0805  BP: (!) 147/86     Pulse: (!) 48     Resp: 20     Temp:  98.4 F (36.9 C)  (!) 97.3 F (36.3 C)  TempSrc:   Oral   SpO2: 98%     Weight:    181 lb (82.1 kg)  Height:    6' (1.829 m)    Intake/Output Summary (Last 24 hours) at 06/02/17 0903 Last data filed at 06/02/17 0300  Gross per 24 hour  Intake              746 ml  Output             2995 ml  Net            -2249 ml   Filed Weights   06/02/17 0805  Weight: 181 lb (82.1 kg)    Telemetry    Atrial fib vs flutter, HR a little less than 100 overnight. Now consistent >100 -  Personally Reviewed  ECG    10/14 at 7:49 am>>SR, HR 80, no acute changes - Personally Reviewed  Physical Exam   General: Well developed, well nourished, male appearing in no acute distress. Head: Normocephalic, atraumatic.  Neck: Supple without bruits, JVD not elevated. Lungs:  Resp regular and unlabored, few rales bases. Heart: Irreg R&R, S1, S2, no S3, S4, soft murmur; no rub. Abdomen: Soft, non-tender, non-distended with normoactive bowel sounds. No hepatomegaly. No rebound/guarding. No obvious abdominal masses. Incision in his back is dressed, drain in place Extremities: No clubbing, cyanosis, no edema. Distal pedal pulses are 2+ bilaterally. Neuro: Alert and oriented X 3. Moves all extremities spontaneously. Psych: Normal affect.  Labs    Hematology Recent Labs Lab 05/31/17 1508 05/31/17 1835 06/01/17 1745  WBC 8.0 7.7 8.4  RBC 2.71* 3.21* 3.10*  HGB 8.1* 9.3* 9.1*  HCT 24.4* 28.5* 26.9*  MCV 90.0 88.8 86.8  MCH 29.9 29.0 29.4  MCHC 33.2 32.6 33.8  RDW 14.6 14.4 14.4  PLT 164 155 191    Chemistry  Recent Labs Lab 05/28/17 0229 05/31/17 1458 05/31/17 1508 06/01/17 1745  NA 136 138 139 130*  K 5.1 4.3 4.4 3.8  CL 104 103 105 96*  CO2 19*  --  27 26  GLUCOSE 257* 111* 110* 135*  BUN 19 15 14 13   CREATININE 1.59* 0.90 0.96 0.85  CALCIUM 7.7*  --  8.1* 7.9*  GFRNONAA 40*  --  >60 >60  GFRAA 46*  --  >60 >60  ANIONGAP 13  --  7 8     Cardiac Enzymes Recent Labs Lab 05/31/17 1835 06/01/17 0035 06/01/17 0736  TROPONINI <0.03 <0.03 <0.03    Radiology    No results found.   Cardiac Studies   ECHO: 06/02/2017  Ordered  ECHO: 10/2016 BY DR Sabra Heck, results are being faxed from his office  Patient Profile     81 y.o. male w/ hx  GERD, HOH, SEM, HLD, DDD, Cataracts, was admitted 10/13 after lumbar back surgery. Cards seeing for rapid afib.   Assessment & Plan    1. Rapid atrial fib - not on any rate-lowering meds PTA - will add low-dose BB  and follow (taking po's well) - current episode started after surgery, but he may have had this before since no sx - CHADS2VASC= 2 (age x 2)  - He would be anticoagulation candidate, discussed risks/benefits w/ pt, daughter and daughter-in-law, they prefer DOAC, not coumadin. Reassured them we would not start anything till cleared by surgeons  Otherwise, per Dr Ditty Active Problems:   Wound dehiscence    Signed, Rosaria Ferries , PA-C 9:03 AM 06/02/2017 Pager: [phone]

## 2017-06-02 NOTE — Progress Notes (Signed)
Pt seen and examined. No issues overnight.  EXAM: Temp:  [97.2 F (36.2 C)-98.4 F (36.9 C)] 97.3 F (36.3 C) (10/15 0805) Pulse Rate:  [48-97] 48 (10/15 0400) Resp:  [15-24] 20 (10/15 0400) BP: (135-179)/(64-90) 147/86 (10/15 0400) SpO2:  [94 %-98 %] 98 % (10/15 0400) Weight:  [82.1 kg (181 lb)] 82.1 kg (181 lb) (10/15 0805) Intake/Output      10/14 0701 - 10/15 0700 10/15 0701 - 10/16 0700   P.O. 1080    I.V. (mL/kg) 26    Total Intake(mL/kg) 1106    Urine (mL/kg/hr) 2775    Drains 920    Stool 0    Total Output 3695     Net -2589          Urine Occurrence 1 x     Awake and alert Follows commands throughout Full strength Bandage c/d/i  Rate improved Cardiology following for afib, started low dose BB Continue current care Will place rehab consult, family would like to go to rehab here

## 2017-06-02 NOTE — Evaluation (Signed)
Occupational Therapy Evaluation and Discharge Patient Details Name: Glen [name] MRN: [id number] DOB: 1936-06-03 Today's Date: 06/02/2017    History of Present Illness Patient is a 81 y.o. male who comes from a rehab facility in [place]. He is 4 days postop from an L2-S1 lami for decompression  And L5-S1 interbody fusion with posterior segmental fixation by Dr. Cyndy Freeze. Transferred here from [facility] where the physician there stated that his wound dehisced this morning when he was up walking. 05/31/17 Irrigation and debridement of lumbar wound with primary reclosure and drain   Clinical Impression   This 81 yo male admitted with above presents to acute OT with all acute OT education completed. Pt will need follow up [place] to get to an independent to Mod I level in his own home.    Follow Up Recommendations  Home health OT;Supervision/Assistance - 24 hour    Equipment Recommendations  3 in 1 bedside commode;Hospital bed;Other (comment) (hospital bed with electric feet and head)       Precautions / Restrictions Precautions Precautions: Fall;Back Precaution Booklet Issued: Yes (comment) Precaution Comments: Gave pt and family another copy of post op back care therapy sheet Required Braces or Orthoses:  (current order states brace not needed) Spinal Brace: Other (comment) (current order states no brace)      Mobility Bed Mobility Overal bed mobility: Needs Assistance Bed Mobility: Sit to Sidelying;Rolling Rolling: Min guard Sidelying to sit: Min guard     Sit to sidelying: Min guard General bed mobility comments: cues for maintaining all precautions without physical assist, use of rail and HOB flat  Transfers Overall transfer level: Needs assistance Equipment used: Rolling walker (2 wheeled) Transfers: Sit to/from Stand Sit to Stand: Min guard         General transfer comment: cues for hand placement and posture, pt with tendency to look down and initiate bending     Balance Overall balance assessment: Needs assistance   Sitting balance-Leahy Scale: Good       Standing balance-Leahy Scale: Poor                             ADL either performed or assessed with clinical judgement   ADL                                         General ADL Comments: Discussed with pt and family the use of wet wipes post a bowel movement to help avoid twisting; using 2 cups to brush teeth (spit and rinse) to avoid bending over the sink; most efficient sequence of dressing depending on starting supine or in sitting; sitting time limit     Vision Patient Visual Report: No change from baseline              Pertinent Vitals/Pain Pain Assessment: 0-10 Pain Score: 7  Pain Location: up to a 7 when he sits; but relief with standing and sitting Pain Descriptors / Indicators: Grimacing Pain Intervention(s): Monitored during session;Repositioned     Hand Dominance Right   Extremity/Trunk Assessment Upper Extremity Assessment Upper Extremity Assessment: Overall WFL for tasks assessed           Communication Communication Communication: No difficulties   Cognition Arousal/Alertness: Awake/alert Behavior During Therapy: WFL for tasks assessed/performed Overall Cognitive Status: Within Functional Limits for tasks assessed  Home Living Family/patient expects to be discharged to:: Private residence Living Arrangements: Spouse/significant other;Children Available Help at Discharge: Family;Available 24 hours/day Type of Home: House Home Access: Stairs to enter [employer] of Steps: 2 Entrance Stairs-Rails: None Home Layout: Multi-level;Full bath on main level;Able to live on main level with bedroom/bathroom     Bathroom Shower/Tub: Occupational psychologist: Handicapped height     Home Equipment: Shower seat - built in;Grab bars - toilet;Grab  bars - tub/shower;Other (comment) (built in seat is too small)          Prior Functioning/Environment Level of Independence: Independent                 OT Problem List: Decreased range of motion;Impaired balance (sitting and/or standing);Pain         OT Goals(Current goals can be found in the care plan section) Acute Rehab OT Goals Patient Stated Goal: to be able to sit without pain  OT Frequency:                AM-PAC PT "6 Clicks" Daily Activity     Outcome Measure Help from another person eating meals?: None Help from another person taking care of personal grooming?: A Little Help from another person toileting, which includes using toliet, bedpan, or urinal?: A Little Help from another person bathing (including washing, rinsing, drying)?: A Little Help from another person to put on and taking off regular upper body clothing?: A Little Help from another person to put on and taking off regular lower body clothing?: A Lot 6 Click Score: 18   End of Session Equipment Utilized During Treatment: Rolling walker Nurse Communication:  (pt out of bathroom and back in bed)  Activity Tolerance: Patient tolerated treatment well Patient left: in bed;with call bell/phone within reach;with family/visitor present;with bed alarm set  OT Visit Diagnosis: Unsteadiness on feet (R26.81);Pain Pain - part of body:  (back)                Time: 1610-9604 OT Time Calculation (min): 31 min Charges:  OT General Charges $OT Visit: 1 Visit OT Evaluation $OT Eval Moderate Complexity: 1 Mod OT Treatments $Self Care/Home Management : 8-22 mins Golden Circle, OTR/L [phone] 06/02/2017

## 2017-06-02 NOTE — Progress Notes (Signed)
Patient converted into  A. Fib with heart rate jumping up into 140's during sleep and has excreted 1725ml of urine since 1900 06/01/2017. Physician notified and verbal order received to start D5 1/2NS +20K @100  mL per hour.

## 2017-06-02 NOTE — Consult Note (Signed)
Physical Medicine and Rehabilitation Consult Reason for Consult: Decreased functional mobility Referring Physician: Dr. Cyndy Freeze   HPI: Glen [name] is a 81 y.o.right handed male with history of recent L2-S1 laminectomy for decompression with L3-4, 4-5 and L5-S1 Gill procedures, posterior lumbar interbody fusion 05/27/2017 per Dr. Cyndy Freeze. Per chart review and family, patient lives with wife in [place] independent prior to admission. Local family with good support. One level home 2 steps to entry. Patient was discharged 05/30/2017 to a skilled nursing facility and [place]. Required moderate assist ambulating 130 feet with a rolling walker at time of discharge. Patient was readmitted 05/31/2017 for wound dehiscence and hemoglobin of 6.1. Underwent irrigation and debridement of lumbar wound with primary reclosure 05/31/2017 per Dr. Sherley Bounds. Postoperative cardiology consulted for tachycardia in the 140s.patient also required transfusion. EKG showed no obvious flutter or sinus P waves.conservative care of tachycardia. Echocardiogram is pending. Latest follow-up CBC after transfusion 9.1. Physical therapy evaluation completed 06/01/2017. Question back brace needed.. M.D. Has requested physical medicine rehabilitation consult.  Review of Systems  Constitutional: Negative for chills and fever.  HENT: Negative for hearing loss.   Eyes: Negative for blurred vision and double vision.  Respiratory: Negative for cough and shortness of breath.   Cardiovascular: Negative for chest pain and leg swelling.  Gastrointestinal: Positive for constipation and nausea.       GERD  Genitourinary: Negative for dysuria, flank pain and hematuria.  Musculoskeletal: Positive for back pain and myalgias.  Skin: Negative for rash.  Neurological: Negative for seizures and weakness.  All other systems reviewed and are negative.  Past Medical History:  Diagnosis Date  . Allergy   . Arthritis   .  Cataracts, bilateral    bil cataracts removed  . DDD (degenerative disc disease), lumbosacral    pain radiates down left leg  . Environmental and seasonal allergies   . GERD (gastroesophageal reflux disease)   . Hearing loss    bilateral hearing aids  . Heart murmur    recently  saw cardiologist and said it was fine   . Hx of adenomatous colonic polyps 04/23/2016  . Hyperlipidemia    Past Surgical History:  Procedure Laterality Date  . APPLICATION OF ROBOTIC ASSISTANCE FOR SPINAL PROCEDURE N/A 05/27/2017   Procedure: APPLICATION OF ROBOTIC ASSISTANCE FOR SPINAL PROCEDURE;  Surgeon: Ditty, Kevan Ny, MD;  Location: [facility];  Service: Neurosurgery;  Laterality: N/A;  . cataract     bilat  . CIRCUMCISION    . COLONOSCOPY    . COLONOSCOPY W/ POLYPECTOMY    . LAMINECTOMY  05/27/2017   SACRAL  . LUMBAR WOUND DEBRIDEMENT N/A 05/31/2017   Procedure: LUMBAR WOUND REVISION;  Surgeon: Eustace Moore, MD;  Location: [facility];  Service: Neurosurgery;  Laterality: N/A;  . POLYPECTOMY    . TONSILLECTOMY    . wisdom teth extraction     Family History  Problem Relation Age of Onset  . Colon cancer Neg Hx   . Esophageal cancer Neg Hx   . Rectal cancer Neg Hx   . Stomach cancer Neg Hx   . Pancreatic cancer Neg Hx   . Prostate cancer Neg Hx    Social History:  reports that he quit smoking about 10 months ago. His smoking use included Cigarettes. He smoked 1.00 pack per day. He has never used smokeless tobacco. He reports that he drinks about 4.2 oz of alcohol per week . He reports that he does not use  drugs. Allergies:  Allergies  Allergen Reactions  . Gabapentin Other (See Comments)    "Mental status/awareness changes"   Medications Prior to Admission  Medication Sig Dispense Refill  . atorvastatin (LIPITOR) 10 MG tablet Take 10 mg by mouth daily.    . B Complex-C (SUPER B COMPLEX PO) Take 1 tablet by mouth daily.    . Carboxymeth-Glycerin-Polysorb (REFRESH OPTIVE ADVANCED OP) Place 1  drop into both eyes every Monday, Wednesday, and Friday.     . Cholecalciferol (VITAMIN D3) 1000 units CAPS Take 1,000 Units by mouth daily.     . cyanocobalamin 1000 MCG tablet Take 1,000 mcg by mouth daily.    . meloxicam (MOBIC) 7.5 MG tablet Take 7.5 mg by mouth daily.    . Multiple Vitamin (MULTIVITAMIN) tablet Take 1 tablet by mouth daily. Senior formula    . Omega-3 Fatty Acids (FISH OIL PO) Take 1 capsule by mouth daily.    . vitamin C (ASCORBIC ACID) 500 MG tablet Take 500 mg by mouth daily.    Marland Kitchen gabapentin (NEURONTIN) 300 MG capsule Take 1 capsule (300 mg total) by mouth 3 (three) times daily. 90 capsule 2  . oxyCODONE-acetaminophen (PERCOCET) 7.5-325 MG tablet Take 1-2 tablets by mouth every 4 (four) hours as needed for severe pain. 60 tablet 0    Home: Home Living Family/patient expects to be discharged to:: Skilled nursing facility Living Arrangements: Spouse/significant other  Functional History: Prior Function Level of Independence: Independent Functional Status:  Mobility: Bed Mobility Overal bed mobility: Needs Assistance Bed Mobility: Sit to Sidelying, Rolling Rolling: Min guard Sit to sidelying: Min assist General bed mobility comments: v/c's for sequeincing, minA for trunk elevation Transfers Overall transfer level: Needs assistance Equipment used: Rolling walker (2 wheeled) Transfers: Sit to/from Stand Sit to Stand: Min assist General transfer comment: v/c's for safe hand placement Ambulation/Gait Ambulation/Gait assistance: Min assist Ambulation Distance (Feet): 150 Feet Assistive device: Rolling walker (2 wheeled) Gait Pattern/deviations: Step-through pattern, Decreased weight shift to left (flexed knee left with fatigue) General Gait Details: pt with increased bilat LE WBing/hard grip, v/c's to decrease grip. mild shakiness but able to complete reciprocal gait pattern without LOB Gait velocity: below    ADL:    Cognition: Cognition Overall  Cognitive Status: Within Functional Limits for tasks assessed Orientation Level: Oriented to person, Oriented to place, Oriented to situation, Disoriented to time Cognition Arousal/Alertness: Awake/alert Behavior During Therapy: Anxious (regarding incision dehising again) Overall Cognitive Status: Within Functional Limits for tasks assessed  Blood pressure (!) 147/86, pulse (!) 48, temperature (!) 97.3 F (36.3 C), resp. rate 20, height 6' (1.829 m), weight 82.1 kg (181 lb), SpO2 98 %. Physical Exam  Constitutional: He is oriented to person, place, and time. He appears well-developed and well-nourished.  HENT:  Head: Normocephalic and atraumatic.  Eyes: EOM are normal. Right eye exhibits no discharge. Left eye exhibits no discharge.  Neck: Normal range of motion. Neck supple. No thyromegaly present.  Cardiovascular: Normal rate, regular rhythm and normal heart sounds.   Respiratory: Effort normal and breath sounds normal. No respiratory distress.  GI: Soft. Bowel sounds are normal. He exhibits no distension.  Musculoskeletal: He exhibits no edema or tenderness.  Neurological: He is alert and oriented to person, place, and time.  HOH Motor: B/l UE 5/5 proximal to distal RLE: 4-/5 proximal to distal LLE: 4+/5 proximal to distal DTRs symmetrica Sensation intact to light touch  Skin: Skin is warm and dry.  Back incision dressed  Psychiatric: He  has a normal mood and affect. Thought content normal.    Results for orders placed or performed during the hospital encounter of 05/31/17 (from the past 24 hour(s))  CBC     Status: Abnormal   Collection Time: 06/01/17  5:45 PM  Result Value Ref Range   WBC 8.4 4.0 - 10.5 K/uL   RBC 3.10 (L) 4.22 - 5.81 MIL/uL   Hemoglobin 9.1 (L) 13.0 - 17.0 g/dL   HCT 26.9 (L) 39.0 - 52.0 %   MCV 86.8 78.0 - 100.0 fL   MCH 29.4 26.0 - 34.0 pg   MCHC 33.8 30.0 - 36.0 g/dL   RDW 14.4 11.5 - 15.5 %   Platelets 191 150 - 400 K/uL  Basic metabolic panel      Status: Abnormal   Collection Time: 06/01/17  5:45 PM  Result Value Ref Range   Sodium 130 (L) 135 - 145 mmol/L   Potassium 3.8 3.5 - 5.1 mmol/L   Chloride 96 (L) 101 - 111 mmol/L   CO2 26 22 - 32 mmol/L   Glucose, Bld 135 (H) 65 - 99 mg/dL   BUN 13 6 - 20 mg/dL   Creatinine, Ser 0.85 0.61 - 1.24 mg/dL   Calcium 7.9 (L) 8.9 - 10.3 mg/dL   GFR calc non Af Amer >60 >60 mL/min   GFR calc Af Amer >60 >60 mL/min   Anion gap 8 5 - 15   No results found.  Assessment/Plan: Diagnosis: Debility Labs independently reviewed.  Records reviewed and summated above.  1. Does the need for close, 24 hr/day medical supervision in concert with the patient's rehab needs make it unreasonable for this patient to be served in a less intensive setting? No 2. Co-Morbidities requiring supervision/potential complications: ABLA (transfuse as necessary to ensure appropriate perfusion for increased activity tolerance), Tachycardia (monitor in accordance with pain and increasing activity), recent L2-S1 laminectomy for decompression with L3-4, 4-5 and L5-S1, posterior lumbar interbody fusion, tachypnea (monitor RR and O2 Sats with increased physical exertion), post-op pain (Biofeedback training with therapies to help reduce reliance on opiate pain medications, monitor pain control during therapies, and sedation at rest and titrate to maximum efficacy to ensure participation and gains in therapies), hyponatremia (cont to monitor, treat if necessary) 3. Due to safety, skin/wound care, disease management, pain management and patient education, does the patient require 24 hr/day rehab nursing? No 4. Does the patient require coordinated care of a physician, rehab nurse, PT (1-2 hrs/day, 5 days/week) and OT (1-2 hrs/day, 5 days/week) to address physical and functional deficits in the context of the above medical diagnosis(es)? No Addressing deficits in the following areas: NA 5. Can the patient actively participate in an  intensive therapy program of at least 3 hrs of therapy per day at least 5 days per week? Yes 6. The potential for patient to make measurable gains while on inpatient rehab is good and fair 7. Anticipated functional outcomes upon discharge from inpatient rehab are n/a  with PT, n/a with OT, n/a with SLP. 8. Estimated rehab length of stay to reach the above functional goals is: NA 9. Anticipated D/C setting: Home 10. Anticipated post D/C treatments: HH therapy and Home excercise program 11. Overall Rehab/Functional Prognosis: excellent  RECOMMENDATIONS: This patient's condition is appropriate for continued rehabilitative care in the following setting: Pt making good functional gains.  Recommend home with [facility] once medically stable.   Patient has agreed to participate in recommended program. Potentially Note that insurance prior authorization  may be required for reimbursement for recommended care.  Comment: Rehab Admissions Coordinator to follow up.  Delice Lesch, MD, ABPMR Lauraine Rinne J., PA-C 06/02/2017

## 2017-06-02 NOTE — Progress Notes (Signed)
  Echocardiogram 2D Echocardiogram has been performed.  Glen [name] Glen [name] 06/02/2017, 2:10 PM

## 2017-06-02 NOTE — Progress Notes (Signed)
Physical Therapy Treatment Patient Details Name: Glen [name] MRN: [id number] DOB: 1936/06/15 Today's Date: 06/02/2017    History of Present Illness Patient is a 81 y.o. male who comes from a rehab facility in [place]. He is 4 days postop from an L2-S1 lami for decompression  And L5-S1 interbody fusion with posterior segmental fixation by Dr. Cyndy Freeze. Transferred here from [facility] where the physician there stated that his wound dehisced this morning when he was up walking.    PT Comments    Pt very pleasant with hearing aids applied and family present throughout session. Pt educated for sitting <30 min, sitting for meals, progression of mobility and gait, recommendation for home with family, positioning in bed and encouraged daily ambulation with staff. Answered all questions for family regarding positioning, mobility and safety with pt progressing with gait but unable to tolerate sitting in chair for lunch >5 min due to pain 7/10 with pt having received no pain meds today. Pt returned to bed and encouraged to premedicate for mobility and meals to progress activity tolerance.   HR 115 with brief jump to 133 and return to 108 end of session    Follow Up Recommendations  Home health PT;Supervision/Assistance - 24 hour     Equipment Recommendations  3in1 (PT);Rolling walker with 5" wheels;Other (comment) (shower seat)    Recommendations for Other Services       Precautions / Restrictions Precautions Precautions: Fall;Back Precaution Comments: pt able to recall from previous admission Required Braces or Orthoses:  (current order states brace not needed)    Mobility  Bed Mobility   Bed Mobility: Rolling;Sidelying to Sit;Sit to Sidelying Rolling: Min guard Sidelying to sit: Min guard     Sit to sidelying: Min guard General bed mobility comments: cues for maintaining all precautions without physical assist, use of rail and HOB flat  Transfers Overall transfer level: Needs  assistance   Transfers: Sit to/from Stand Sit to Stand: Min guard         General transfer comment: cues for hand placement and posture, pt with tendency to look down and initiate bending  Ambulation/Gait Ambulation/Gait assistance: Min guard Ambulation Distance (Feet): 205 Feet Assistive device: Rolling walker (2 wheeled) Gait Pattern/deviations: Step-through pattern   Gait velocity interpretation: at or above normal speed for age/gender General Gait Details: cues for posture, position in RW, safety and looking up. Pt running into one obstacle during session   Stairs            Wheelchair Mobility    Modified Rankin (Stroke Patients Only)       Balance Overall balance assessment: Needs assistance   Sitting balance-Leahy Scale: Good       Standing balance-Leahy Scale: Poor                              Cognition Arousal/Alertness: Awake/alert Behavior During Therapy: WFL for tasks assessed/performed Overall Cognitive Status: Within Functional Limits for tasks assessed                                        Exercises      General Comments        Pertinent Vitals/Pain Pain Score: 3  Pain Location: back supine, up to 7/10 in sitting with immediate relief with return to supine    Home Living  Prior Function            PT Goals (current goals can now be found in the care plan section) Progress towards PT goals: Progressing toward goals    Frequency    Min 4X/week      PT Plan Discharge plan needs to be updated;Frequency needs to be updated    Co-evaluation              AM-PAC PT "6 Clicks" Daily Activity  Outcome Measure  Difficulty turning over in bed (including adjusting bedclothes, sheets and blankets)?: A Lot Difficulty moving from lying on back to sitting on the side of the bed? : A Lot Difficulty sitting down on and standing up from a chair with arms (e.g.,  wheelchair, bedside commode, etc,.)?: A Little Help needed moving to and from a bed to chair (including a wheelchair)?: A Little Help needed walking in hospital room?: A Little Help needed climbing 3-5 steps with a railing? : A Little 6 Click Score: 16    End of Session   Activity Tolerance: Patient tolerated treatment well Patient left: in bed;with call bell/phone within [name];with family/visitor present Nurse Communication: Mobility status;Precautions PT Visit Diagnosis: Difficulty in walking, not elsewhere classified (R26.2)     Time: 2620-3559 PT Time Calculation (min) (ACUTE ONLY): 47 min  Charges:  $Gait Training: 8-22 mins $Therapeutic Activity: 23-37 mins                    G Codes:       Glen [name], Glen [name]    Glen [name] 06/02/2017, 1:11 PM

## 2017-06-03 DIAGNOSIS — I480 Paroxysmal atrial fibrillation: Secondary | ICD-10-CM

## 2017-06-03 NOTE — Progress Notes (Signed)
Patient requires RW, 3 in 1, and a hospital bed at home. Necessary for gait training and safe transferring and mobilization.

## 2017-06-03 NOTE — Care Management Note (Signed)
Case Management Note  Patient Details  Name: Glen [name] MRN: [id number] Date of Birth: 1936-01-15  Subjective/Objective:     Patient is a 81 y.o. male who comes from a rehab facility in [place]. He is 4 days postop from an L2-S1 lami for decompression  And L5-S1 interbody fusion with posterior segmental fixation by Dr. Cyndy Freeze. Transferred here from [facility] where the physician there stated that his wound dehisced this morning when he was up walking. 05/31/17 Irrigation and debridement of lumbar wound with primary reclosure and drain.                Action/Plan: Met with pt and his adult son, Baine Decesare, to discuss dc planning.  PT/OT recommending [place] with 24h supervision.  Son states they would like to dc home, and plan to hire 24h private duty caregivers for supervision, with supplemental HH therapies and recommended DME.  They would prefer [facility] services with [facility], as they have a family friend who works as a PT there, and would like DME arranged with [facility].  Faxed referrals to both [facility] and [facility] for arrangement of [place] and delivery of DME to pt's home.    Expected Discharge Date:   (to be determined)               Expected Discharge Plan:  [place]  In-House Referral:     Discharge planning Services  CM Consult  Post Acute Care Choice:  Durable Medical Equipment, Home Health Choice offered to:  Patient, Adult Children  DME Arranged:  3-N-1, Hospital bed, Walker rolling DME Agency:  [facility]  HH Arranged:  PT, OT [facility] Agency:  [facility]  Status of Service:  Completed, signed off  If discussed at [place] of Stay Meetings, dates discussed:    Additional Comments:  06/03/17 J. Shiza Thelen, RN, BSN Confirmed receipt of faxed orders to [facility] and DME agencies.  [facility] to call pt's son in AM to schedule delivery information.    Reinaldo Raddle, RN, BSN  Trauma/Neuro ICU Case Manager [phone]

## 2017-06-03 NOTE — Progress Notes (Signed)
Placed pt back on tele. Pt now back in A-fib. Pt asymptomatic, HR 85, BP 120/85. Restarted IV order of dextrose 5% and 0.45% NaCl + KCl 20 mEq/L @ 100 ml/hr. Will give scheduled metoprolol @ 2200. Will continue to monitor throughout night.

## 2017-06-03 NOTE — Progress Notes (Signed)
Progress Note  Patient Name: Glen [name] Date of Encounter: 06/03/2017  Primary Cardiologist: Dr Orpah Greek in [place], [place] Dr Debara Pickett while here  Patient Profile     81 y.o. male w/ hx GERD, HOH, SEM, HLD, DDD, Cataracts, was admitted 10/13 after lumbar back surgery. Cards seeing for rapid afib.   Subjective   No CP or palpitations, no SOB. Moving around a little more. Hopes to get the drain out and go home Weds/Thurs  Inpatient Medications    Scheduled Meds: . celecoxib  200 mg Oral Q12H  . cholecalciferol  1,000 Units Oral Daily  . metoprolol tartrate  12.5 mg Oral BID  . multivitamin with minerals  1 tablet Oral Daily  . senna  1 tablet Oral BID  . sodium chloride flush  3 mL Intravenous Q12H  . cyanocobalamin  1,000 mcg Oral Daily  . vitamin C  500 mg Oral Daily   Continuous Infusions: . sodium chloride Stopped (06/01/17 1000)  . 0.9 % NaCl with KCl 20 mEq / L    . dextrose 5 % and 0.45 % NaCl with KCl 20 mEq/L 100 mL/hr at 06/03/17 0306  . methocarbamol (ROBAXIN)  IV Stopped (05/31/17 1929)   PRN Meds: acetaminophen **OR** acetaminophen, labetalol, menthol-cetylpyridinium **OR** phenol, methocarbamol **OR** methocarbamol (ROBAXIN)  IV, ondansetron **OR** ondansetron (ZOFRAN) IV, oxyCODONE-acetaminophen **AND** oxyCODONE, sodium chloride flush   Vital Signs    Vitals:   06/02/17 2300 06/03/17 0000 06/03/17 0300 06/03/17 0400  BP:  139/86  (!) 158/88  Pulse:  67  71  Resp:  17  14  Temp: 98.1 F (36.7 C)  97.7 F (36.5 C)   TempSrc: Oral  Oral   SpO2:  92%  98%  Weight:      Height:        Intake/Output Summary (Last 24 hours) at 06/03/17 0750 Last data filed at 06/03/17 0700  Gross per 24 hour  Intake             2620 ml  Output             1320 ml  Net             1300 ml   Filed Weights   06/02/17 0805  Weight: 181 lb (82.1 kg)    Telemetry    Atrial fib >SR/PACs yesterday pm, overnight an episode of afib>>SR this am - Personally  Reviewed  ECG    10/14 at 7:49 am>>SR, HR 80, no acute changes - Personally Reviewed  Physical Exam   General: Well developed, well nourished, male appearing in no acute distress. Head: Normocephalic, atraumatic.  Neck: Supple without bruits, JVD not elevated. Lungs:  Resp regular and unlabored, clear bilaterally. Heart: RRR, S1, S2, no S3, S4, soft murmur; no rub. Abdomen: Soft, non-tender, non-distended with normoactive bowel sounds. No hepatomegaly. No rebound/guarding. No obvious abdominal masses. Incision in his back is dressed, drain in place Extremities: No clubbing, cyanosis, no edema. Distal pedal pulses are 2+ bilaterally. Neuro: Alert and oriented X 3. Moves all extremities spontaneously. Psych: Normal affect.  Labs    Hematology  Recent Labs Lab 05/31/17 1508 05/31/17 1835 06/01/17 1745  WBC 8.0 7.7 8.4  RBC 2.71* 3.21* 3.10*  HGB 8.1* 9.3* 9.1*  HCT 24.4* 28.5* 26.9*  MCV 90.0 88.8 86.8  MCH 29.9 29.0 29.4  MCHC 33.2 32.6 33.8  RDW 14.6 14.4 14.4  PLT 164 155 191    Chemistry  Recent Labs Lab 05/28/17 0229  05/31/17 1458 05/31/17 1508 06/01/17 1745  NA 136 138 139 130*  K 5.1 4.3 4.4 3.8  CL 104 103 105 96*  CO2 19*  --  27 26  GLUCOSE 257* 111* 110* 135*  BUN 19 15 14 13   CREATININE 1.59* 0.90 0.96 0.85  CALCIUM 7.7*  --  8.1* 7.9*  GFRNONAA 40*  --  >60 >60  GFRAA 46*  --  >60 >60  ANIONGAP 13  --  7 8     Cardiac Enzymes  Recent Labs Lab 05/31/17 1835 06/01/17 0035 06/01/17 0736  TROPONINI <0.03 <0.03 <0.03    Radiology    No results found.   Cardiac Studies   ECHO: 06/02/2017  - Left ventricle: The cavity size was normal. Systolic function was   normal. The estimated ejection fraction was in the range of 60%   to 65%. Wall motion was normal; there were no regional wall   motion abnormalities. The study is not technically sufficient to   allow evaluation of LV diastolic function. - Aortic valve: Not well seen likely tri  leaflet - Left atrium: The atrium was mildly dilated. - Right atrium:  The atrium was normal in size - Atrial septum: No defect or patent foramen ovale was identified.  ECHO: 10/2016 BY DR Sabra Heck, in paper chart, EF nl  Patient Profile     81 y.o. male w/ hx  GERD, HOH, SEM, HLD, DDD, Cataracts, was admitted 10/13 after lumbar back surgery. Cards seeing for rapid afib.   Assessment & Plan    1. Rapid atrial fib - PTA no BB/CCB - low-dose metoprolol added 10/15, tolerated well. - he may have had this before since no sx - CHADS2VASC= 2 (age x 2)  - Discussed risks/benefits of anticoagulation, he would be a candidate for NOAC when cleared by surgeons - MD advise if we should start or have pt f/u with Dr Sabra Heck and let him decide. - consider event monitor to see if pt having afib as outpt. - no further inpt cardiac workup  Otherwise, per Dr Cyndy Freeze Active Problems:   Wound dehiscence   Acute blood loss anemia   Tachycardia   S/P lumbar fusion   Post-operative pain   Hyponatremia    Signed, Rosaria Ferries , PA-C 7:50 AM 06/03/2017 Pager: [phone]

## 2017-06-03 NOTE — Progress Notes (Signed)
NEUROSURGERY PROGRESS NOTE  Doing well. Complains of appropriate back soreness and some groin pain Ambulating and voiding well Good strength and sensation Incision CDI. Drain put out 450cc yesterday  Temp:  [97.4 F (36.3 C)-98.4 F (36.9 C)] 97.7 F (36.5 C) (10/16 0300) Pulse Rate:  [67-163] 71 (10/16 0400) Resp:  [14-29] 14 (10/16 0400) BP: (122-158)/(74-110) 158/88 (10/16 0400) SpO2:  [92 %-100 %] 98 % (10/16 0400)  Plan: Possible DC tomorrow. Will keep him here another day because of high drain output  Eleonore Chiquito, NP 06/03/2017 8:13 AM

## 2017-06-04 DIAGNOSIS — I480 Paroxysmal atrial fibrillation: Secondary | ICD-10-CM

## 2017-06-04 MED ORDER — CLONAZEPAM 0.5 MG PO TABS
0.2500 mg | ORAL_TABLET | Freq: Once | ORAL | Status: AC
  Administered 2017-06-04: 0.25 mg via ORAL
  Filled 2017-06-04: qty 1

## 2017-06-04 MED ORDER — METOPROLOL TARTRATE 25 MG PO TABS: 12.5000 mg | ORAL_TABLET | Freq: Two times a day (BID) | ORAL | 2 refills | Status: AC

## 2017-06-04 NOTE — Progress Notes (Addendum)
Pt in NSR with 1st degree heart block. HR 66. Will continue to monitor.

## 2017-06-04 NOTE — Progress Notes (Signed)
Physical Therapy Treatment Patient Details Name: Glen [name] MRN: [id number] DOB: 1936-04-19 Today's Date: 06/04/2017    History of Present Illness Patient is a 81 y.o. male who comes from a rehab facility in [place]. He is 4 days postop from an L2-S1 lami for decompression  And L5-S1 interbody fusion with posterior segmental fixation by Dr. Cyndy Freeze. Transferred here from [facility] where the physician there stated that his wound dehisced this morning when he was up walking. 05/31/17 Irrigation and debridement of lumbar wound with primary reclosure and drain    PT Comments    Pt is progressing well with gait and mobility.  He was able to do stair training with his daughter's hand held assist to demonstrate safe entry into his home.  Family questions answered.  From a mobility standpoint he is ready for d/c.  PT to follow acutely until d/c confirmed.     Follow Up Recommendations  Home health PT;Supervision/Assistance - 24 hour     Equipment Recommendations  3in1 (PT);Rolling walker with 5" wheels;Other (comment);Hospital bed (shower seat)    Recommendations for Other Services   NA     Precautions / Restrictions Precautions Precautions: Fall;Back Precaution Booklet Issued: Yes (comment) Precaution Comments: handout given in previous session, reviewed no BLT  Spinal Brace:  (per pt/family they don't have to wear the brace) Restrictions Weight Bearing Restrictions: No    Mobility  Bed Mobility Overal bed mobility: Modified Independent Bed Mobility: Rolling;Sidelying to Sit Rolling: Modified independent (Device/Increase time) Sidelying to sit: Modified independent (Device/Increase time)       General bed mobility comments: HOB mildly elevated and pt using bed rail for leverage.   Transfers Overall transfer level: Needs assistance Equipment used: Rolling walker (2 wheeled) Transfers: Sit to/from Stand Sit to Stand: Modified independent (Device/Increase time)          General transfer comment: Pt able to walk with RW mod I, good fast, but safe speed. Cues to bring the RW with him and not park it and then go to where he wants to get to a few feet away.   Ambulation/Gait Ambulation/Gait assistance: Supervision Ambulation Distance (Feet): 120 Feet Assistive device: Rolling walker (2 wheeled) Gait Pattern/deviations: WFL(Within Functional Limits)   Gait velocity interpretation: at or above normal speed for age/gender General Gait Details: Pt with better posture, stable LEs, fast, but safe speed.  Supervision for safety.    Stairs Stairs: Yes   Stair Management: No rails;Forwards;Step to pattern Number of Stairs: 2 (x2 once with PT, once with daughter) General stair comments: educated pt/family re: safest LE sequencing.  Min hand held assist for balance as pt will not have a rail at home to use.         Balance Overall balance assessment: Needs assistance Sitting-balance support: Feet supported;No upper extremity supported Sitting balance-Leahy Scale: Good     Standing balance support: No upper extremity supported Standing balance-Leahy Scale: Good Standing balance comment: stand at sink unsupported with supervision.                             Cognition Arousal/Alertness: Awake/alert Behavior During Therapy: WFL for tasks assessed/performed Overall Cognitive Status: Within Functional Limits for tasks assessed                                 General Comments: Cognition back to normal compared to previous admission.  Family reports back to normal as well.          General Comments General comments (skin integrity, edema, etc.): Lenghty discussion with pt/family re: fall precautions (throw rugs, night lights), appropriate sitting surfaces, bed, and home equipment.       Pertinent Vitals/Pain Pain Assessment: Faces Faces Pain Scale: Hurts a little bit Pain Location: back Pain Descriptors / Indicators:  Guarding;Grimacing Pain Intervention(s): Limited activity within patient's tolerance;Monitored during session;Repositioned           PT Goals (current goals can now be found in the care plan section) Acute Rehab PT Goals Patient Stated Goal: to go home Progress towards PT goals: Progressing toward goals    Frequency    Min 4X/week      PT Plan Current plan remains appropriate       AM-PAC PT "6 Clicks" Daily Activity  Outcome Measure  Difficulty turning over in bed (including adjusting bedclothes, sheets and blankets)?: A Little Difficulty moving from lying on back to sitting on the side of the bed? : A Little Difficulty sitting down on and standing up from a chair with arms (e.g., wheelchair, bedside commode, etc,.)?: None Help needed moving to and from a bed to chair (including a wheelchair)?: None Help needed walking in hospital room?: None Help needed climbing 3-5 steps with a railing? : A Little 6 Click Score: 21    End of Session   Activity Tolerance: Patient tolerated treatment well Patient left: in chair;with call bell/phone within reach;with family/visitor present Nurse Communication: Mobility status PT Visit Diagnosis: Difficulty in walking, not elsewhere classified (R26.2) Pain - Right/Left:  (lower back)     Time: 7672-0947 PT Time Calculation (min) (ACUTE ONLY): 24 min  Charges:  $Gait Training: 8-22 mins $Self Care/Home Management: 8-22          Maribel Hadley B. Carel Carrier, PT, DPT [phone]           06/04/2017, 10:40 AM

## 2017-06-04 NOTE — Discharge Summary (Signed)
Physician Discharge Summary  Patient ID: Glen [name] MRN: [id number] DOB/AGE: [birth year] 81 y.o.  Admit date: 05/31/2017 Discharge date: 06/04/2017  Admission Diagnoses: wound dehiscence    Discharge Diagnoses: same as admitting   Discharged Condition: good  Hospital Course: The patient was admitted on 05/31/2017 and taken to the operating room where the patient underwent wound washout, debridement, and repair. The patient tolerated the procedure well and was taken to the recovery room and then to the floor in stable condition. The hospital course was routine. There were no complications. The wound remained clean dry and intact. Pt had appropriate back soreness. No complaints of leg pain or new N/T/W. The patient remained afebrile with stable vital signs, and tolerated a regular diet. The patient continued to increase activities, and pain was well controlled with oral pain medications.   Consults: cardiology  Significant Diagnostic Studies:  Results for orders placed or performed during the hospital encounter of 05/31/17  CBC  Result Value Ref Range   WBC 8.0 4.0 - 10.5 K/uL   RBC 2.71 (L) 4.22 - 5.81 MIL/uL   Hemoglobin 8.1 (L) 13.0 - 17.0 g/dL   HCT 24.4 (L) 39.0 - 52.0 %   MCV 90.0 78.0 - 100.0 fL   MCH 29.9 26.0 - 34.0 pg   MCHC 33.2 30.0 - 36.0 g/dL   RDW 14.6 11.5 - 15.5 %   Platelets 164 150 - 400 K/uL  Basic metabolic panel  Result Value Ref Range   Sodium 139 135 - 145 mmol/L   Potassium 4.4 3.5 - 5.1 mmol/L   Chloride 105 101 - 111 mmol/L   CO2 27 22 - 32 mmol/L   Glucose, Bld 110 (H) 65 - 99 mg/dL   BUN 14 6 - 20 mg/dL   Creatinine, Ser 0.96 0.61 - 1.24 mg/dL   Calcium 8.1 (L) 8.9 - 10.3 mg/dL   GFR calc non Af Amer >60 >60 mL/min   GFR calc Af Amer >60 >60 mL/min   Anion gap 7 5 - 15  CBC  Result Value Ref Range   WBC 7.7 4.0 - 10.5 K/uL   RBC 3.21 (L) 4.22 - 5.81 MIL/uL   Hemoglobin 9.3 (L) 13.0 - 17.0 g/dL   HCT 28.5 (L) 39.0 - 52.0 %   MCV 88.8 78.0  - 100.0 fL   MCH 29.0 26.0 - 34.0 pg   MCHC 32.6 30.0 - 36.0 g/dL   RDW 14.4 11.5 - 15.5 %   Platelets 155 150 - 400 K/uL  CK total and CKMB (cardiac)not at [facility]  Result Value Ref Range   Total CK 269 49 - 397 U/L   CK, MB 2.9 0.5 - 5.0 ng/mL   Relative Index 1.1 0.0 - 2.5  Troponin I  Result Value Ref Range   Troponin I <0.03 <0.03 ng/mL  Troponin I  Result Value Ref Range   Troponin I <0.03 <0.03 ng/mL  Troponin I  Result Value Ref Range   Troponin I <0.03 <0.03 ng/mL  CBC  Result Value Ref Range   WBC 8.4 4.0 - 10.5 K/uL   RBC 3.10 (L) 4.22 - 5.81 MIL/uL   Hemoglobin 9.1 (L) 13.0 - 17.0 g/dL   HCT 26.9 (L) 39.0 - 52.0 %   MCV 86.8 78.0 - 100.0 fL   MCH 29.4 26.0 - 34.0 pg   MCHC 33.8 30.0 - 36.0 g/dL   RDW 14.4 11.5 - 15.5 %   Platelets 191 150 - 400 K/uL  Basic metabolic  panel  Result Value Ref Range   Sodium 130 (L) 135 - 145 mmol/L   Potassium 3.8 3.5 - 5.1 mmol/L   Chloride 96 (L) 101 - 111 mmol/L   CO2 26 22 - 32 mmol/L   Glucose, Bld 135 (H) 65 - 99 mg/dL   BUN 13 6 - 20 mg/dL   Creatinine, Ser 0.85 0.61 - 1.24 mg/dL   Calcium 7.9 (L) 8.9 - 10.3 mg/dL   GFR calc non Af Amer >60 >60 mL/min   GFR calc Af Amer >60 >60 mL/min   Anion gap 8 5 - 15  I-STAT, chem 8  Result Value Ref Range   Sodium 138 135 - 145 mmol/L   Potassium 4.3 3.5 - 5.1 mmol/L   Chloride 103 101 - 111 mmol/L   BUN 15 6 - 20 mg/dL   Creatinine, Ser 0.90 0.61 - 1.24 mg/dL   Glucose, Bld 111 (H) 65 - 99 mg/dL   Calcium, Ion 1.02 (L) 1.15 - 1.40 mmol/L   TCO2 25 22 - 32 mmol/L   Hemoglobin 7.5 (L) 13.0 - 17.0 g/dL   HCT 22.0 (L) 39.0 - 52.0 %  ECHOCARDIOGRAM COMPLETE  Result Value Ref Range   Weight 2,896 oz   Height 72 in   BP 139/103 mmHg   LV PW d 10 (A) 0.6 - 1.1 mm   FS 16 (A) 28 - 44 %   LA vol 69.1 mL   Ao-asc 37 cm   LA ID, A-P, ES 43 mm   IVS/LV PW RATIO, ED 1    LV e' LATERAL 14.7 cm/s   LA diam index 2.1 cm/m2   LA vol A4C 54.2 ml   E decel time 123 msec   LVOT  diameter 19 mm   LVOT area 2.84 cm2   MV pk E vel 1.8 m/s   LA vol index 33.7 mL/m2   MV Dec 123    LA diam end sys 43.00 mm   TDI e' medial 11.00    TDI e' lateral 14.70    Lateral S' vel 12.90 cm/sec   TAPSE 21.40 mm  Type and screen [place]  Result Value Ref Range   ABO/RH(D) B POS    Antibody Screen NEG    Sample Expiration 06/03/2017    Unit Number [id number]    Blood Component Type RED CELLS,LR    Unit division 00    Status of Unit ISSUED,FINAL    Transfusion Status OK TO TRANSFUSE    Crossmatch Result Compatible    Unit Number [id number]    Blood Component Type RED CELLS,LR    Unit division 00    Status of Unit ISSUED,FINAL    Transfusion Status OK TO TRANSFUSE    Crossmatch Result Compatible   Prepare RBC  Result Value Ref Range   Order Confirmation ORDER PROCESSED BY BLOOD BANK   BPAM RBC  Result Value Ref Range   ISSUE DATE / TIME [id number]    Blood Product Unit Number [id number]    PRODUCT CODE [id number]    Unit Type and Rh 7300    Blood Product Expiration Date [id number]    ISSUE DATE / TIME [id number]    Blood Product Unit Number [id number]    PRODUCT CODE [id number]    Unit Type and Rh 7300    Blood Product Expiration Date [id number]     Dg Lumbar Spine 2-3 Views  Result Date: 05/27/2017 CLINICAL DATA:  81 year old male undergoing L2-S1 fusion.  EXAM: DG C-ARM GT 120 MIN; LUMBAR SPINE - 2-3 VIEW COMPARISON:  Lumbar spine radiograph 04/24/2017. FLUOROSCOPY TIME:  Fluoroscopy Time:  29 seconds Number of Acquired Spot Images: 2 FINDINGS: Two limited highly coned views of the lumbar spine are submitted for evaluation. Because of this, accurate assessment of spinal levels is not possible. However, based on several anatomical landmarks, new orthopedic fixation hardware appears to be in place extending from L2-S2 with posterior rod and pedicle screw fixation devices in place at these levels. Fixation screws at S2 appeared to  be long, likely traversing the sacroiliac joints bilaterally. Interbody cage at L5-S1. Posteriorly, soft tissue retractors are in place. IMPRESSION: 1. Intraoperative documentation of PLIF, which appears to extend from L2 through S2, as above. Electronically Signed   By: Vinnie Langton M.D.   On: 05/27/2017 20:01   Ct Thoracic Spine Wo Contrast  Result Date: 05/09/2017 CLINICAL DATA:  81 year old male with lumbar spondylosis. Preoperative study for stereotactic surgical planning. EXAM: CT THORACIC AND LUMBAR SPINE WITHOUT CONTRAST TECHNIQUE: Multidetector CT imaging of the thoracic and lumbar spine was performed without contrast. Multiplanar CT image reconstructions were also generated. COMPARISON:  [place] [facility] lumbar radiographs 04/24/2017. Outside lumbar MRI available on [facility] PACS 03/07/2017 FINDINGS: CT THORACIC SPINE FINDINGS Segmentation: Normal. Alignment: Relatively normal thoracic vertebral height and alignment. Slight levoconvex thoracic curvature. Vertebrae: No acute osseous abnormality identified. Multilevel anterior mid and lower thoracic endplate degenerative spurring. Visible posterior ribs are intact. Paraspinal and other soft tissues: Central airways are patent. No pericardial or pleural effusion. Calcified coronary artery atherosclerosis. No lymphadenopathy in the visible mediastinum. Small fat containing Bochdalek hernia on the right (normal variant). Disc levels: T1-T2: Mild facet hypertrophy. T2-T3: Mild facet hypertrophy. Small calcified central disc protrusion. No stenosis. T3-T4: Negative. T4-T5: Negative. T5-T6: Mild to moderate facet and ligament flavum hypertrophy. Some ligament flavum calcification. Bilateral vacuum facet. Trace vacuum disc anteriorly. Borderline to mild bilateral T5 foraminal stenosis. T6-T7: Vacuum disc. Mild facet hypertrophy. No spinal stenosis. Borderline to mild T6 foraminal stenosis. T7-T8: Partially calcified mild ligament flavum hypertrophy. No  stenosis. T8-T9: Unusual appearing two paracentral linear densities tracking along the posterior T8 vertebral body and continuing to T9 (series 4, image 87). See also sagittal images 31 and 32. Superimposed short-segment focal calcification of the posterior dura or thecal sac (also on series 4, image 87). Mostly anterior disc bulging and endplate spurring at this level. Mild narrowing of the thecal sac related to these findings but no foraminal stenosis. T9-T10: The unusual linear ventral density stops at the T9-T10 disc space. Right anterior disc bulge and endplate spurring with mild vacuum phenomena. No stenosis. T10-T11: Right anterior disc bulge and endplate spurring. No stenosis. T11-T12: Lesser anterior disc and endplate changes.  No stenosis. T12-L1: Anterior disc and endplate changes with mild vacuum disc. Mild facet hypertrophy greater on the left. No stenosis. CT LUMBAR SPINE FINDINGS Segmentation: Normal aside from subtle hypoplastic ribs at L1 (series 4, image 162). Alignment: Grade 1 anterolisthesis of L5 on S1 measuring 4 mm. Dextroconvex lumbar scoliosis. Straightening of lumbar lordosis. Mild retrolisthesis of L2 on L3 through L4 on L5. Stable vertebral height and alignment since the July MRI. Vertebrae: Chronic appearing transverse process fractures on the right at L2 and L3, healed. Advanced degenerative appearing sclerosis of the L2, L4 endplates, and the L3 vertebral body. No acute osseous abnormality identified. Intact sacrum and SI joints. Paraspinal and other soft tissues: Aortoiliac calcified atherosclerosis. Negative visualized noncontrast abdominal and pelvic viscera. Negative  visualized posterior paraspinal soft tissues. Disc levels: L1-L2: Mild far lateral disc bulging and endplate spurring. No stenosis. L2-L3: Severe disc space loss and vacuum disc. Advanced endplate sclerosis. Circumferential endplate spurring. Up to mild spinal stenosis. Mild to moderate bilateral L2 foraminal  stenosis. L3-L4: Severe disc space loss and vacuum disc. Circumferential but more so far lateral disc osteophyte complex with broad-based posterior component. Mild to moderate facet hypertrophy. Mild spinal stenosis. Moderate to severe left greater than right L3 foraminal stenosis. L4-L5: Advanced disc space loss and vacuum disc. Circumferential disc bulge and endplate spurring with broad-based posterior component. Moderate to severe facet and ligament flavum hypertrophy. Mild spinal stenosis. Mild left and moderate right L4 foraminal stenosis. L5-S1: Grade 1 anterolisthesis. Circumferential disc bulge with vacuum disc. Very severe facet hypertrophy, worse on the right. Prior left laminectomy changes (series 4, image 231). Mild spinal stenosis just below the disc space level was better demonstrated by MRI. There is mild to moderate L5 foraminal stenosis, greater on the right. IMPRESSION: CT THORACIC SPINE IMPRESSION 1.  No acute osseous abnormality in the thoracic spine. 2. Mild for age thoracic disc degeneration, mostly with anterior disc and endplate changes in the mid and lower thoracic spine. There is vacuum disc at T6-T7. 3. Unusual ventral linear density tracking posterior to the T8 and T9 vertebral bodies, somewhat resembling disconnected spinal stimulator leads, although with ventral rather than dorsal positioning. Superimposed short-segment dorsal thecal sac or dural calcification at T8-T9. Up to mild associated spinal stenosis at that level. 4. Upper thoracic facet degeneration. Only mild associated thoracic foraminal stenosis. CT LUMBAR SPINE IMPRESSION 1.  No acute osseous abnormality in lumbar spine. 2. Mild dextroconvex lumbar scoliosis and multilevel mild spondylolisthesis. Very advanced disc and endplate degeneration T4-H9 through L4-L5. Very severe chronic facet degeneration at L5-S1. 3. Stable multifactorial mild lumbar spinal stenosis since the July MRI. 4. Mild for age calcified aortic  atherosclerosis. Electronically Signed   By: Genevie Ann M.D.   On: 05/09/2017 10:12   Ct Lumbar Spine Wo Contrast  Result Date: 05/09/2017 CLINICAL DATA:  81 year old male with lumbar spondylosis. Preoperative study for stereotactic surgical planning. EXAM: CT THORACIC AND LUMBAR SPINE WITHOUT CONTRAST TECHNIQUE: Multidetector CT imaging of the thoracic and lumbar spine was performed without contrast. Multiplanar CT image reconstructions were also generated. COMPARISON:  [place] [facility] lumbar radiographs 04/24/2017. Outside lumbar MRI available on [facility] PACS 03/07/2017 FINDINGS: CT THORACIC SPINE FINDINGS Segmentation: Normal. Alignment: Relatively normal thoracic vertebral height and alignment. Slight levoconvex thoracic curvature. Vertebrae: No acute osseous abnormality identified. Multilevel anterior mid and lower thoracic endplate degenerative spurring. Visible posterior ribs are intact. Paraspinal and other soft tissues: Central airways are patent. No pericardial or pleural effusion. Calcified coronary artery atherosclerosis. No lymphadenopathy in the visible mediastinum. Small fat containing Bochdalek hernia on the right (normal variant). Disc levels: T1-T2: Mild facet hypertrophy. T2-T3: Mild facet hypertrophy. Small calcified central disc protrusion. No stenosis. T3-T4: Negative. T4-T5: Negative. T5-T6: Mild to moderate facet and ligament flavum hypertrophy. Some ligament flavum calcification. Bilateral vacuum facet. Trace vacuum disc anteriorly. Borderline to mild bilateral T5 foraminal stenosis. T6-T7: Vacuum disc. Mild facet hypertrophy. No spinal stenosis. Borderline to mild T6 foraminal stenosis. T7-T8: Partially calcified mild ligament flavum hypertrophy. No stenosis. T8-T9: Unusual appearing two paracentral linear densities tracking along the posterior T8 vertebral body and continuing to T9 (series 4, image 87). See also sagittal images 31 and 32. Superimposed short-segment focal  calcification of the posterior dura or thecal sac (also on  series 4, image 87). Mostly anterior disc bulging and endplate spurring at this level. Mild narrowing of the thecal sac related to these findings but no foraminal stenosis. T9-T10: The unusual linear ventral density stops at the T9-T10 disc space. Right anterior disc bulge and endplate spurring with mild vacuum phenomena. No stenosis. T10-T11: Right anterior disc bulge and endplate spurring. No stenosis. T11-T12: Lesser anterior disc and endplate changes.  No stenosis. T12-L1: Anterior disc and endplate changes with mild vacuum disc. Mild facet hypertrophy greater on the left. No stenosis. CT LUMBAR SPINE FINDINGS Segmentation: Normal aside from subtle hypoplastic ribs at L1 (series 4, image 162). Alignment: Grade 1 anterolisthesis of L5 on S1 measuring 4 mm. Dextroconvex lumbar scoliosis. Straightening of lumbar lordosis. Mild retrolisthesis of L2 on L3 through L4 on L5. Stable vertebral height and alignment since the July MRI. Vertebrae: Chronic appearing transverse process fractures on the right at L2 and L3, healed. Advanced degenerative appearing sclerosis of the L2, L4 endplates, and the L3 vertebral body. No acute osseous abnormality identified. Intact sacrum and SI joints. Paraspinal and other soft tissues: Aortoiliac calcified atherosclerosis. Negative visualized noncontrast abdominal and pelvic viscera. Negative visualized posterior paraspinal soft tissues. Disc levels: L1-L2: Mild far lateral disc bulging and endplate spurring. No stenosis. L2-L3: Severe disc space loss and vacuum disc. Advanced endplate sclerosis. Circumferential endplate spurring. Up to mild spinal stenosis. Mild to moderate bilateral L2 foraminal stenosis. L3-L4: Severe disc space loss and vacuum disc. Circumferential but more so far lateral disc osteophyte complex with broad-based posterior component. Mild to moderate facet hypertrophy. Mild spinal stenosis. Moderate to severe  left greater than right L3 foraminal stenosis. L4-L5: Advanced disc space loss and vacuum disc. Circumferential disc bulge and endplate spurring with broad-based posterior component. Moderate to severe facet and ligament flavum hypertrophy. Mild spinal stenosis. Mild left and moderate right L4 foraminal stenosis. L5-S1: Grade 1 anterolisthesis. Circumferential disc bulge with vacuum disc. Very severe facet hypertrophy, worse on the right. Prior left laminectomy changes (series 4, image 231). Mild spinal stenosis just below the disc space level was better demonstrated by MRI. There is mild to moderate L5 foraminal stenosis, greater on the right. IMPRESSION: CT THORACIC SPINE IMPRESSION 1.  No acute osseous abnormality in the thoracic spine. 2. Mild for age thoracic disc degeneration, mostly with anterior disc and endplate changes in the mid and lower thoracic spine. There is vacuum disc at T6-T7. 3. Unusual ventral linear density tracking posterior to the T8 and T9 vertebral bodies, somewhat resembling disconnected spinal stimulator leads, although with ventral rather than dorsal positioning. Superimposed short-segment dorsal thecal sac or dural calcification at T8-T9. Up to mild associated spinal stenosis at that level. 4. Upper thoracic facet degeneration. Only mild associated thoracic foraminal stenosis. CT LUMBAR SPINE IMPRESSION 1.  No acute osseous abnormality in lumbar spine. 2. Mild dextroconvex lumbar scoliosis and multilevel mild spondylolisthesis. Very advanced disc and endplate degeneration I2-L7 through L4-L5. Very severe chronic facet degeneration at L5-S1. 3. Stable multifactorial mild lumbar spinal stenosis since the July MRI. 4. Mild for age calcified aortic atherosclerosis. Electronically Signed   By: Genevie Ann M.D.   On: 05/09/2017 10:12   Dg C-arm Gt 120 Min  Result Date: 05/27/2017 CLINICAL DATA:  81 year old male undergoing L2-S1 fusion. EXAM: DG C-ARM GT 120 MIN; LUMBAR SPINE - 2-3 VIEW  COMPARISON:  Lumbar spine radiograph 04/24/2017. FLUOROSCOPY TIME:  Fluoroscopy Time:  29 seconds Number of Acquired Spot Images: 2 FINDINGS: Two limited highly coned views of  the lumbar spine are submitted for evaluation. Because of this, accurate assessment of spinal levels is not possible. However, based on several anatomical landmarks, new orthopedic fixation hardware appears to be in place extending from L2-S2 with posterior rod and pedicle screw fixation devices in place at these levels. Fixation screws at S2 appeared to be long, likely traversing the sacroiliac joints bilaterally. Interbody cage at L5-S1. Posteriorly, soft tissue retractors are in place. IMPRESSION: 1. Intraoperative documentation of PLIF, which appears to extend from L2 through S2, as above. Electronically Signed   By: Vinnie Langton M.D.   On: 05/27/2017 20:01    Antibiotics:  Anti-infectives    Start     Dose/Rate Route Frequency Ordered Stop   05/31/17 2200  ceFAZolin (ANCEF) IVPB 2g/100 mL premix     2 g 200 mL/hr over 30 Minutes Intravenous Every 8 hours 05/31/17 1942 06/01/17 0700   05/31/17 1620  vancomycin (VANCOCIN) powder  Status:  Discontinued       As needed 05/31/17 1620 05/31/17 1715   05/31/17 1615  cefTRIAXone (ROCEPHIN) 2 g in dextrose 5 % 50 mL IVPB     2 g 100 mL/hr over 30 Minutes Intravenous  Once 05/31/17 1604 05/31/17 1625   05/31/17 1615  vancomycin (VANCOCIN) IVPB 1000 mg/200 mL premix     1,000 mg 200 mL/hr over 60 Minutes Intravenous  Once 05/31/17 1604 05/31/17 1607   05/31/17 1606  bacitracin 50,000 Units in sodium chloride irrigation 0.9 % 500 mL irrigation  Status:  Discontinued       As needed 05/31/17 1621 05/31/17 1715      Discharge Exam: Blood pressure 136/70, pulse 65, temperature 97.8 F (36.6 C), temperature source Oral, resp. rate (!) 21, height 6' (1.829 m), weight 82.1 kg (181 lb), SpO2 93 %. Neurologic: Grossly normal Ambulating and voiding well  Discharge Medications:    Allergies as of 06/04/2017      Reactions   Gabapentin Other (See Comments)   "Mental status/awareness changes"      Medication List    TAKE these medications   atorvastatin 10 MG tablet Commonly known as:  LIPITOR Take 10 mg by mouth daily.   cyanocobalamin 1000 MCG tablet Take 1,000 mcg by mouth daily.   FISH OIL PO Take 1 capsule by mouth daily.   gabapentin 300 MG capsule Commonly known as:  NEURONTIN Take 1 capsule (300 mg total) by mouth 3 (three) times daily.   meloxicam 7.5 MG tablet Commonly known as:  MOBIC Take 7.5 mg by mouth daily.   metoprolol tartrate 25 MG tablet Commonly known as:  LOPRESSOR Take 0.5 tablets (12.5 mg total) by mouth 2 (two) times daily.   multivitamin tablet Take 1 tablet by mouth daily. Senior formula   oxyCODONE-acetaminophen 7.5-325 MG tablet Commonly known as:  PERCOCET Take 1-2 tablets by mouth every 4 (four) hours as needed for severe pain.   REFRESH OPTIVE ADVANCED OP Place 1 drop into both eyes every Monday, Wednesday, and Friday.   SUPER B COMPLEX PO Take 1 tablet by mouth daily.   vitamin C 500 MG tablet Commonly known as:  ASCORBIC ACID Take 500 mg by mouth daily.   Vitamin D3 1000 units Caps Take 1,000 Units by mouth daily.            Durable Medical Equipment        Start     Ordered   06/03/17 1449  For home use only DME Walker rolling  Once  Question:  Patient needs a walker to treat with the following condition  Answer:  Lumbosacral spondylosis   06/03/17 1449   06/03/17 1447  For home use only DME Bedside commode  Once    Question:  Patient needs a bedside commode to treat with the following condition  Answer:  Lumbosacral spondylosis   06/03/17 1449   06/03/17 1445  For home use only DME Hospital bed  Once    Question Answer Comment  Patient has (list medical condition): lumbosacral spondylosis with severe stenosis and a spondylolisthesis at L5-S1   The above medical condition requires:  Patient requires the ability to reposition frequently   Head must be elevated greater than: 30 degrees   Bed type Semi-electric      06/03/17 1449      Disposition: home   Final Dx: same as admitting  Discharge Instructions     Remove dressing in 72 hours    Complete by:  As directed    Call MD for:  difficulty breathing, headache or visual disturbances    Complete by:  As directed    Call MD for:  extreme fatigue    Complete by:  As directed    Call MD for:  hives    Complete by:  As directed    Call MD for:  persistant dizziness or light-headedness    Complete by:  As directed    Call MD for:  persistant nausea and vomiting    Complete by:  As directed    Call MD for:  redness, tenderness, or signs of infection (pain, swelling, redness, odor or green/yellow discharge around incision site)    Complete by:  As directed    Call MD for:  severe uncontrolled pain    Complete by:  As directed    Call MD for:  temperature >100.4    Complete by:  As directed    Diet - low sodium heart healthy    Complete by:  As directed    Increase activity slowly    Complete by:  As directed    Lifting restrictions    Complete by:  As directed    No lifting more than 8 lbs         Signed: Ocie Cornfield Anayia Eugene 06/04/2017, 8:18 AM

## 2017-06-04 NOTE — Progress Notes (Signed)
Discharge instructions given to patient and wife. All questions answered.

## 2018-01-26 ENCOUNTER — Encounter: Payer: Self-pay | Admitting: Internal Medicine

## 2018-02-10 ENCOUNTER — Encounter: Payer: Self-pay | Admitting: Internal Medicine

## 2018-02-11 ENCOUNTER — Encounter: Payer: Self-pay | Admitting: Internal Medicine

## 2018-03-20 ENCOUNTER — Ambulatory Visit (AMBULATORY_SURGERY_CENTER): Payer: Self-pay

## 2018-03-20 VITALS — Ht 72.0 in | Wt 180.8 lb

## 2018-03-20 DIAGNOSIS — D369 Benign neoplasm, unspecified site: Secondary | ICD-10-CM

## 2018-03-20 NOTE — Progress Notes (Signed)
Per pt, no allergies to soy or egg products.Pt not taking any weight loss meds or using  O2 at home.  Pt refused emmi video. 

## 2018-04-03 ENCOUNTER — Encounter: Payer: Medicare Other | Admitting: Internal Medicine

## 2018-04-16 ENCOUNTER — Ambulatory Visit (AMBULATORY_SURGERY_CENTER): Payer: Medicare Other | Admitting: Internal Medicine

## 2018-04-16 ENCOUNTER — Encounter: Payer: Self-pay | Admitting: Internal Medicine

## 2018-04-16 VITALS — BP 134/55 | HR 49 | Temp 97.8°F | Resp 15 | Ht 73.0 in | Wt 180.0 lb

## 2018-04-16 DIAGNOSIS — D125 Benign neoplasm of sigmoid colon: Secondary | ICD-10-CM

## 2018-04-16 DIAGNOSIS — D122 Benign neoplasm of ascending colon: Secondary | ICD-10-CM

## 2018-04-16 DIAGNOSIS — D124 Benign neoplasm of descending colon: Secondary | ICD-10-CM

## 2018-04-16 DIAGNOSIS — D120 Benign neoplasm of cecum: Secondary | ICD-10-CM | POA: Diagnosis not present

## 2018-04-16 DIAGNOSIS — Z86010 Personal history of colonic polyps: Secondary | ICD-10-CM | POA: Diagnosis not present

## 2018-04-16 DIAGNOSIS — D123 Benign neoplasm of transverse colon: Secondary | ICD-10-CM

## 2018-04-16 MED ORDER — SODIUM CHLORIDE 0.9 % IV SOLN: 500.0000 mL | Freq: Once | INTRAVENOUS | Status: DC

## 2018-04-16 NOTE — Patient Instructions (Addendum)
Eleven polyps today. I will let you know pathology results and if to have another routine colonoscopy by mail and/or My Chart.  I am thinking we may just watch and wait.  I appreciate the opportunity to care for you. Gatha Mayer, MD, FACG  YOU HAD AN ENDOSCOPIC PROCEDURE TODAY AT [facility]:   Refer to the procedure report that was given to you for any specific questions about what was found during the examination.  If the procedure report does not answer your questions, please call your gastroenterologist to clarify.  If you requested that your care partner not be given the details of your procedure findings, then the procedure report has been included in a sealed envelope for you to review at your convenience later.  YOU SHOULD EXPECT: Some feelings of bloating in the abdomen. Passage of more gas than usual.  Walking can help get rid of the air that was put into your GI tract during the procedure and reduce the bloating. If you had a lower endoscopy (such as a colonoscopy or flexible sigmoidoscopy) you may notice spotting of blood in your stool or on the toilet paper. If you underwent a bowel prep for your procedure, you may not have a normal bowel movement for a few days.  Please Note:  You might notice some irritation and congestion in your nose or some drainage.  This is from the oxygen used during your procedure.  There is no need for concern and it should clear up in a day or so.  SYMPTOMS TO REPORT IMMEDIATELY:   Following lower endoscopy (colonoscopy or flexible sigmoidoscopy):  Excessive amounts of blood in the stool  Significant tenderness or worsening of abdominal pains  Swelling of the abdomen that is new, acute  Fever of 100F or higher  For urgent or emergent issues, a gastroenterologist can be reached at any hour by calling [phone].   DIET:  We do recommend a small meal at first, but then you may proceed to your regular diet.  Drink  plenty of fluids but you should avoid alcoholic beverages for 24 hours.  ACTIVITY:  You should plan to take it easy for the rest of today and you should NOT DRIVE or use heavy machinery until tomorrow (because of the sedation medicines used during the test).    FOLLOW UP: Our staff will call the number listed on your records the next business day following your procedure to check on you and address any questions or concerns that you may have regarding the information given to you following your procedure. If we do not reach you, we will leave a message.  However, if you are feeling well and you are not experiencing any problems, there is no need to return our call.  We will assume that you have returned to your regular daily activities without incident.  If any biopsies were taken you will be contacted by phone or by letter within the next 1-3 weeks.  Please call us at [phone] if you have not heard about the biopsies in 3 weeks.   Await for biopsy results Polyps (handout given) Diverticulosis (handout given) No repeat Colonoscopy   SIGNATURES/CONFIDENTIALITY: You and/or your care partner have signed paperwork which will be entered into your electronic medical record.  These signatures attest to the fact that that the information above on your After Visit Summary has been reviewed and is understood.  Full responsibility of the confidentiality of this discharge information lies  with you and/or your care-partner.

## 2018-04-16 NOTE — Progress Notes (Signed)
Pt's states no medical or surgical changes since previsit or office visit. 

## 2018-04-16 NOTE — Progress Notes (Signed)
To PACU, VSS. Report to [email or web address] 

## 2018-04-16 NOTE — Progress Notes (Signed)
Called to room to assist during endoscopic procedure.  Patient ID and intended procedure confirmed with present staff. Received instructions for my participation in the procedure from the performing physician.  

## 2018-04-16 NOTE — Op Note (Signed)
[facility] Patient Name: Glen [name] Procedure Date: 04/16/2018 3:11 PM MRN: [id number] Endoscopist: Gatha Mayer , MD Age: 82 Referring MD:  Date of Birth: 06/12/1936 Gender: Male Account #: [id number] Procedure:                Colonoscopy Indications:              Surveillance: History of numerous (> 10) adenomas                            on last colonoscopy (< 3 yrs) Medicines:                Propofol per Anesthesia, Monitored Anesthesia Care Procedure:                Pre-Anesthesia Assessment:                           - Prior to the procedure, a History and Physical                            was performed, and patient medications and                            allergies were reviewed. The patient's tolerance of                            previous anesthesia was also reviewed. The risks                            and benefits of the procedure and the sedation                            options and risks were discussed with the patient.                            All questions were answered, and informed consent                            was obtained. Prior Anticoagulants: The patient has                            taken no previous anticoagulant or antiplatelet                            agents. ASA Grade Assessment: II - A patient with                            mild systemic disease. After reviewing the risks                            and benefits, the patient was deemed in                            satisfactory condition to undergo the procedure.  After obtaining informed consent, the colonoscope                            was passed under direct vision. Throughout the                            procedure, the patient's blood pressure, pulse, and                            oxygen saturations were monitored continuously. The                            Colonoscope was introduced through the anus and                            advanced  to the the cecum, identified by                            appendiceal orifice and ileocecal valve. The                            colonoscopy was performed without difficulty. The                            patient tolerated the procedure well. The quality                            of the bowel preparation was good. The ileocecal                            valve, appendiceal orifice, and rectum were                            photographed. The bowel preparation used was                            Miralax. Scope In: 3:24:45 PM Scope Out: 3:50:59 PM Scope Withdrawal Time: 0 hours 22 minutes 35 seconds  Total Procedure Duration: 0 hours 26 minutes 14 seconds  Findings:                 The perianal and digital rectal examinations were                            normal. Pertinent negatives include normal prostate                            (size, shape, and consistency).                           Seven sessile polyps were found in the sigmoid                            colon, transverse colon and cecum. The polyps were  3 to 7 mm in size. These polyps were removed with a                            cold snare. Resection and retrieval were complete.                            Verification of patient identification for the                            specimen was done. Estimated blood loss was minimal.                           Four sessile polyps were found in the descending                            colon, transverse colon and ascending colon. The                            polyps were diminutive in size. These polyps were                            removed with a cold biopsy forceps. Resection and                            retrieval were complete. Verification of patient                            identification for the specimen was done. Estimated                            blood loss was minimal.                           Diverticula were found in the left  colon.                           The exam was otherwise without abnormality on                            direct and retroflexion views. Complications:            No immediate complications. Estimated Blood Loss:     Estimated blood loss was minimal. Impression:               - Seven 3 to 7 mm polyps in the sigmoid colon, in                            the transverse colon and in the cecum, removed with                            a cold snare. Resected and retrieved.                           - Four diminutive  polyps in the descending colon,                            in the transverse colon and in the ascending colon,                            removed with a cold biopsy forceps. Resected and                            retrieved.                           - Diverticulosis in the left colon.                           - The examination was otherwise normal on direct                            and retroflexion views.                           - Personal history of numerous adenomatous colonic                            polyps. Recommendation:           - Patient has a contact number available for                            emergencies. The signs and symptoms of potential                            delayed complications were discussed with the                            patient. Return to normal activities tomorrow.                            Written discharge instructions were provided to the                            patient.                           - Resume previous diet.                           - Continue present medications.                           - No repeat colonoscopy. Gatha Mayer, MD 04/16/2018 4:05:50 PM This report has been signed electronically.

## 2018-04-21 ENCOUNTER — Telehealth: Payer: Self-pay | Admitting: *Deleted

## 2018-04-21 NOTE — Telephone Encounter (Signed)
  Follow up Call-  Call back number 04/16/2018 03/20/2017 04/16/2016  Post procedure Call Back phone  # [phone] cell [phone]  Permission to leave phone message Yes Yes Yes  Some recent data might be hidden     Patient questions:  Do you have a fever, pain , or abdominal swelling? No. Pain Score  0 *  Have you tolerated food without any problems? Yes.    Have you been able to return to your normal activities? Yes.    Do you have any questions about your discharge instructions: Diet   No. Medications  No. Follow up visit  No.  Do you have questions or concerns about your Care? No.  Actions: * If pain score is 4 or above: No action needed, pain <4.

## 2018-04-21 NOTE — Telephone Encounter (Signed)
  Follow up Call-  Call back number 04/16/2018 03/20/2017 04/16/2016  Post procedure Call Back phone  # [phone] cell [phone]  Permission to leave phone message Yes Yes Yes  Some recent data might be hidden     Patient questions:  Left message to call us if necessary.

## 2018-04-23 ENCOUNTER — Encounter: Payer: Self-pay | Admitting: Internal Medicine

## 2018-04-23 NOTE — Progress Notes (Signed)
Multiple adenomas/ssp's again Recall OV 1 year Letter to patient by My Chart

## 2018-05-23 IMAGING — RF DG LUMBAR SPINE 2-3V
1 series · 2 of 2 positions shown · non-contrast
Comparison: Lumbar spine radiograph 04/24/2017.

CLINICAL DATA: 81-year-old male undergoing L2-S1 fusion.

EXAM:
DG C-ARM GT 120 MIN; LUMBAR SPINE - 2-3 VIEW

[Series 1: run · 2 of 2 slices shown]
[im 1/2]
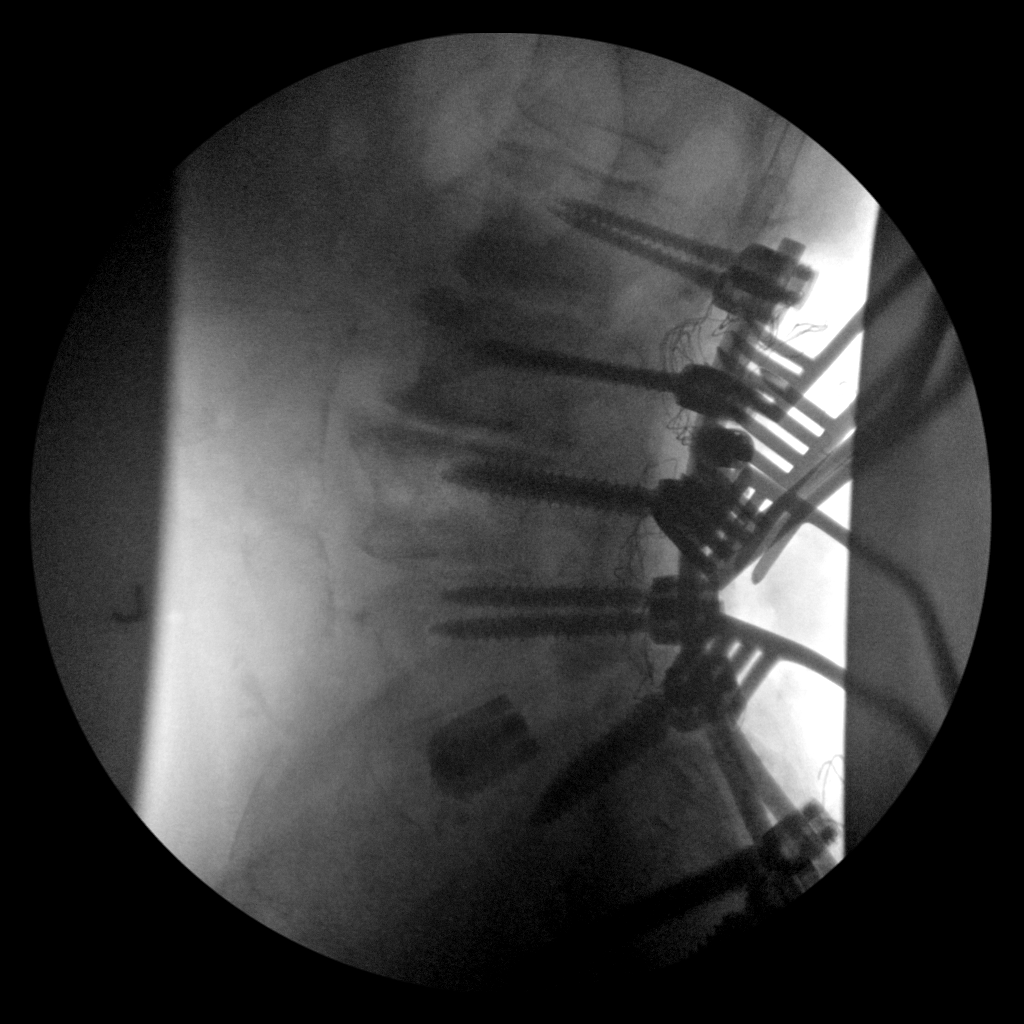
[im 2/2]
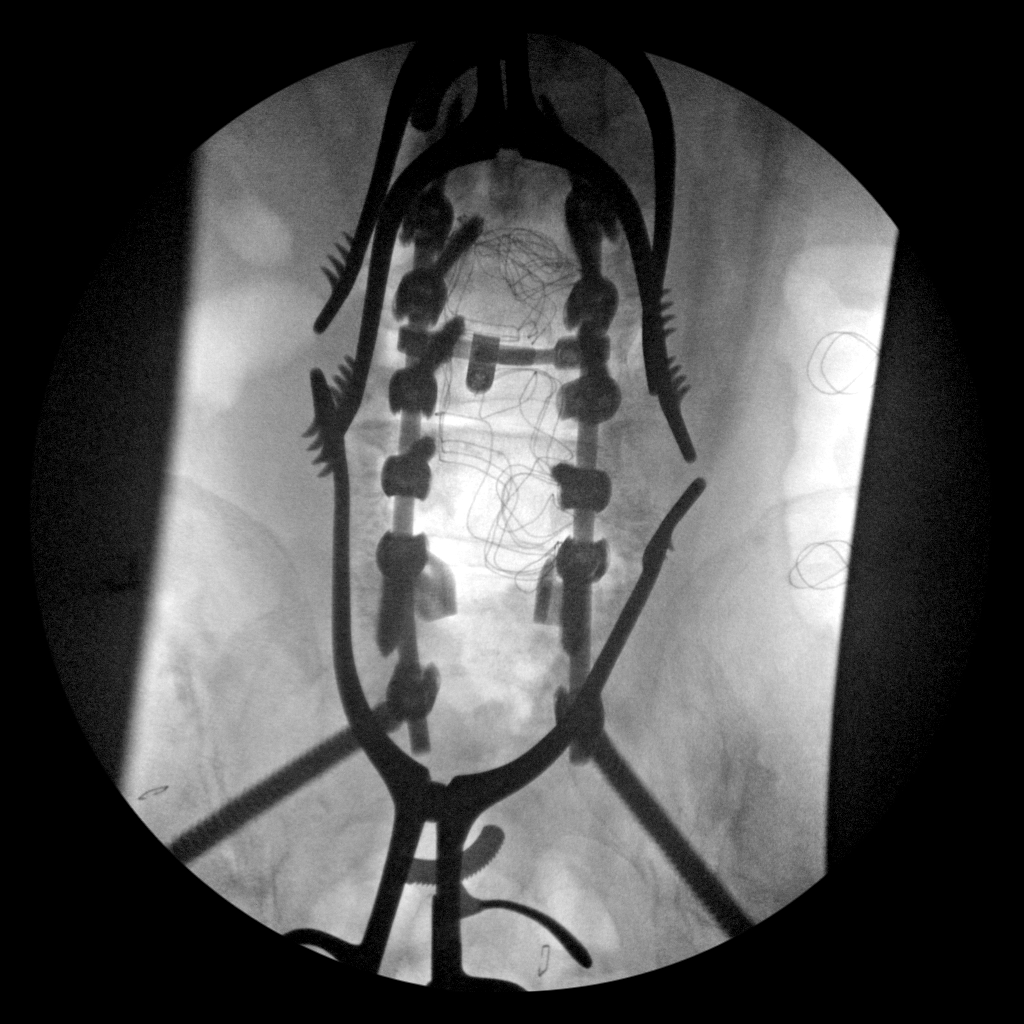

[2 of 2 positions shown; findings below may reference images not displayed]

FLUOROSCOPY TIME:  Fluoroscopy Time:  29 seconds

Number of Acquired Spot Images: 2
FINDINGS: Two limited highly coned views of the lumbar spine are submitted for
evaluation. Because of this, accurate assessment of spinal levels is
not possible. However, based on several anatomical landmarks, new
orthopedic fixation hardware appears to be in place extending from
L2-S2 with posterior rod and pedicle screw fixation devices in place
at these levels. Fixation screws at S2 appeared to be long, likely
traversing the sacroiliac joints bilaterally. Interbody cage at
L5-S1. Posteriorly, soft tissue retractors are in place.
IMPRESSION: 1. Intraoperative documentation of PLIF, which appears to extend
from L2 through S2, as above.

## 2018-08-07 ENCOUNTER — Ambulatory Visit: Payer: Medicare Other | Admitting: Internal Medicine

## 2019-04-06 ENCOUNTER — Ambulatory Visit (INDEPENDENT_AMBULATORY_CARE_PROVIDER_SITE_OTHER): Payer: Medicare Other | Admitting: Internal Medicine

## 2019-04-06 ENCOUNTER — Encounter: Payer: Self-pay | Admitting: Internal Medicine

## 2019-04-06 ENCOUNTER — Other Ambulatory Visit: Payer: Self-pay

## 2019-04-06 VITALS — BP 168/84 | HR 56 | Temp 98.0°F | Ht 70.0 in | Wt 175.2 lb

## 2019-04-06 DIAGNOSIS — D126 Benign neoplasm of colon, unspecified: Secondary | ICD-10-CM | POA: Diagnosis not present

## 2019-04-06 DIAGNOSIS — Z86010 Personal history of colonic polyps: Secondary | ICD-10-CM | POA: Diagnosis not present

## 2019-04-06 NOTE — Assessment & Plan Note (Addendum)
We will schedule colonoscopy.  He is a very fit 83 year old and has had numerous polyp so I wonder if he does not really have a polyposis syndrome.  Consider genetic testing which may impact his children.  Wait for this next colonoscopy to decide.

## 2019-04-06 NOTE — Patient Instructions (Signed)
You have been scheduled for a colonoscopy. Please follow written instructions given to you at your visit today.  Please pick up your prep supplies at the pharmacy within the next 1-3 days. If you use inhalers (even only as needed), please bring them with you on the day of your procedure.   I appreciate the opportunity to care for you. Carl Gessner, MD, FACG 

## 2019-04-06 NOTE — Progress Notes (Signed)
Rossi Burdo 83 y.o. July 20, 1936 [id number]  Assessment & Plan:   Encounter Diagnoses  Name Primary?  Marland Kitchen Hx of adenomatous colonic polyps   . Polyposis coli Yes   Hx of adenomatous colonic polyps We will schedule colonoscopy.  He is a very fit 83 year old and has had numerous polyp so I wonder if he does not really have a polyposis syndrome.  Consider genetic testing which may impact his children.  Wait for this next colonoscopy to decide.      Subjective:   Chief Complaint: History of colon polyps HPI The patient is an 83 year old white man with a history of numerous adenomatous colon polyps, 14 adenomas with a maximum 20 mm in August 2017, he had residual adenomas at large polypectomy site in 04/07/2017 and other adenomas, and he had 11 polyps 1 of which was hyperplastic and others adenomatous and SSP's in August 2019.  He remains very active he works part-time at his company, [employer], dispensing cases, and is interested in pursuing at least one more colonoscopy given this.  No active GI symptoms. Allergies  Allergen Reactions  . Gabapentin Other (See Comments)    "Mental status/awareness changes"   Current Meds  Medication Sig  . aspirin EC 81 MG tablet Take 81 mg by mouth daily.  Marland Kitchen atorvastatin (LIPITOR) 10 MG tablet Take 10 mg by mouth daily.  . Carboxymeth-Glycerin-Polysorb (REFRESH OPTIVE ADVANCED OP) Place 1 drop into both eyes as needed.   . metoprolol tartrate (LOPRESSOR) 25 MG tablet Take 0.5 tablets (12.5 mg total) by mouth 2 (two) times daily.  . Multiple Vitamin (MULTIVITAMIN) tablet Take 1 tablet by mouth daily. Senior formula  . Omega-3 Fatty Acids (FISH OIL PO) Take 1 capsule by mouth daily.   Current Facility-Administered Medications for the 04/06/19 encounter (Office Visit) with Gatha Mayer, MD  Medication  . 0.9 %  sodium chloride infusion   Past Medical History:  Diagnosis Date  . Allergy   . Arthritis   . Cataracts, bilateral    bil cataracts removed  .  DDD (degenerative disc disease), lumbosacral    pain radiates down left leg  . Environmental and seasonal allergies   . GERD (gastroesophageal reflux disease)   . Hearing loss    bilateral hearing aids  . Heart murmur    recently  saw cardiologist and said it was fine   . Hx of adenomatous colonic polyps 04/23/2016  . Hyperlipidemia   . Wound dehiscence 05/31/2017   Past Surgical History:  Procedure Laterality Date  . APPLICATION OF ROBOTIC ASSISTANCE FOR SPINAL PROCEDURE N/A 05/27/2017   Procedure: APPLICATION OF ROBOTIC ASSISTANCE FOR SPINAL PROCEDURE;  Surgeon: Ditty, Kevan Ny, MD;  Location: [facility];  Service: Neurosurgery;  Laterality: N/A;  . cataract     bilat  . CIRCUMCISION    . COLONOSCOPY    . COLONOSCOPY W/ POLYPECTOMY    . LAMINECTOMY  05/27/2017   SACRAL  . LUMBAR WOUND DEBRIDEMENT N/A 05/31/2017   Procedure: LUMBAR WOUND REVISION;  Surgeon: Eustace Moore, MD;  Location: [facility];  Service: Neurosurgery;  Laterality: N/A;  . TONSILLECTOMY    . 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   Review of Systems As per HPI  Objective:   Physical Exam BP (!) 168/84 (BP Location: Left Arm, Patient Position: Sitting, Cuff Size: Normal)   Pulse (!) 56   Temp 98 F (36.7 C)   Ht 5\' 10"  (1.778 m) Comment: height measured without shoes  Wt 175 lb 4 oz (79.5 kg)   BMI 25.15 kg/m  No acute distress Eyes anicteric Lungs clear Normal heart sounds

## 2019-04-14 ENCOUNTER — Telehealth: Payer: Self-pay | Admitting: *Deleted

## 2019-04-14 NOTE — Telephone Encounter (Signed)
Covid-19 screening questions   Do you now or have you had a fever in the last 14 days?no  Do you have any respiratory symptoms of shortness of breath or cough now or in the last 14 days?no  Do you have any family members or close contacts with diagnosed or suspected Covid-19 in the past 14 days?no  Have you been tested for Covid-19 and found to be positive?no  Pts wife made aware of care partner policy.

## 2019-04-15 ENCOUNTER — Encounter: Payer: Self-pay | Admitting: Internal Medicine

## 2019-04-15 ENCOUNTER — Ambulatory Visit (AMBULATORY_SURGERY_CENTER): Payer: Medicare Other | Admitting: Internal Medicine

## 2019-04-15 ENCOUNTER — Other Ambulatory Visit: Payer: Self-pay

## 2019-04-15 VITALS — BP 126/54 | HR 56 | Temp 98.7°F | Resp 16 | Ht 70.0 in | Wt 175.0 lb

## 2019-04-15 DIAGNOSIS — D126 Benign neoplasm of colon, unspecified: Secondary | ICD-10-CM

## 2019-04-15 DIAGNOSIS — Z86010 Personal history of colonic polyps: Secondary | ICD-10-CM

## 2019-04-15 DIAGNOSIS — D123 Benign neoplasm of transverse colon: Secondary | ICD-10-CM

## 2019-04-15 MED ORDER — SODIUM CHLORIDE 0.9 % IV SOLN: 500.0000 mL | Freq: Once | INTRAVENOUS | Status: DC

## 2019-04-15 NOTE — Op Note (Signed)
[facility] Patient Name: Glen [name] Procedure Date: 04/15/2019 2:20 PM MRN: [id number] Endoscopist: Gatha Mayer , MD Age: 83 Referring MD:  Date of Birth: Jul 03, 1936 Gender: Male Account #: [id number] Procedure:                Colonoscopy Indications:              Surveillance: History of numerous (> 10) adenomas                            on last colonoscopy (< 3 yrs) Medicines:                Propofol per Anesthesia, Monitored Anesthesia Care Procedure:                Pre-Anesthesia Assessment:                           - Prior to the procedure, a History and Physical                            was performed, and patient medications and                            allergies were reviewed. The patient's tolerance of                            previous anesthesia was also reviewed. The risks                            and benefits of the procedure and the sedation                            options and risks were discussed with the patient.                            All questions were answered, and informed consent                            was obtained. Prior Anticoagulants: The patient has                            taken no previous anticoagulant or antiplatelet                            agents. ASA Grade Assessment: III - A patient with                            severe systemic disease. After reviewing the risks                            and benefits, the patient was deemed in                            satisfactory condition to undergo the procedure.  After obtaining informed consent, the colonoscope                            was passed under direct vision. Throughout the                            procedure, the patient's blood pressure, pulse, and                            oxygen saturations were monitored continuously. The                            Colonoscope was introduced through the anus and   advanced to the the cecum, identified by                            appendiceal orifice and ileocecal valve. The                            colonoscopy was performed with difficulty due to a                            redundant colon and significant looping. Successful                            completion of the procedure was aided by applying                            abdominal pressure. The patient tolerated the                            procedure well. The quality of the bowel                            preparation was good. The bowel preparation used                            was Miralax via split dose instruction. Scope In: 2:24:11 PM Scope Out: 2:52:08 PM Scope Withdrawal Time: 0 hours 12 minutes 36 seconds  Total Procedure Duration: 0 hours 27 minutes 57 seconds  Findings:                 The perianal and digital rectal examinations were                            normal. Pertinent negatives include normal prostate                            (size, shape, and consistency).                           Two sessile polyps were found in the transverse                            colon. The polyps  were diminutive in size. These                            polyps were removed with a cold snare. Resection                            and retrieval were complete. Verification of                            patient identification for the specimen was done.                            Estimated blood loss was minimal.                           Multiple diverticula were found in the sigmoid                            colon, descending colon and transverse colon.                           The colon (entire examined portion) was                            significantly redundant. Advancing the scope                            required changing the patient to a supine position                            and using manual pressure.                           The exam was otherwise without abnormality  on                            direct and retroflexion views. Complications:            No immediate complications. Estimated Blood Loss:     Estimated blood loss was minimal. Impression:               - Two diminutive polyps in the transverse colon,                            removed with a cold snare. Resected and retrieved.                           - Diverticulosis in the sigmoid colon, in the                            descending colon and in the transverse colon.                           - Redundant colon.                           -  The examination was otherwise normal on direct                            and retroflexion views.                           - Personal history of colonic polyps. numerous                            adenomas and some ssp's since 2017 Recommendation:           - Patient has a contact number available for                            emergencies. The signs and symptoms of potential                            delayed complications were discussed with the                            patient. Return to normal activities tomorrow.                            Written discharge instructions were provided to the                            patient.                           - Resume previous diet.                           - Continue present medications.                           - No repeat colonoscopy due to age. Gatha Mayer, MD 04/15/2019 3:04:57 PM This report has been signed electronically.

## 2019-04-15 NOTE — Progress Notes (Signed)
Pt's states no medical or surgical changes since previsit or office visit.  June Temp Nancy vitals

## 2019-04-15 NOTE — Patient Instructions (Addendum)
Only 2 little polyps this time. No more routine colonoscopy for you.  I will let you know results but feel certain the polyps are benign.  You also have a condition called diverticulosis - common and not usually a problem. Please read the handout provided.  I appreciate the opportunity to care for you. Gatha Mayer, MD, [facility]   HANDOUTS GIVEN : POLYPS AND DIVERTICULOSIS  YOU HAD AN ENDOSCOPIC PROCEDURE TODAY AT [facility]:   Refer to the procedure report that was given to you for any specific questions about what was found during the examination.  If the procedure report does not answer your questions, please call your gastroenterologist to clarify.  If you requested that your care partner not be given the details of your procedure findings, then the procedure report has been included in a sealed envelope for you to review at your convenience later.  YOU SHOULD EXPECT: Some feelings of bloating in the abdomen. Passage of more gas than usual.  Walking can help get rid of the air that was put into your GI tract during the procedure and reduce the bloating. If you had a lower endoscopy (such as a colonoscopy or flexible sigmoidoscopy) you may notice spotting of blood in your stool or on the toilet paper. If you underwent a bowel prep for your procedure, you may not have a normal bowel movement for a few days.  Please Note:  You might notice some irritation and congestion in your nose or some drainage.  This is from the oxygen used during your procedure.  There is no need for concern and it should clear up in a day or so.  SYMPTOMS TO REPORT IMMEDIATELY:   Following lower endoscopy (colonoscopy or flexible sigmoidoscopy):  Excessive amounts of blood in the stool  Significant tenderness or worsening of abdominal pains  Swelling of the abdomen that is new, acute  Fever of 100F or higher   For urgent or emergent issues, a gastroenterologist can be reached at any hour by  calling [phone].   DIET:  We do recommend a small meal at first, but then you may proceed to your regular diet.  Drink plenty of fluids but you should avoid alcoholic beverages for 24 hours.  ACTIVITY:  You should plan to take it easy for the rest of today and you should NOT DRIVE or use heavy machinery until tomorrow (because of the sedation medicines used during the test).    FOLLOW UP: Our staff will call the number listed on your records 48-72 hours following your procedure to check on you and address any questions or concerns that you may have regarding the information given to you following your procedure. If we do not reach you, we will leave a message.  We will attempt to reach you two times.  During this call, we will ask if you have developed any symptoms of COVID 19. If you develop any symptoms (ie: fever, flu-like symptoms, shortness of breath, cough etc.) before then, please call [phone].  If you test positive for Covid 19 in the 2 weeks post procedure, please call and report this information to [place].    If any biopsies were taken you will be contacted by phone or by letter within the next 1-3 weeks.  Please call us at [phone] if you have not heard about the biopsies in 3 weeks.    SIGNATURES/CONFIDENTIALITY: You and/or your care partner have signed paperwork which will be entered into your  electronic medical record.  These signatures attest to the fact that that the information above on your After Visit Summary has been reviewed and is understood.  Full responsibility of the confidentiality of this discharge information lies with you and/or your care-partner.

## 2019-04-15 NOTE — Progress Notes (Signed)
Report given to PACU, vss 

## 2019-04-19 ENCOUNTER — Telehealth: Payer: Self-pay | Admitting: *Deleted

## 2019-04-19 NOTE — Telephone Encounter (Signed)
  Follow up Call-  Call back number 04/15/2019 04/16/2018 03/20/2017  Post procedure Call Back phone  # [phone] cell  Permission to leave phone message Yes Yes Yes  Some recent data might be hidden     Patient questions:  Message left to call us if necessary.

## 2019-04-19 NOTE — Telephone Encounter (Signed)
1. Have you developed a fever since your procedure?no  2.   Have you had an respiratory symptoms (SOB or cough) since your procedure? no  3.   Have you tested positive for COVID 19 since your procedure no  4.   Have you had any family members/close contacts diagnosed with the COVID 19 since your procedure?  no   If yes to any of these questions please route to Joylene John, RN and Alphonsa Gin, Therapist, sports.     Follow up Call-  Call back number 04/15/2019 04/16/2018 03/20/2017  Post procedure Call Back phone  # [phone] cell  Permission to leave phone message Yes Yes Yes  Some recent data might be hidden     Patient questions:  Do you have a fever, pain , or abdominal swelling? No. Pain Score  0 *  Have you tolerated food without any problems? Yes   Have you been able to return to your normal activities? Yes.    Do you have any questions about your discharge instructions: Diet   No. Medications  No. Follow up visit  No.  Do you have questions or concerns about your Care? No.  Actions: * If pain score is 4 or above: No action needed, pain <4.

## 2019-04-23 ENCOUNTER — Encounter: Payer: Self-pay | Admitting: Internal Medicine

## 2021-09-03 ENCOUNTER — Telehealth: Payer: Self-pay | Admitting: Neurology

## 2021-09-03 ENCOUNTER — Encounter: Payer: Self-pay | Admitting: Neurology

## 2021-09-03 ENCOUNTER — Ambulatory Visit: Payer: Medicare PPO | Admitting: Neurology

## 2021-09-03 VITALS — BP 166/92 | HR 68 | Ht 70.0 in | Wt 166.0 lb

## 2021-09-03 DIAGNOSIS — R200 Anesthesia of skin: Secondary | ICD-10-CM

## 2021-09-03 DIAGNOSIS — R29898 Other symptoms and signs involving the musculoskeletal system: Secondary | ICD-10-CM

## 2021-09-03 DIAGNOSIS — R3915 Urgency of urination: Secondary | ICD-10-CM | POA: Diagnosis not present

## 2021-09-03 DIAGNOSIS — R269 Unspecified abnormalities of gait and mobility: Secondary | ICD-10-CM

## 2021-09-03 MED ORDER — ALPRAZOLAM 0.5 MG PO TABS: 0.5000 mg | ORAL_TABLET | Freq: Every evening | ORAL | 0 refills | Status: DC | PRN

## 2021-09-03 MED ORDER — TOLTERODINE TARTRATE ER 2 MG PO CP24: 2.0000 mg | ORAL_CAPSULE | Freq: Every day | ORAL | 5 refills | Status: DC

## 2021-09-03 NOTE — Progress Notes (Signed)
[facility]  PATIENT: Glen [name] DOB: 03/09/1936  REFERRING DOCTOR OR PCP:  Kennieth Rad, MD SOURCE: Patient, notes from primary care, imaging reports, laboratory reports, CT scan images personally reviewed.  _________________________________   HISTORICAL  CHIEF COMPLAINT:  Chief Complaint  Patient presents with   New Patient (Initial Visit)    Rm 1, alone. Paper referral from Kennieth Rad, MD for muscle weakness.     HISTORY OF PRESENT ILLNESS:  I had the pleasure of seeing your patient, Glen [name], at [facility] for neurologic consultation regarding his weakness.  He is an 86 year old man who has noted weakness about 3-4 weeks ago.   Along with the weakness, he also notes gai is slower.   He was waling 3 miles daily.   Over the last few months, he would get a sensation in both legs that would occur with standing or walking but not if he is sitting.   He went from walking well to a cane to a walker over the last few weeks.    The right leg is weaker than his left.  He has numbness on both feet  He had noted mild toe numbness x several years but the leg dysesthesia is new  He is on atorvastatin x 5 years for elevated choletsterol.        He denies any numbnes, pain or weakness in his arms.   He is needing to urina more frequently and he has had a little incontinence over the last month  He has a h/o spinal issues.   He had right sciatica type pain.   Prompting surgery in 2018.  He had PLIF L2-S2 in 2018 CT 05/08/2017 showed mild spinal stenosis at L2L3, L3L4, L4L5 and L5S1 but significant foraminal narrowing at ost of these levels.   He had grade 1 anterolisthesis at L5S1.   In T spine he had mild spinal stenosis associated with linear calcifications at T8T9.     Lumbar xrays 05/27/2017 show PLIF from L2-S2.    He does not recall having any imaging of the cervical spine or brain..   Labs 05/30/2021:  TSH, T4 hemoglobin, white blood cell  were normal. Cholesterol was 172 and HDL was 101 Creatinine was elevated at 1.34.  Hemoglobin A1c borderline at 5 9, glucose 109  B12 3554 (193-986); folate elevated at 30.8 (3.1-17.5)   REVIEW OF SYSTEMS: Constitutional: No fevers, chills, sweats, or change in appetite Eyes: No visual changes, double vision, eye pain Ear, nose and throat: No hearing loss, ear pain, nasal congestion, sore throat Cardiovascular: No chest pain, palpitations Respiratory:  No shortness of breath at rest or with exertion.   No wheezes GastrointestinaI: No nausea, vomiting, diarrhea, abdominal pain, fecal incontinence Genitourinary:  No dysuria, urinary retention or frequency.  No nocturia. Musculoskeletal:  No neck pain, back pain Integumentary: No rash, pruritus, skin lesions Neurological: as above Psychiatric: No depression at this time.  No anxiety Endocrine: No palpitations, diaphoresis, change in appetite, change in weigh or increased thirst Hematologic/Lymphatic:  No anemia, purpura, petechiae. Allergic/Immunologic: No itchy/runny eyes, nasal congestion, recent allergic reactions, rashes  ALLERGIES: Allergies  Allergen Reactions   Gabapentin Other (See Comments)    "Mental status/awareness changes"    HOME MEDICATIONS:  Current Outpatient Medications:    aspirin EC 81 MG tablet, Take 81 mg by mouth daily., Disp: , Rfl:    atorvastatin (LIPITOR) 10 MG tablet, Take 10 mg by mouth daily., Disp: , Rfl:    Carboxymeth-Glycerin-Polysorb (  REFRESH OPTIVE ADVANCED OP), Place 1 drop into both eyes as needed. , Disp: , Rfl:    metoprolol tartrate (LOPRESSOR) 25 MG tablet, Take 0.5 tablets (12.5 mg total) by mouth 2 (two) times daily., Disp: 60 tablet, Rfl: 2   Multiple Vitamin (MULTIVITAMIN) tablet, Take 1 tablet by mouth daily. Senior formula, Disp: , Rfl:    Omega-3 Fatty Acids (FISH OIL PO), Take 1 capsule by mouth daily., Disp: , Rfl:   PAST MEDICAL HISTORY: Past Medical History:  Diagnosis Date    Allergy    Arthritis    Cataracts, bilateral    bil cataracts removed   DDD (degenerative disc disease), lumbosacral    pain radiates down left leg   Environmental and seasonal allergies    GERD (gastroesophageal reflux disease)    Hearing loss    bilateral hearing aids   Heart murmur    recently  saw cardiologist and said it was fine    Hx of adenomatous colonic polyps 04/23/2016   Hyperlipidemia    Wound dehiscence 05/31/2017    PAST SURGICAL HISTORY: Past Surgical History:  Procedure Laterality Date   APPLICATION OF ROBOTIC ASSISTANCE FOR SPINAL PROCEDURE N/A 05/27/2017   Procedure: APPLICATION OF ROBOTIC ASSISTANCE FOR SPINAL PROCEDURE;  Surgeon: Ditty, Kevan Ny, MD;  Location: [place];  Service: Neurosurgery;  Laterality: N/A;   cataract     bilat   CIRCUMCISION     COLONOSCOPY     COLONOSCOPY W/ POLYPECTOMY     LAMINECTOMY  05/27/2017   SACRAL   LUMBAR WOUND DEBRIDEMENT N/A 05/31/2017   Procedure: LUMBAR WOUND REVISION;  Surgeon: Eustace Moore, MD;  Location: [place];  Service: Neurosurgery;  Laterality: N/A;   TONSILLECTOMY     wisdom teth extraction      FAMILY HISTORY: Family History  Problem Relation Age of Onset   Alzheimer's disease Father    Stroke Brother    Colon cancer Neg Hx    Esophageal cancer Neg Hx    Rectal cancer Neg Hx    Stomach cancer Neg Hx    Pancreatic cancer Neg Hx    Prostate cancer Neg Hx     SOCIAL HISTORY:  Social History   Socioeconomic History   Marital status: Married    Spouse name: Not on file   Number of children: 2   Years of education: Not on file   Highest education level: Not on file  Occupational History   Not on file  Tobacco Use   Smoking status: Former    Packs/day: 1.00    Types: Cigarettes    Quit date: 08/02/2016    Years since quitting: 5.0   Smokeless tobacco: Never  Vaping Use   Vaping Use: Never used  Substance and Sexual Activity   Alcohol use: Yes    Alcohol/week: 7.0 standard drinks     Types: 7 Shots of liquor per week   Drug use: No   Sexual activity: Not on file  Other Topics Concern   Not on file  Social History Narrative   Married (wife dx Parkinson's 2019-20)   1 daughter   1 son   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  There is no height or weight on file to calculate BMI.   General: The patient is well-developed and well-nourished and in no acute distress  HEENT:  Head is [place]/AT.  Sclera are anicteric.     Neck: No carotid bruits are noted.  The neck is nontender with mildly reduced range of motion.  Cardiovascular: The heart has a regular rate and rhythm with a normal S1 and S2. There were no murmurs, gallops or rubs.    Skin: Extremities are without rash or  edema.  Musculoskeletal:  Back is nontender  Neurologic Exam  Mental status: The patient is alert and oriented x 3 at the time of the examination. The patient has apparent normal recent and remote memory, with an apparently normal attention span and concentration ability.   Speech is normal.  Cranial nerves: Extraocular movements are full. Pupils are equal, round, and reactive to light and accomodation.  Visual fields are full.  Facial symmetry is present. There is good facial sensation to soft touch bilaterally.Facial strength is normal.  Trapezius and sternocleidomastoid strength is normal. No dysarthria is noted.  Mildly reduced hearing.  Motor:  Muscle bulk is normal.   Tone is normal. Strength is  5 / 5 in arms, 4+/5 left EHL and 4/5 right tib ant and 4-/5  right EHL.   4+/5 bilateral iliopsoas, 5 -/5 right gastrocnemius, 5/5 quads  Sensory:  He has normal sensation in arms.   He has normal vibration sensation in left knee but 60% right knee and 10% ankles and toes bilaterally.    He has mildly reduced pinprick sensation in feet and moderate reduced near knee.  Coordination: Cerebellar testing reveals good finger-nose-finger and heel-to-shin bilaterally.  Gait and station: Station is normal.   Gait is normal. Tandem gait is normal. Romberg is negative.   Reflexes: Deep tendon reflexes are symmetric and normal in arms, 3 at knees and 3 right and  2 left ankle.   Plantar responses are flexor.       ASSESSMENT AND PLAN  Gait disturbance - Plan: MR BRAIN WO CONTRAST, MR CERVICAL SPINE WO CONTRAST, MR THORACIC SPINE WO CONTRAST  Urinary urgency - Plan: MR BRAIN WO CONTRAST, MR CERVICAL SPINE WO CONTRAST, MR THORACIC SPINE WO CONTRAST  Numbness - Plan: MR BRAIN WO CONTRAST, MR CERVICAL SPINE WO CONTRAST, MR THORACIC SPINE WO CONTRAST, Multiple Myeloma Panel (SPEP&IFE w/QIG)  Weakness of both lower extremities - Plan: MR BRAIN WO CONTRAST, MR CERVICAL SPINE WO CONTRAST, MR THORACIC SPINE WO CONTRAST, CK, Acetylcholine receptor, binding   In summary, Glen [name] is an 86 year old man who has had progressive gait disturbance, urinary dysfunction, leg weakness and numbness over the last 4 weeks.    Symptoms are bilateral though worse on the right.  I am most concerned about the possibility of spinal stenosis in the neck or thoracic spine causing myelopathy.  Other medical issues that might cause issues with gait, bladder and weakness could be normal pressure hydrocephalus,.  He does have quite a bit more numbness to vibration in the feet than he does at the knees and there could be a polyneuropathy.  We will check MRI of the cervical and thoracic spine and brain to rule out processes causing myelopathy and normal pressure hydrocephalus, stroke or other  pathology.  Additionally I will  check SPEP/IEF, creatinine kinase and acetylcholine receptor antibodies.  Based on the results, he may need referral to neurosurgery.  He does report that he has a follow-up neurosurgical evaluation (Dr. Ronnald Ramp) at the end of the month.   Because of claustrophobia, I did give him a prescription for alprazolam and asked for the MRIs to be performed in the large bore magnet and his wife will drive the day of the MRIs.  He would do a trial of Detrol LA to see if that helps the bladder dysfunction.  He will return to see me in 6 weeks or sooner based on the results of the MRI and other tests.  He should call sooner if any new or worsening neurologic issues  Thank you for asking to see Glen [name].  Please let me know that I can be of further assistance with him or other patients in the future.  Glen [name] A. Felecia Shelling, MD, [facility] 5/90/1724, 19:54 AM Certified in Neurology, Clinical Neurophysiology, Sleep Medicine and Neuroimaging  [facility] [address] [place], [place] [address] [phone]

## 2021-09-03 NOTE — Telephone Encounter (Signed)
Please offer 10/15/21 at 1:30p w/ Dr. Felecia Shelling

## 2021-09-03 NOTE — Telephone Encounter (Signed)
Per check out note- pt needs f/u with Dr. Felecia Shelling in 6 weeks. There is currently nothing open until July- please advise.

## 2021-09-04 ENCOUNTER — Telehealth: Payer: Self-pay | Admitting: Neurology

## 2021-09-04 NOTE — Telephone Encounter (Signed)
Glen [name]: [id number] (exp. 09/04/21 to 10/04/21) order sent to GI, they will reach out to the patient to schedule.

## 2021-09-06 ENCOUNTER — Telehealth: Payer: Self-pay | Admitting: Neurology

## 2021-09-06 NOTE — Telephone Encounter (Signed)
Called pt back. Advised Dr. Felecia Shelling will want to see what MRI's he ordered show first to determine best next steps. He has these scheduled for 09/15/21. He has neurosurgery appt w/ Dr. Ronnald Ramp on 09/13/21. Aware we will call with MRI results once available. He reported increased swelling in feet. Recommended he f/u with PCP on this. He verbalized understanding.

## 2021-09-06 NOTE — Telephone Encounter (Signed)
Pt would like to know if there are any stretches or exercises he can do.  Please call

## 2021-09-08 LAB — MULTIPLE MYELOMA PANEL, SERUM
Albumin SerPl Elph-Mcnc: 4 g/dL (ref 2.9–4.4)
Albumin/Glob SerPl: 1.7 (ref 0.7–1.7)
Alpha 1: 0.2 g/dL (ref 0.0–0.4)
Alpha2 Glob SerPl Elph-Mcnc: 0.7 g/dL (ref 0.4–1.0)
B-Globulin SerPl Elph-Mcnc: 0.8 g/dL (ref 0.7–1.3)
Gamma Glob SerPl Elph-Mcnc: 0.8 g/dL (ref 0.4–1.8)
Globulin, Total: 2.5 g/dL (ref 2.2–3.9)
IgA/Immunoglobulin A, Serum: 105 mg/dL (ref 61–437)
IgG (Immunoglobin G), Serum: 710 mg/dL (ref 603–1613)
IgM (Immunoglobulin M), Srm: 59 mg/dL (ref 15–143)
Total Protein: 6.5 g/dL (ref 6.0–8.5)

## 2021-09-08 LAB — CK: Total CK: 144 U/L (ref 30–208)

## 2021-09-08 LAB — ACETYLCHOLINE RECEPTOR, BINDING: AChR Binding Ab, Serum: <0.03 nmol/L (ref 0.00–0.24)

## 2021-09-13 ENCOUNTER — Other Ambulatory Visit: Payer: Self-pay | Admitting: Neurological Surgery

## 2021-09-13 DIAGNOSIS — R29898 Other symptoms and signs involving the musculoskeletal system: Secondary | ICD-10-CM

## 2021-09-15 ENCOUNTER — Ambulatory Visit
Admission: RE | Admit: 2021-09-15 | Discharge: 2021-09-15 | Disposition: A | Discharge status: Home / Self Care | Payer: Medicare PPO | Source: Ambulatory Visit | Attending: Neurology | Admitting: Neurology

## 2021-09-15 ENCOUNTER — Other Ambulatory Visit: Payer: Self-pay

## 2021-09-15 DIAGNOSIS — R29898 Other symptoms and signs involving the musculoskeletal system: Secondary | ICD-10-CM

## 2021-09-15 DIAGNOSIS — R200 Anesthesia of skin: Secondary | ICD-10-CM

## 2021-09-15 DIAGNOSIS — R3915 Urgency of urination: Secondary | ICD-10-CM

## 2021-09-15 DIAGNOSIS — R269 Unspecified abnormalities of gait and mobility: Secondary | ICD-10-CM

## 2021-09-17 ENCOUNTER — Telehealth: Payer: Self-pay | Admitting: Neurology

## 2021-09-17 DIAGNOSIS — M5124 Other intervertebral disc displacement, thoracic region: Secondary | ICD-10-CM

## 2021-09-17 DIAGNOSIS — M4714 Other spondylosis with myelopathy, thoracic region: Secondary | ICD-10-CM

## 2021-09-17 NOTE — Telephone Encounter (Signed)
I spoke to Glen [name] about the MRI results.  I am most concerned about the degenerative changes at T11-T12 causing myelopathy and advised him to see neurosurgery.  He let me know that he already has an appointment to see Dr. Ronnald Ramp next week.

## 2021-09-17 NOTE — Telephone Encounter (Signed)
-----  Message from Britt Bottom, MD sent at 09/17/2021  1:15 PM EST ----- I spoke with Mr. Dorrance to know the results of the imaging studies.  I neglected to tell him I wanted him to get 2 blood tests to make sure that the changes at the bottom of the thoracic spine are not due to infection or inflammation.  I placed an order for ESR and CRP to be done at [facility] (he lives in [place]).  Could you please let him know and help set this up.

## 2021-09-17 NOTE — Telephone Encounter (Signed)
Called the pt to advise that Dr Felecia Shelling would like for him to have some lab work completed. Advised he would recommend having this taken care of in [place]. I  advised the pt the phone number to the [facility] located [place] in [place]. I have faxed the order to that [facility] and instructed the pt to call them to schedule a time to have his lab drawn. Pt verbalized understanding.

## 2021-09-19 ENCOUNTER — Telehealth: Payer: Self-pay | Admitting: Neurology

## 2021-09-19 NOTE — Telephone Encounter (Signed)
Pt is asking for a call to discuss a concern re: bowel movements and urinating.  Please call

## 2021-09-19 NOTE — Telephone Encounter (Signed)
Called and spoke w/ pt. He was calling about making lab appt but he got this resolved.   He is also concerned about ongoing urine/bowel issues/losing feeling below waist. Detrol helping w/ urinary issues. However, he has had two episodes where he has urge to have BM and heads to bathroom. 1st time, barely made it to the bathroom. Second time 2 days later, had bowel incontinence. This is new for him. Confirmed he has appt w/ Dr. Ronnald Ramp (neurosurgery) on 09/27/21. He will proceed to ER if sx become more frequent/severe prior to appt w/ neurosurgery. Aware I will send to Dr. Felecia Shelling to review in case he wants to add anything further prior to appt w/ neurosurgery.

## 2021-09-20 ENCOUNTER — Other Ambulatory Visit: Payer: Self-pay | Admitting: Neurology

## 2021-09-21 ENCOUNTER — Ambulatory Visit
Admission: RE | Admit: 2021-09-21 | Discharge: 2021-09-21 | Disposition: A | Discharge status: Home / Self Care | Payer: Medicare PPO | Source: Ambulatory Visit | Attending: Neurological Surgery | Admitting: Neurological Surgery

## 2021-09-21 DIAGNOSIS — R29898 Other symptoms and signs involving the musculoskeletal system: Secondary | ICD-10-CM

## 2021-09-21 LAB — SEDIMENTATION RATE: Sed Rate: 2 mm/hr (ref 0–30)

## 2021-09-21 LAB — C-REACTIVE PROTEIN: CRP: <1 mg/L (ref 0–10)

## 2021-09-27 ENCOUNTER — Other Ambulatory Visit (HOSPITAL_COMMUNITY): Payer: Self-pay | Admitting: Neurological Surgery

## 2021-09-27 DIAGNOSIS — R29898 Other symptoms and signs involving the musculoskeletal system: Secondary | ICD-10-CM

## 2021-09-28 ENCOUNTER — Other Ambulatory Visit: Payer: Self-pay

## 2021-09-28 ENCOUNTER — Ambulatory Visit (HOSPITAL_COMMUNITY)
Admission: RE | Admit: 2021-09-28 | Discharge: 2021-09-28 | Disposition: A | Discharge status: Home / Self Care | Payer: Medicare PPO | Source: Ambulatory Visit | Attending: Neurological Surgery | Admitting: Neurological Surgery

## 2021-09-28 DIAGNOSIS — R29898 Other symptoms and signs involving the musculoskeletal system: Secondary | ICD-10-CM | POA: Diagnosis not present

## 2021-09-29 ENCOUNTER — Other Ambulatory Visit: Payer: Self-pay | Admitting: Neurological Surgery

## 2021-09-30 ENCOUNTER — Other Ambulatory Visit: Payer: Self-pay

## 2021-09-30 ENCOUNTER — Encounter (HOSPITAL_COMMUNITY): Payer: Self-pay | Admitting: Neurological Surgery

## 2021-09-30 NOTE — Progress Notes (Signed)
PCP: Dr. Edwinna Areola @ [facility] in [place] Cardiologist: Dr. Sabra Heck  Pt. Sated Dr. Ronnald Ramp told him to arrive at 1000.  Pre-op instructions given to pt. NPO at [place] except meds with sips of water.

## 2021-10-01 ENCOUNTER — Ambulatory Visit (HOSPITAL_BASED_OUTPATIENT_CLINIC_OR_DEPARTMENT_OTHER): Payer: Medicare PPO | Admitting: Anesthesiology

## 2021-10-01 ENCOUNTER — Other Ambulatory Visit: Payer: Self-pay | Admitting: Neurological Surgery

## 2021-10-01 ENCOUNTER — Inpatient Hospital Stay (HOSPITAL_COMMUNITY)
Admission: RE | Admit: 2021-10-01 | Discharge: 2021-10-02 | DRG: 460 | Disposition: A | Discharge status: Home Health | Payer: Medicare PPO | Attending: Neurological Surgery | Admitting: Neurological Surgery

## 2021-10-01 ENCOUNTER — Encounter (HOSPITAL_COMMUNITY)
Admission: RE | Disposition: A | Discharge status: Home Health | Payer: Self-pay | Source: Home / Self Care | Attending: Neurological Surgery

## 2021-10-01 ENCOUNTER — Ambulatory Visit (HOSPITAL_COMMUNITY): Payer: Medicare PPO | Admitting: Anesthesiology

## 2021-10-01 ENCOUNTER — Other Ambulatory Visit: Payer: Self-pay

## 2021-10-01 ENCOUNTER — Ambulatory Visit (HOSPITAL_COMMUNITY): Payer: Medicare PPO

## 2021-10-01 ENCOUNTER — Encounter (HOSPITAL_COMMUNITY): Payer: Self-pay | Admitting: Neurological Surgery

## 2021-10-01 DIAGNOSIS — S23162A Subluxation of T11/T12 thoracic vertebra, initial encounter: Secondary | ICD-10-CM

## 2021-10-01 DIAGNOSIS — I4891 Unspecified atrial fibrillation: Secondary | ICD-10-CM | POA: Diagnosis present

## 2021-10-01 DIAGNOSIS — E785 Hyperlipidemia, unspecified: Secondary | ICD-10-CM | POA: Diagnosis present

## 2021-10-01 DIAGNOSIS — Z820 Family history of epilepsy and other diseases of the nervous system: Secondary | ICD-10-CM

## 2021-10-01 DIAGNOSIS — G959 Disease of spinal cord, unspecified: Secondary | ICD-10-CM | POA: Diagnosis not present

## 2021-10-01 DIAGNOSIS — Z823 Family history of stroke: Secondary | ICD-10-CM

## 2021-10-01 DIAGNOSIS — Z86010 Personal history of colonic polyps: Secondary | ICD-10-CM

## 2021-10-01 DIAGNOSIS — H9193 Unspecified hearing loss, bilateral: Secondary | ICD-10-CM | POA: Diagnosis present

## 2021-10-01 DIAGNOSIS — Z87891 Personal history of nicotine dependence: Secondary | ICD-10-CM

## 2021-10-01 DIAGNOSIS — Z9842 Cataract extraction status, left eye: Secondary | ICD-10-CM

## 2021-10-01 DIAGNOSIS — Z419 Encounter for procedure for purposes other than remedying health state, unspecified: Secondary | ICD-10-CM

## 2021-10-01 DIAGNOSIS — R2681 Unsteadiness on feet: Secondary | ICD-10-CM | POA: Diagnosis present

## 2021-10-01 DIAGNOSIS — M1990 Unspecified osteoarthritis, unspecified site: Secondary | ICD-10-CM | POA: Diagnosis present

## 2021-10-01 DIAGNOSIS — Z981 Arthrodesis status: Secondary | ICD-10-CM

## 2021-10-01 DIAGNOSIS — Z974 Presence of external hearing-aid: Secondary | ICD-10-CM

## 2021-10-01 DIAGNOSIS — Z9841 Cataract extraction status, right eye: Secondary | ICD-10-CM

## 2021-10-01 DIAGNOSIS — M4804 Spinal stenosis, thoracic region: Secondary | ICD-10-CM | POA: Diagnosis present

## 2021-10-01 DIAGNOSIS — K219 Gastro-esophageal reflux disease without esophagitis: Secondary | ICD-10-CM | POA: Diagnosis present

## 2021-10-01 DIAGNOSIS — Z20822 Contact with and (suspected) exposure to covid-19: Secondary | ICD-10-CM | POA: Diagnosis present

## 2021-10-01 DIAGNOSIS — R292 Abnormal reflex: Secondary | ICD-10-CM | POA: Diagnosis present

## 2021-10-01 DIAGNOSIS — M4714 Other spondylosis with myelopathy, thoracic region: Principal | ICD-10-CM | POA: Diagnosis present

## 2021-10-01 DIAGNOSIS — Z7982 Long term (current) use of aspirin: Secondary | ICD-10-CM

## 2021-10-01 DIAGNOSIS — F419 Anxiety disorder, unspecified: Secondary | ICD-10-CM | POA: Diagnosis present

## 2021-10-01 DIAGNOSIS — Z888 Allergy status to other drugs, medicaments and biological substances status: Secondary | ICD-10-CM

## 2021-10-01 HISTORY — PX: THORACIC DISCECTOMY: SHX6113

## 2021-10-01 LAB — CBC
HCT: 45.8 % (ref 39.0–52.0)
Hemoglobin: 15.4 g/dL (ref 13.0–17.0)
MCH: 30.3 pg (ref 26.0–34.0)
MCHC: 33.6 g/dL (ref 30.0–36.0)
MCV: 90.2 fL (ref 80.0–100.0)
Platelets: 199 10*3/uL (ref 150–400)
RBC: 5.08 MIL/uL (ref 4.22–5.81)
RDW: 12.4 % (ref 11.5–15.5)
WBC: 5 10*3/uL (ref 4.0–10.5)
nRBC: 0 % (ref 0.0–0.2)

## 2021-10-01 LAB — PROTIME-INR
INR: 0.9 (ref 0.8–1.2)
Prothrombin Time: 12.5 seconds (ref 11.4–15.2)

## 2021-10-01 LAB — BASIC METABOLIC PANEL
Anion gap: 7 (ref 5–15)
BUN: 20 mg/dL (ref 8–23)
CO2: 26 mmol/L (ref 22–32)
Calcium: 9.2 mg/dL (ref 8.9–10.3)
Chloride: 102 mmol/L (ref 98–111)
Creatinine, Ser: 1.07 mg/dL (ref 0.61–1.24)
GFR, Estimated: >60 mL/min (ref >60)
Glucose, Bld: 108 mg/dL — ABNORMAL HIGH (ref 70–99)
Potassium: 4.2 mmol/L (ref 3.5–5.1)
Sodium: 135 mmol/L (ref 135–145)

## 2021-10-01 LAB — TYPE AND SCREEN
ABO/RH(D): B POS
Antibody Screen: NEGATIVE

## 2021-10-01 LAB — SURGICAL PCR SCREEN
MRSA, PCR: NEGATIVE
Staphylococcus aureus: NEGATIVE

## 2021-10-01 LAB — SARS CORONAVIRUS 2 BY RT PCR (HOSPITAL ORDER, PERFORMED IN ~~LOC~~ HOSPITAL LAB): SARS Coronavirus 2: NEGATIVE

## 2021-10-01 SURGERY — THORACIC DISCECTOMY
Anesthesia: General | Site: Back

## 2021-10-01 MED ORDER — PHENOL 1.4 % MT LIQD: 1.0000 | OROMUCOSAL | Status: DC | PRN

## 2021-10-01 MED ORDER — CEFAZOLIN SODIUM-DEXTROSE 2-4 GM/100ML-% IV SOLN
2.0000 g | Freq: Three times a day (TID) | INTRAVENOUS | Status: AC
  Administered 2021-10-01 – 2021-10-02 (×2): 2 g via INTRAVENOUS
  Filled 2021-10-01 (×2): qty 100

## 2021-10-01 MED ORDER — ACETAMINOPHEN 325 MG PO TABS: 650.0000 mg | ORAL_TABLET | ORAL | Status: DC | PRN

## 2021-10-01 MED ORDER — DEXAMETHASONE 4 MG PO TABS
4.0000 mg | ORAL_TABLET | Freq: Four times a day (QID) | ORAL | Status: DC
  Administered 2021-10-01 – 2021-10-02 (×2): 4 mg via ORAL
  Filled 2021-10-01 (×2): qty 1

## 2021-10-01 MED ORDER — ACETAMINOPHEN 650 MG RE SUPP: 650.0000 mg | RECTAL | Status: DC | PRN

## 2021-10-01 MED ORDER — FENTANYL CITRATE (PF) 250 MCG/5ML IJ SOLN
INTRAMUSCULAR | Status: AC
  Filled 2021-10-01: qty 5

## 2021-10-01 MED ORDER — ROCURONIUM BROMIDE 10 MG/ML (PF) SYRINGE
PREFILLED_SYRINGE | INTRAVENOUS | Status: DC | PRN
  Administered 2021-10-01: 20 mg via INTRAVENOUS
  Administered 2021-10-01 (×2): 50 mg via INTRAVENOUS

## 2021-10-01 MED ORDER — ACETAMINOPHEN 500 MG PO TABS
1000.0000 mg | ORAL_TABLET | Freq: Once | ORAL | Status: AC
  Administered 2021-10-01: 1000 mg via ORAL
  Filled 2021-10-01: qty 2

## 2021-10-01 MED ORDER — PHENYLEPHRINE HCL-NACL 20-0.9 MG/250ML-% IV SOLN
INTRAVENOUS | Status: DC | PRN
  Administered 2021-10-01: 15 ug/min via INTRAVENOUS

## 2021-10-01 MED ORDER — ONDANSETRON HCL 4 MG/2ML IJ SOLN: 4.0000 mg | Freq: Once | INTRAMUSCULAR | Status: DC | PRN

## 2021-10-01 MED ORDER — SODIUM CHLORIDE 0.9% FLUSH
3.0000 mL | Freq: Two times a day (BID) | INTRAVENOUS | Status: DC
  Administered 2021-10-01: 3 mL via INTRAVENOUS

## 2021-10-01 MED ORDER — ONDANSETRON HCL 4 MG/2ML IJ SOLN
INTRAMUSCULAR | Status: AC
  Filled 2021-10-01: qty 2

## 2021-10-01 MED ORDER — LIDOCAINE 2% (20 MG/ML) 5 ML SYRINGE
INTRAMUSCULAR | Status: AC
  Filled 2021-10-01: qty 5

## 2021-10-01 MED ORDER — ROCURONIUM BROMIDE 10 MG/ML (PF) SYRINGE
PREFILLED_SYRINGE | INTRAVENOUS | Status: AC
  Filled 2021-10-01: qty 10

## 2021-10-01 MED ORDER — CEFAZOLIN SODIUM-DEXTROSE 2-4 GM/100ML-% IV SOLN
2.0000 g | INTRAVENOUS | Status: AC
  Administered 2021-10-01: 2 g via INTRAVENOUS
  Filled 2021-10-01: qty 100

## 2021-10-01 MED ORDER — CHLORHEXIDINE GLUCONATE CLOTH 2 % EX PADS: 6.0000 | MEDICATED_PAD | Freq: Once | CUTANEOUS | Status: DC

## 2021-10-01 MED ORDER — PROPOFOL 10 MG/ML IV BOLUS
INTRAVENOUS | Status: DC | PRN
  Administered 2021-10-01: 100 mg via INTRAVENOUS

## 2021-10-01 MED ORDER — FENTANYL CITRATE (PF) 100 MCG/2ML IJ SOLN
25.0000 ug | INTRAMUSCULAR | Status: DC | PRN
  Administered 2021-10-01: 25 ug via INTRAVENOUS
  Administered 2021-10-01: 50 ug via INTRAVENOUS
  Administered 2021-10-01: 25 ug via INTRAVENOUS

## 2021-10-01 MED ORDER — DEXAMETHASONE SODIUM PHOSPHATE 10 MG/ML IJ SOLN
INTRAMUSCULAR | Status: AC
  Filled 2021-10-01: qty 1

## 2021-10-01 MED ORDER — SENNA 8.6 MG PO TABS
1.0000 | ORAL_TABLET | Freq: Two times a day (BID) | ORAL | Status: DC
  Administered 2021-10-01 – 2021-10-02 (×2): 8.6 mg via ORAL
  Filled 2021-10-01 (×2): qty 1

## 2021-10-01 MED ORDER — FENTANYL CITRATE (PF) 250 MCG/5ML IJ SOLN
INTRAMUSCULAR | Status: DC | PRN
  Administered 2021-10-01 (×2): 50 ug via INTRAVENOUS
  Administered 2021-10-01 (×6): 25 ug via INTRAVENOUS

## 2021-10-01 MED ORDER — OXYCODONE HCL 5 MG/5ML PO SOLN: 5.0000 mg | Freq: Once | ORAL | Status: DC | PRN

## 2021-10-01 MED ORDER — METHOCARBAMOL 500 MG PO TABS
500.0000 mg | ORAL_TABLET | Freq: Four times a day (QID) | ORAL | Status: DC | PRN
  Administered 2021-10-01 – 2021-10-02 (×2): 500 mg via ORAL
  Filled 2021-10-01 (×2): qty 1

## 2021-10-01 MED ORDER — LIDOCAINE 2% (20 MG/ML) 5 ML SYRINGE
INTRAMUSCULAR | Status: DC | PRN
  Administered 2021-10-01: 60 mg via INTRAVENOUS

## 2021-10-01 MED ORDER — OXYCODONE HCL 5 MG PO TABS: 5.0000 mg | ORAL_TABLET | Freq: Once | ORAL | Status: DC | PRN

## 2021-10-01 MED ORDER — THROMBIN 5000 UNITS EX SOLR
CUTANEOUS | Status: AC
  Filled 2021-10-01: qty 5000

## 2021-10-01 MED ORDER — FENTANYL CITRATE (PF) 100 MCG/2ML IJ SOLN
INTRAMUSCULAR | Status: AC
  Filled 2021-10-01: qty 2

## 2021-10-01 MED ORDER — THROMBIN 5000 UNITS EX SOLR: OROMUCOSAL | Status: DC | PRN

## 2021-10-01 MED ORDER — PROPOFOL 500 MG/50ML IV EMUL
INTRAVENOUS | Status: DC | PRN
  Administered 2021-10-01: 25 ug/kg/min via INTRAVENOUS

## 2021-10-01 MED ORDER — PHENYLEPHRINE HCL (PRESSORS) 10 MG/ML IV SOLN
INTRAVENOUS | Status: DC | PRN
Start: 2021-10-01 — End: 2021-10-01
  Administered 2021-10-01: 80 ug via INTRAVENOUS

## 2021-10-01 MED ORDER — DEXAMETHASONE SODIUM PHOSPHATE 4 MG/ML IJ SOLN: 4.0000 mg | Freq: Four times a day (QID) | INTRAMUSCULAR | Status: DC

## 2021-10-01 MED ORDER — THROMBIN 20000 UNITS EX SOLR
CUTANEOUS | Status: AC
  Filled 2021-10-01: qty 20000

## 2021-10-01 MED ORDER — HYDROCODONE-ACETAMINOPHEN 7.5-325 MG PO TABS
1.0000 | ORAL_TABLET | Freq: Four times a day (QID) | ORAL | Status: DC
  Administered 2021-10-01 – 2021-10-02 (×3): 1 via ORAL
  Filled 2021-10-01 (×3): qty 1

## 2021-10-01 MED ORDER — ORAL CARE MOUTH RINSE: 15.0000 mL | Freq: Once | OROMUCOSAL | Status: AC

## 2021-10-01 MED ORDER — MENTHOL 3 MG MT LOZG: 1.0000 | LOZENGE | OROMUCOSAL | Status: DC | PRN

## 2021-10-01 MED ORDER — FESOTERODINE FUMARATE ER 4 MG PO TB24
4.0000 mg | ORAL_TABLET | Freq: Every day | ORAL | Status: DC
  Filled 2021-10-01 (×3): qty 1

## 2021-10-01 MED ORDER — METHOCARBAMOL 1000 MG/10ML IJ SOLN
500.0000 mg | Freq: Four times a day (QID) | INTRAVENOUS | Status: DC | PRN
  Filled 2021-10-01: qty 5

## 2021-10-01 MED ORDER — THROMBIN 20000 UNITS EX SOLR: CUTANEOUS | Status: DC | PRN

## 2021-10-01 MED ORDER — LACTATED RINGERS IV SOLN: INTRAVENOUS | Status: DC

## 2021-10-01 MED ORDER — ONDANSETRON HCL 4 MG/2ML IJ SOLN
INTRAMUSCULAR | Status: DC | PRN
Start: 2021-10-01 — End: 2021-10-01
  Administered 2021-10-01: 4 mg via INTRAVENOUS

## 2021-10-01 MED ORDER — CHLORHEXIDINE GLUCONATE 0.12 % MT SOLN
15.0000 mL | Freq: Once | OROMUCOSAL | Status: AC
  Administered 2021-10-01: 15 mL via OROMUCOSAL
  Filled 2021-10-01: qty 15

## 2021-10-01 MED ORDER — SUGAMMADEX SODIUM 200 MG/2ML IV SOLN
INTRAVENOUS | Status: DC | PRN
Start: 2021-10-01 — End: 2021-10-01
  Administered 2021-10-01: 300 mg via INTRAVENOUS

## 2021-10-01 MED ORDER — ONDANSETRON HCL 4 MG/2ML IJ SOLN: 4.0000 mg | Freq: Four times a day (QID) | INTRAMUSCULAR | Status: DC | PRN

## 2021-10-01 MED ORDER — SODIUM CHLORIDE 0.9 % IV SOLN: 250.0000 mL | INTRAVENOUS | Status: DC

## 2021-10-01 MED ORDER — ONDANSETRON HCL 4 MG PO TABS: 4.0000 mg | ORAL_TABLET | Freq: Four times a day (QID) | ORAL | Status: DC | PRN

## 2021-10-01 MED ORDER — MORPHINE SULFATE (PF) 2 MG/ML IV SOLN: 2.0000 mg | INTRAVENOUS | Status: DC | PRN

## 2021-10-01 MED ORDER — SODIUM CHLORIDE 0.9% FLUSH: 3.0000 mL | INTRAVENOUS | Status: DC | PRN

## 2021-10-01 MED ORDER — METOPROLOL TARTRATE 12.5 MG HALF TABLET
12.5000 mg | ORAL_TABLET | Freq: Every day | ORAL | Status: DC
  Administered 2021-10-01: 12.5 mg via ORAL
  Filled 2021-10-01 (×2): qty 1

## 2021-10-01 MED ORDER — BUPIVACAINE HCL (PF) 0.25 % IJ SOLN
INTRAMUSCULAR | Status: AC
  Filled 2021-10-01: qty 30

## 2021-10-01 MED ORDER — SUCCINYLCHOLINE CHLORIDE 200 MG/10ML IV SOSY
PREFILLED_SYRINGE | INTRAVENOUS | Status: AC
  Filled 2021-10-01: qty 10

## 2021-10-01 MED ORDER — 0.9 % SODIUM CHLORIDE (POUR BTL) OPTIME
TOPICAL | Status: DC | PRN
  Administered 2021-10-01: 1000 mL

## 2021-10-01 MED ORDER — BUPIVACAINE HCL (PF) 0.25 % IJ SOLN
INTRAMUSCULAR | Status: DC | PRN
  Administered 2021-10-01: 20 mL

## 2021-10-01 MED ORDER — POTASSIUM CHLORIDE IN NACL 20-0.9 MEQ/L-% IV SOLN: INTRAVENOUS | Status: DC

## 2021-10-01 SURGICAL SUPPLY — 42 items
BAG COUNTER SPONGE SURGICOUNT (BAG) ×2 IMPLANT
BAND RUBBER #18 3X1/16 STRL (MISCELLANEOUS) ×4 IMPLANT
BENZOIN TINCTURE PRP APPL 2/3 (GAUZE/BANDAGES/DRESSINGS) ×2 IMPLANT
BUR CARBIDE MATCH 3.0 (BURR) ×2 IMPLANT
CANISTER SUCT 3000ML PPV (MISCELLANEOUS) ×2 IMPLANT
DRAPE LAPAROTOMY 100X72X124 (DRAPES) ×2 IMPLANT
DRAPE MICROSCOPE LEICA (MISCELLANEOUS) ×2 IMPLANT
DRAPE SURG 17X23 STRL (DRAPES) ×2 IMPLANT
DURAPREP 26ML APPLICATOR (WOUND CARE) ×2 IMPLANT
ELECT REM PT RETURN 9FT ADLT (ELECTROSURGICAL) ×2
ELECTRODE REM PT RTRN 9FT ADLT (ELECTROSURGICAL) ×1 IMPLANT
GAUZE 4X4 16PLY ~~LOC~~+RFID DBL (SPONGE) IMPLANT
GLOVE SURG ENC MOIS LTX SZ7 (GLOVE) IMPLANT
GLOVE SURG ENC MOIS LTX SZ8 (GLOVE) ×2 IMPLANT
GLOVE SURG UNDER POLY LF SZ7 (GLOVE) IMPLANT
GOWN STRL REUS W/ TWL LRG LVL3 (GOWN DISPOSABLE) IMPLANT
GOWN STRL REUS W/ TWL XL LVL3 (GOWN DISPOSABLE) ×1 IMPLANT
GOWN STRL REUS W/TWL 2XL LVL3 (GOWN DISPOSABLE) IMPLANT
GOWN STRL REUS W/TWL LRG LVL3 (GOWN DISPOSABLE)
GOWN STRL REUS W/TWL XL LVL3 (GOWN DISPOSABLE) ×1
GRAFT BONE PROTEIOS LRG 5CC (Orthopedic Implant) ×1 IMPLANT
HEMOSTAT POWDER KIT SURGIFOAM (HEMOSTASIS) IMPLANT
KIT BASIN OR (CUSTOM PROCEDURE TRAY) ×2 IMPLANT
KIT TURNOVER KIT B (KITS) ×2 IMPLANT
MATRIX SPINE STRIP NEOCORE 5CC (Putty) IMPLANT
NDL HYPO 25X1 1.5 SAFETY (NEEDLE) ×1 IMPLANT
NDL SPNL 20GX3.5 QUINCKE YW (NEEDLE) IMPLANT
NEEDLE HYPO 25X1 1.5 SAFETY (NEEDLE) ×2 IMPLANT
NEEDLE SPNL 20GX3.5 QUINCKE YW (NEEDLE) IMPLANT
NS IRRIG 1000ML POUR BTL (IV SOLUTION) ×2 IMPLANT
PACK LAMINECTOMY NEURO (CUSTOM PROCEDURE TRAY) ×2 IMPLANT
PAD ARMBOARD 7.5X6 YLW CONV (MISCELLANEOUS) ×6 IMPLANT
SPONGE SURGIFOAM ABS GEL SZ50 (HEMOSTASIS) ×2 IMPLANT
STRIP CLOSURE SKIN 1/2X4 (GAUZE/BANDAGES/DRESSINGS) ×2 IMPLANT
STRIP MATRIX NEOCORE 5CC (Putty) ×1 IMPLANT
SUT VIC AB 0 CT1 18XCR BRD8 (SUTURE) ×1 IMPLANT
SUT VIC AB 0 CT1 8-18 (SUTURE) ×1
SUT VIC AB 2-0 CP2 18 (SUTURE) ×2 IMPLANT
SUT VIC AB 3-0 SH 8-18 (SUTURE) ×2 IMPLANT
TOWEL GREEN STERILE (TOWEL DISPOSABLE) ×2 IMPLANT
TOWEL GREEN STERILE FF (TOWEL DISPOSABLE) ×2 IMPLANT
WATER STERILE IRR 1000ML POUR (IV SOLUTION) ×2 IMPLANT

## 2021-10-01 NOTE — Anesthesia Procedure Notes (Signed)
Procedure Name: Intubation Date/Time: 10/01/2021 1:55 PM Performed by: Clearnce Sorrel, CRNA Pre-anesthesia Checklist: Patient identified, Emergency Drugs available, Suction available and Patient being monitored Patient Re-evaluated:Patient Re-evaluated prior to induction Oxygen Delivery Method: Circle System Utilized Preoxygenation: Pre-oxygenation with 100% oxygen Induction Type: IV induction Ventilation: Mask ventilation without difficulty Laryngoscope Size: Glidescope and 4 (hx of difficult airway) Grade View: Grade I Tube type: Oral Tube size: 7.5 mm Number of attempts: 1 Airway Equipment and Method: Stylet and Oral airway Placement Confirmation: ETT inserted through vocal cords under direct vision, positive ETCO2 and breath sounds checked- equal and bilateral Secured at: 24 cm Tube secured with: Tape Dental Injury: Teeth and Oropharynx as per pre-operative assessment

## 2021-10-01 NOTE — Transfer of Care (Signed)
Immediate Anesthesia Transfer of Care Note  Patient: LEEVON UPPERMAN  Procedure(s) Performed: T11-12 Decompression with possible instrumented fusion (Back)  Patient Location: PACU  Anesthesia Type:General  Level of Consciousness: awake  Airway & Oxygen Therapy: Patient Spontanous Breathing  Post-op Assessment: Report given to RN and Post -op Vital signs reviewed and stable  Post vital signs: Reviewed and stable  Last Vitals:  Vitals Value Taken Time  BP 142/82 10/01/21 1654  Temp    Pulse 57 10/01/21 1656  Resp 17 10/01/21 1656  SpO2 98 % 10/01/21 1656  Vitals shown include unvalidated device data.  Last Pain:  Vitals:   10/01/21 1036  TempSrc:   PainSc: 0-No pain         Complications: No notable events documented.

## 2021-10-01 NOTE — H&P (Signed)
Subjective: Patient is a 86 y.o. male admitted for myelopathy. Onset of symptoms was a few weeks ago, gradually worsening since that time.  The pain is rated severe, and is located at the across the lower back and radiates to legs. The pain is described as aching and occurs all day. The symptoms have been progressive. Symptoms are exacerbated by walking for more than several minutes. MRI or CT showed severe spinal stenosis with spondylosis at T11-12 with signal change in the spinal cord.  He was having difficulty with gait and was found to be hyperreflexic by neurologist and referred to [place].  He has had MRI of the entire neuraxis.  He has some weakness in the hip flexors and spastic gait.  He has had some bowel difficulty.  Past Medical History:  Diagnosis Date   Allergy    Arthritis    Cataracts, bilateral    bil cataracts removed   DDD (degenerative disc disease), lumbosacral    pain radiates down left leg   Environmental and seasonal allergies    GERD (gastroesophageal reflux disease)    Hearing loss    bilateral hearing aids   Heart murmur    recently  saw cardiologist and said it was fine    Hx of adenomatous colonic polyps 04/23/2016   Hyperlipidemia    Wound dehiscence 05/31/2017    Past Surgical History:  Procedure Laterality Date   APPLICATION OF ROBOTIC ASSISTANCE FOR SPINAL PROCEDURE N/A 05/27/2017   Procedure: APPLICATION OF ROBOTIC ASSISTANCE FOR SPINAL PROCEDURE;  Surgeon: Ditty, Kevan Ny, MD;  Location: [facility];  Service: Neurosurgery;  Laterality: N/A;   cataract     bilat   CIRCUMCISION     COLONOSCOPY     COLONOSCOPY W/ POLYPECTOMY     LAMINECTOMY  05/27/2017   SACRAL   LUMBAR WOUND DEBRIDEMENT N/A 05/31/2017   Procedure: LUMBAR WOUND REVISION;  Surgeon: Eustace Moore, MD;  Location: [facility];  Service: Neurosurgery;  Laterality: N/A;   TONSILLECTOMY     wisdom teth extraction      Prior to Admission medications   Medication Sig Start Date End Date Taking?  Authorizing Provider  ALPRAZolam Duanne Moron) 0.5 MG tablet Take 1 tablet (0.5 mg total) by mouth at bedtime as needed for anxiety. Take one or two before MRI 09/03/21  Yes Sater, Nanine Means, MD  atorvastatin (LIPITOR) 10 MG tablet Take 10 mg by mouth daily.   Yes [provider]  metoprolol tartrate (LOPRESSOR) 25 MG tablet Take 0.5 tablets (12.5 mg total) by mouth 2 (two) times daily. Patient taking differently: Take 12.5 mg by mouth daily. 06/04/17  Yes Meyran, Ocie Cornfield, NP  Multiple Vitamin (MULTIVITAMIN) tablet Take 1 tablet by mouth daily. Senior formula   Yes [provider]  Omega-3 Fatty Acids (FISH OIL PO) Take 1 capsule by mouth daily.   Yes [provider]  tolterodine (DETROL LA) 2 MG 24 hr capsule Take 1 capsule (2 mg total) by mouth daily. 09/03/21  Yes Sater, Nanine Means, MD  aspirin EC 81 MG tablet Take 81 mg by mouth daily.    [provider]  Carboxymeth-Glycerin-Polysorb (REFRESH OPTIVE ADVANCED OP) Place 1 drop into both eyes as needed.     [provider]   Allergies  Allergen Reactions   Gabapentin Other (See Comments)    "Mental status/awareness changes"    Social History   Tobacco Use   Smoking status: Former    Packs/day: 1.00    Types: Cigarettes  Quit date: 08/02/2016    Years since quitting: 5.1   Smokeless tobacco: Never  Substance Use Topics   Alcohol use: Yes    Alcohol/week: 7.0 standard drinks    Types: 7 Glasses of wine per week    Family History  Problem Relation Age of Onset   Alzheimer's disease Father    Stroke Brother    Colon cancer Neg Hx    Esophageal cancer Neg Hx    Rectal cancer Neg Hx    Stomach cancer Neg Hx    Pancreatic cancer Neg Hx    Prostate cancer Neg Hx      Review of Systems  Positive ROS: Negative  All other systems have been reviewed and were otherwise negative with the exception of those mentioned in the HPI and as above.  Objective: Vital signs in last 24  hours: Temp:  [97.6 F (36.4 C)] 97.6 F (36.4 C) (02/13 1022) Pulse Rate:  [61] 61 (02/13 1022) Resp:  [17] 17 (02/13 1022) BP: (170-205)/(76-78) 170/76 (02/13 1036) SpO2:  [98 %] 98 % (02/13 1022) Weight:  [74.8 kg] 74.8 kg (02/13 1022)  General Appearance: Alert, cooperative, no distress, appears stated age Head: Normocephalic, without obvious abnormality, atraumatic Eyes: PERRL, conjunctiva/corneas clear, EOM's intact    Neck: Supple, symmetrical, trachea midline Back: Symmetric, no curvature, ROM normal, no CVA tenderness Lungs:  respirations unlabored Heart: Regular rate and rhythm Abdomen: Soft, non-tender Extremities: Extremities normal, atraumatic, no cyanosis or edema Pulses: 2+ and symmetric all extremities Skin: Skin color, texture, turgor normal, no rashes or lesions  NEUROLOGIC:   Mental status: Alert and oriented x4,  no aphasia, good attention span, fund of knowledge, and memory Motor Exam - grossly normal except some weakness in the proximal lower extremities Sensory Exam - grossly normal Reflexes: 3+ Coordination - grossly normal Gait -spastic Balance -decrease Cranial Nerves: I: smell Not tested  II: visual acuity  OS: nl    OD: nl  II: visual fields Full to confrontation  II: pupils Equal, round, reactive to light  III,VII: ptosis None  III,IV,VI: extraocular muscles  Full ROM  V: mastication Normal  V: facial light touch sensation  Normal  V,VII: corneal reflex  Present  VII: facial muscle function - upper  Normal  VII: facial muscle function - lower Normal  VIII: hearing Not tested  IX: soft palate elevation  Normal  IX,X: gag reflex Present  XI: trapezius strength  5/5  XI: sternocleidomastoid strength 5/5  XI: neck flexion strength  5/5  XII: tongue strength  Normal    Data Review Lab Results  Component Value Date   WBC 5.0 10/01/2021   HGB 15.4 10/01/2021   HCT 45.8 10/01/2021   MCV 90.2 10/01/2021   PLT 199 10/01/2021   Lab  Results  Component Value Date   NA 135 10/01/2021   K 4.2 10/01/2021   CL 102 10/01/2021   CO2 26 10/01/2021   BUN 20 10/01/2021   CREATININE 1.07 10/01/2021   GLUCOSE 108 (H) 10/01/2021   Lab Results  Component Value Date   INR 0.9 10/01/2021    Assessment/Plan:  Estimated body mass index is 23.01 kg/m as calculated from the following:   Height as of this encounter: 5\' 11"  (1.803 m).   Weight as of this encounter: 74.8 kg. Patient admitted for decompressive laminectomy T11-12 with possible fusion. Patient has failed a reasonable attempt at conservative therapy.  I explained the condition and procedure to the patient and answered any  questions.  Patient wishes to proceed with procedure as planned. Understands risks/ benefits and typical outcomes of procedure.  He understands the risk of the surgery include but are not limited to bleeding, infection, spinal cord injury, nerve root injury, CSF leak, numbness, weakness, paralysis, loss of bowel bladder or sexual function, lack of relief of symptoms, worsening symptoms, for further surgery, instability, need for stabilization, need for fusion, and anesthesia risk including DVT pneumonia MI and death.  They agree to proceed.   Eustace Moore 10/01/2021 1:20 PM

## 2021-10-01 NOTE — Anesthesia Preprocedure Evaluation (Addendum)
Anesthesia Evaluation  Patient identified by MRN, date of birth, ID band Patient awake    Reviewed: Allergy & Precautions, NPO status , Patient's Chart, lab work & pertinent test results, reviewed documented beta blocker date and time   History of Anesthesia Complications Negative for: history of anesthetic complications  Airway Mallampati: II  TM Distance: >3 FB Neck ROM: Full    Dental  (+) Dental Advisory Given, Teeth Intact   Pulmonary former smoker,    Pulmonary exam normal        Cardiovascular Normal cardiovascular exam+ dysrhythmias Atrial Fibrillation      Neuro/Psych  Hearing loss  negative psych ROS   GI/Hepatic Neg liver ROS, GERD  Controlled,  Endo/Other  negative endocrine ROS  Renal/GU negative Renal ROS     Musculoskeletal  (+) Arthritis ,   Abdominal   Peds  Hematology negative hematology ROS (+)   Anesthesia Other Findings   Reproductive/Obstetrics                            Anesthesia Physical Anesthesia Plan  ASA: 2  Anesthesia Plan: General   Post-op Pain Management: Tylenol PO (pre-op)   Induction: Intravenous  PONV Risk Score and Plan: 2 and Treatment may vary due to age or medical condition, Ondansetron and Propofol infusion  Airway Management Planned: Oral ETT  Additional Equipment: None  Intra-op Plan:   Post-operative Plan: Extubation in OR  Informed Consent: I have reviewed the patients History and Physical, chart, labs and discussed the procedure including the risks, benefits and alternatives for the proposed anesthesia with the patient or authorized representative who has indicated his/her understanding and acceptance.     Dental advisory given  Plan Discussed with: CRNA and Anesthesiologist  Anesthesia Plan Comments:        Anesthesia Quick Evaluation

## 2021-10-01 NOTE — Op Note (Signed)
10/01/2021  4:44 PM  PATIENT:  Glen [name]  86 y.o. male  PRE-OPERATIVE DIAGNOSIS: Thoracic spinal stenosis T11-12, subluxation T11-12, myelopathy T11-12 with leg weakness and gait disturbance  POST-OPERATIVE DIAGNOSIS:  same  PROCEDURE: Thoracic laminectomy T11-12 with bilateral medial facetectomy, followed by left posterior lateral fusion using locally harvested morselized autologous bone graft and morselized allograft  SURGEON:  Sherley Bounds, MD  ASSISTANTS: Glenford Peers FNP  ANESTHESIA:   General  EBL: Less than 50 ml  No intake/output data recorded.  BLOOD ADMINISTERED: none  DRAINS: None  SPECIMEN:  none  INDICATION FOR PROCEDURE: This patient presented with gait disturbance with spastic gait and weakness in his proximal legs. Imaging showed severe stenosis T11-12 with signal change in the spinal cord. The patient tried conservative measures without relief. Pain was debilitating. Recommended thoracic laminectomy with possible instrumented fusion. Patient understood the risks, benefits, and alternatives and potential outcomes and wished to proceed.  PROCEDURE DETAILS: The patient was taken to the operating room and after induction of adequate generalized endotracheal anesthesia, the patient was rolled into the prone position on chest rolls and all pressure points were padded.  We marked our incision with AP fluoroscopy localizing the T11 and T12 pedicles.  The thoracic and lumbar region was cleaned and then prepped with DuraPrep and draped in the usual sterile fashion. 5 cc of local anesthesia was injected and then a dorsal midline incision was made and carried down to the fascia. The fascia was opened and the paraspinous musculature was taken down in a subperiosteal fashion to expose T11-12 bilaterally. Intraoperative fluoroscopy confirmed my level a marker at the T12 pedicle counting up from the L2 pedicle screws, we could see the subluxation of T11 on T12.  I remove the  T11 spinous process used a combination of the high-speed drill and the Kerrison punches to perform a full laminectomy, medial facetectomy, and foraminotomy at T11-12 bilaterally.  I drilled the bone down to an eggshell both centrally and laterally.  I drilled the top of T12 also.  We saved all drill shavings in a mucous trap for later arthrodesis.  The underlying yellow ligament was opened and removed in a piecemeal fashion to expose the underlying dura.  There was severe compression of the dura at the disc space level pinching the dura medially from both sides.  I gently dissected between the ligament and soft tissue, and out of the facet that was causing the compression.  I undercut the lateral recess this released the tissue and I was able to remove it from the lateral recess with no pressure on the dura.  And dissected down until I was medial to and distal to the T12 pedicle.  The thecal sac appeared to be well decompressed from the inferior part of the T11 pedicle to the mid T12 pedicle.  We were very careful to not cause any downward pressure on the dura during the decompression.  I irrigated with saline solution.  We exposed pedicle screw entry zone regions of T11 and T12 and dissected out over the lateral facet to expose the rib heads.  We did feel like there was some mild segmental instability at this level but did not feel it was grossly unstable and therefore decided not to place pedicle screws.  We did drill the rib heads and lateral facets and placed our bone graft that we saved during the decompression with a mixture of allograft out over these to perform arthrodesis T11-12 on the left.  We  achieved hemostasis with bipolar cautery, lined the dura with Gelfoam, and then closed the fascia with 0 Vicryl. I closed the subcutaneous tissues with 2-0 Vicryl and the subcuticular tissues with 3-0 Vicryl. The skin was then closed with benzoin and Steri-Strips. The drapes were removed, a sterile dressing was  applied.  My nurse practitioner was involved in the exposure, safe decompression of the neural elements, the fusion, and the closure. the patient was awakened from general anesthesia and transferred to the recovery room in stable condition. At the end of the procedure all sponge, needle and instrument counts were correct.    PLAN OF CARE: Admit for overnight observation  PATIENT DISPOSITION:  PACU - hemodynamically stable.   Delay start of Pharmacological VTE agent (>24hrs) due to surgical blood loss or risk of bleeding:  yes

## 2021-10-02 ENCOUNTER — Encounter (HOSPITAL_COMMUNITY): Payer: Self-pay | Admitting: Neurological Surgery

## 2021-10-02 DIAGNOSIS — Z87891 Personal history of nicotine dependence: Secondary | ICD-10-CM | POA: Diagnosis not present

## 2021-10-02 DIAGNOSIS — R2681 Unsteadiness on feet: Secondary | ICD-10-CM | POA: Diagnosis present

## 2021-10-02 DIAGNOSIS — Z9842 Cataract extraction status, left eye: Secondary | ICD-10-CM | POA: Diagnosis not present

## 2021-10-02 DIAGNOSIS — Z820 Family history of epilepsy and other diseases of the nervous system: Secondary | ICD-10-CM | POA: Diagnosis not present

## 2021-10-02 DIAGNOSIS — Z86010 Personal history of colonic polyps: Secondary | ICD-10-CM | POA: Diagnosis not present

## 2021-10-02 DIAGNOSIS — K219 Gastro-esophageal reflux disease without esophagitis: Secondary | ICD-10-CM | POA: Diagnosis present

## 2021-10-02 DIAGNOSIS — I4891 Unspecified atrial fibrillation: Secondary | ICD-10-CM | POA: Diagnosis present

## 2021-10-02 DIAGNOSIS — F419 Anxiety disorder, unspecified: Secondary | ICD-10-CM | POA: Diagnosis present

## 2021-10-02 DIAGNOSIS — Z974 Presence of external hearing-aid: Secondary | ICD-10-CM | POA: Diagnosis not present

## 2021-10-02 DIAGNOSIS — Z7982 Long term (current) use of aspirin: Secondary | ICD-10-CM | POA: Diagnosis not present

## 2021-10-02 DIAGNOSIS — R292 Abnormal reflex: Secondary | ICD-10-CM | POA: Diagnosis present

## 2021-10-02 DIAGNOSIS — H9193 Unspecified hearing loss, bilateral: Secondary | ICD-10-CM | POA: Diagnosis present

## 2021-10-02 DIAGNOSIS — M4804 Spinal stenosis, thoracic region: Secondary | ICD-10-CM | POA: Diagnosis present

## 2021-10-02 DIAGNOSIS — E785 Hyperlipidemia, unspecified: Secondary | ICD-10-CM | POA: Diagnosis present

## 2021-10-02 DIAGNOSIS — Z888 Allergy status to other drugs, medicaments and biological substances status: Secondary | ICD-10-CM | POA: Diagnosis not present

## 2021-10-02 DIAGNOSIS — Z9841 Cataract extraction status, right eye: Secondary | ICD-10-CM | POA: Diagnosis not present

## 2021-10-02 DIAGNOSIS — M1990 Unspecified osteoarthritis, unspecified site: Secondary | ICD-10-CM | POA: Diagnosis present

## 2021-10-02 DIAGNOSIS — Z823 Family history of stroke: Secondary | ICD-10-CM | POA: Diagnosis not present

## 2021-10-02 DIAGNOSIS — M4714 Other spondylosis with myelopathy, thoracic region: Secondary | ICD-10-CM | POA: Diagnosis present

## 2021-10-02 DIAGNOSIS — Z20822 Contact with and (suspected) exposure to covid-19: Secondary | ICD-10-CM | POA: Diagnosis present

## 2021-10-02 MED ORDER — HYDROCODONE-ACETAMINOPHEN 7.5-325 MG PO TABS: 1.0000 | ORAL_TABLET | Freq: Four times a day (QID) | ORAL | 0 refills | Status: DC

## 2021-10-02 MED ORDER — METHOCARBAMOL 500 MG PO TABS: 500.0000 mg | ORAL_TABLET | Freq: Four times a day (QID) | ORAL | 0 refills | Status: DC

## 2021-10-02 NOTE — Discharge Summary (Signed)
Physician Discharge Summary  Patient ID: Glen [name] MRN: [id number] DOB/AGE: 84/29/1937 84 y.o.  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 The patient tolerated the procedure well and was taken to the recovery room and then to the floor in stable condition. The hospital course was routine. There were no complications. The wound remained clean dry and intact. Pt had appropriate back soreness. No complaints of leg pain or new N/T/W. The patient remained afebrile with stable vital signs, and tolerated a regular diet. The patient continued to increase activities, and pain was well controlled with oral pain medications.   Consults: None  Significant Diagnostic Studies:  Results for orders placed or performed during the hospital encounter of 10/01/21  SARS Coronavirus 2 by RT PCR (hospital order, performed in [facility] hospital lab) Nasopharyngeal Nasopharyngeal Swab   Specimen: Nasopharyngeal Swab  Result Value Ref Range   SARS Coronavirus 2 NEGATIVE NEGATIVE  Surgical PCR Screen   Specimen: Nasal Mucosa; Nasal Swab  Result Value Ref Range   MRSA, PCR NEGATIVE NEGATIVE   Staphylococcus aureus NEGATIVE NEGATIVE  Protime-INR  Result Value Ref Range   Prothrombin Time 12.5 11.4 - 15.2 seconds   INR 0.9 0.8 - 1.2  CBC per protocol  Result Value Ref Range   WBC 5.0 4.0 - 10.5 K/uL   RBC 5.08 4.22 - 5.81 MIL/uL   Hemoglobin 15.4 13.0 - 17.0 g/dL   HCT 45.8 39.0 - 52.0 %   MCV 90.2 80.0 - 100.0 fL   MCH 30.3 26.0 - 34.0 pg   MCHC 33.6 30.0 - 36.0 g/dL   RDW 12.4 11.5 - 15.5 %   Platelets 199 150 - 400 K/uL   nRBC 0.0 0.0 - 0.2 %  Basic metabolic panel per  protocol  Result Value Ref Range   Sodium 135 135 - 145 mmol/L   Potassium 4.2 3.5 - 5.1 mmol/L   Chloride 102 98 - 111 mmol/L   CO2 26 22 - 32 mmol/L   Glucose, Bld 108 (H) 70 - 99 mg/dL   BUN 20 8 - 23 mg/dL   Creatinine, Ser 1.07 0.61 - 1.24 mg/dL   Calcium 9.2 8.9 - 10.3 mg/dL   GFR, Estimated >60 >60 mL/min   Anion gap 7 5 - 15  Type and screen [place]  Result Value Ref Range   ABO/RH(D) B POS    Antibody Screen NEG    Sample Expiration      10/04/2021,2359 Performed at [facility], [address], [place], [place] [address]     DG THORACOLUMABAR SPINE  Result Date: 10/01/2021 CLINICAL DATA:  T11-T12 decompression EXAM: THORACOLUMBAR SPINE 1V COMPARISON:  08/01/2021 FINDINGS: Two fluoroscopic images are obtained during the performance of the procedure and are provided for interpretation only. The superior extent of previous lumbar fusion hardware is identified. Surgical instrumentation is seen at the T11/T12 disc space on the second image. Please refer to the operative report. Fluoroscopy time: 7 seconds, 2.46 mGy IMPRESSION: 1. Intraoperative examination as above. Please refer to the operative report. Electronically Signed   By: Randa Ngo M.D.   On: 10/01/2021 16:54   CT LUMBAR SPINE WO CONTRAST  Result Date: 09/22/2021 CLINICAL  DATA:  Bilateral leg weakness and tingling left worse than right. Unsteady gait. Previous lumbar fusion. EXAM: CT LUMBAR SPINE WITHOUT CONTRAST TECHNIQUE: Multidetector CT imaging of the lumbar spine was performed without intravenous contrast administration. Multiplanar CT image reconstructions were also generated. RADIATION DOSE REDUCTION: This exam was performed according to the departmental dose-optimization program which includes automated exposure control, adjustment of the mA and/or kV according to patient size and/or use of iterative reconstruction technique. COMPARISON:  05/08/2017 FINDINGS: Segmentation: Same  numbering terminology used with 5 lumbar type vertebral bodies. Alignment: Mild scoliotic curvature convex to the right. Fixed anterolisthesis at L5-S1 of 3 mm. Vertebrae: No fracture or focal bone lesion. No evidence of hardware failure or loosening. Pedicle screws and posterior rods from L2-S1 with bilateral sacroiliac extensions. Paraspinal and other soft tissues: Aortic atherosclerosis. Disc levels: T12-L1: Disc degeneration with vacuum phenomenon. Mild bulging of the disc. Bilateral facet osteoarthritis. No visible stenosis. L1-2: Advanced disc degeneration with vacuum phenomenon. Bulging of the disc. Facet and ligamentous hypertrophy. Multifactorial stenosis at this level that could be symptomatic. L2 to sacrum: Pedicle screws from L2 through S1. Screws are well position in there is no evidence of screw loosening. Solid union from L2-S1. Interbody spacers at L5-S1 without subsidence. Sufficient patency of the canal and foramina. Both sacroiliac extensions show the screws traversing the sacrum without evidence of loosening. Neither screw crosses the sacroiliac joint however, passing anterior to the sacroiliac joints. IMPRESSION: Interval fusion from L2 to the sacrum. Marked worsening of adjacent segment degenerative disease at T12-L1 and specially L1-2 which could be painful. There is potential for significant spinal stenosis at the L1-2 level. Solid fusion from L2 to the sacrum. Apparent sufficient patency of the canal and foramina. Sacroiliac extensions screws pass through the sacrum without evidence of loosening, but pass anterior to the sacroiliac joints and do not actually enter the iliac bones. On the left, there is some bony encasement of the screw that apparently developed from the anterior periosteum. Electronically Signed   By: Nelson Chimes M.D.   On: 09/22/2021 21:38   MR BRAIN WO CONTRAST  Result Date: 09/15/2021  [facility] [address], [place], [place] [address]  [phone] NEUROIMAGING REPORT STUDY DATE: 09/15/2021 PATIENT NAME: Fain Francis DOB: 09/03/1935 MRN: [id number] EXAM: MRI Brain without contrast ORDERING CLINICIAN: Richard A. Sater, MD. PhD CLINICAL HISTORY: 86 year old man with gait disturbance, bladder changes and leg numbness and weakness COMPARISON FILMS: None TECHNIQUE: MRI of the brain without contrast was obtained utilizing 5 mm axial slices with T1, T2, T2 flair, SWI and diffusion weighted views.  T1 sagittal and T2 coronal views were obtained. CONTRAST: none IMAGING SITE: [facility], [place], [employer] FINDINGS: On sagittal images, the spinal cord is imaged caudally to C2-C3 and is normal in caliber.   The contents of the posterior fossa are of normal size and position.   The pituitary gland and optic chiasm appear normal.    Mild to moderate generalized cortical atrophy.  There are no abnormal extra-axial collections of fluid.  The cerebellum and brainstem appears normal.   The deep gray matter appears normal.  The cerebral hemispheres appear normal for age with just a couple punctate T2/FLAIR hyperintense foci in the white matter..  Diffusion weighted images are normal.  Susceptibility weighted images are normal.  There have been bilateral lens replacements.  Otherwise, the orbits appear normal.   The VIIth/VIIIth nerve complex appears normal.  The mastoid air cells appear normal.  The left maxillary sinus is opacified with inflammatory changes, possibly a mucocele.  The other paranasal sinuses appear normal.  Flow voids are identified within the major intracerebral arteries.     This MRI of the brain without contrast shows the following: 1.   Mild to moderate generalized cortical atrophy.  Ventricle size is proportionate to the extent of atrophy. 2.   The brain has normal signal for age.  No significant ischemic changes. 3,    Left maxillary mucocele or advanced chronic sinusitis.  The other paranasal sinuses appear normal. 4.     No acute findings. INTERPRETING PHYSICIAN: Richard A. Felecia Shelling, MD, PhD, FAAN Certified in  Neuroimaging by [place] [employer]   MR [place]  Result Date: 09/15/2021  [facility] [address], [place], [place] [address] [phone] NEUROIMAGING REPORT STUDY DATE: 09/15/2021 PATIENT NAME: Doyle Kunath DOB: 1935/12/12 MRN: [id number] EXAM: MRI of the cervical spine without contrast ORDERING CLINICIAN: Richard A. Sater, MD. PhD CLINICAL HISTORY: 86 year old man with gait disturbance, bladder changes and leg numbness and weakness COMPARISON FILMS: None TECHNIQUE: MRI of the cervical spine was obtained utilizing 3 mm sagittal slices from the posterior fossa down to the T3-4 level with T1, T2 and inversion recovery views. In addition 4 mm axial slices from X3-8 down to T1-2 level were included with T2 and gradient echo views. CONTRAST: None IMAGING SITE: [facility], [place], [place], [place] FINDINGS: :  On sagittal images, the spine is imaged from above the cervicomedullary junction to T2.  Paravertebral soft tissue appears normal.  The visible brain in the cervicomedullary junction appears normal.  The spinal cord is of normal caliber and signal.   The vertebral bodies are normally aligned.   The vertebral bodies have normal signal.  Mild disc height reduction is noted at C5-C6.  The discs and interspaces were further evaluated on axial views from C2 to T1 as follows: C2-C3: There is mild disc bulging and a small right disc osteophyte complex causing mild right foraminal narrowing but no spinal stenosis or nerve root compression. C3-C4: There are disc osteophyte complexes bilaterally and mild left facet hypertrophy.  Causing mild to moderate left and mild right foraminal narrowing but no spinal stenosis or nerve root compression. C4-C5: There is right disc protrusion and mild uncovertebral spurring, mild facet hypertrophy and mild ligamenta flava  hypertrophy causing moderately severe right foraminal narrowing with potential for right C5 nerve root compression.  Milder degenerative changes on the left lead to mild left foraminal narrowing but no nerve root compression.  The central canal is narrowed but not enough to be considered spinal stenosis. C5-C6: There is mild spinal stenosis due to disc bulging, mild uncovertebral spurring, mild facet hypertrophy and advanced ligamentum flavum hypertrophy.  There is mild right greater than left foraminal narrowing but no nerve root compression. C6-C7: There is minimal disc bulging and minimal left uncovertebral spurring but no spinal stenosis or nerve root compression. C7-T1: The disc and interspace appear normal.   This MRI of the cervical spine without contrast shows the following: 1.  The spinal cord appears normal. 2.   At C4-C5, there is a right disc protrusion and other degenerative changes causing moderately severe right foraminal narrowing with potential for right C5 nerve root compression. 3.   At C5-C6, there are degenerative changes causing mild spinal stenosis but no nerve root compression. 4.   Milder degenerative changes at the other cervical levels do not lead  to spinal stenosis or nerve root compression. INTERPRETING PHYSICIAN: Richard A. Felecia Shelling, MD, PhD, FAAN Certified in  Neuroimaging by [employer]   MR THORACIC SPINE WO CONTRAST  Addendum Date: 09/17/2021   Addendum: A transcription error occurred.  Impression #3 should read "At T8-T9, there is a central disc protrusion and other degenerative changes corresponding to the linear calcifications noted on CT scan in 2018.  There does not appear to be nerve root compression or significant spinal stenosis at this level.    Result Date: 09/15/2021 [facility] [address] [place], [place] [address] [phone] NEUROIMAGING REPORT STUDY DATE: 09/15/2021 PATIENT NAME: Karis Emig DOB: 07/30/1936 MRN:  [id number] EXAM: MRI of the thoracic spine ORDERING CLINICIAN: Richard A. Felecia Shelling, MD. PhD CLINICAL HISTORY: 86 year old man with gait disturbance, urinary dysfunction, leg numbness and weakness COMPARISON FILMS: CT 05/08/2017 TECHNIQUE: MRI of the thoracic spine was obtained utilizing 3 mm sagittal slices from the posterior fossa from C7 to L1 level with T1, T2 and inversion recovery views. In addition 4 mm axial slices from T1 to L1 level were included with T2 and gradient echo views. CONTRAST: None IMAGING SITE: [facility], [place], [place], [place] FINDINGS: :  On scout images, the spine is imaged from above the cervicomedullary junction to the lower thoracic spine.  The spinal cord is of normal caliber.  There is a suggestion of increased signal within the spinal cord on the sagittal STIR images not clearly seen on the axial T2 and GE medic images.   The vertebral bodies are normally aligned.   There is reduced disc height at T11-T12 associated with severe edema within the adjacent endplates.  Edema is also noted within the disc at that level and to a much lesser extent at T9-T10..  The discs and interspaces were further evaluated on axial views from C7 to L1 as follow: T1-T2 through T7-T8: The discs and interspaces appear normal.  No spinal stenosis or nerve root compression. T8-T9: There is central disc protrusion and mild ligamenta flava hypertrophy.  This corresponds to the linear calcifications noted on the CT scan from 2018.  There is also a moderate anterior bridging osteophyte.  The calcified disc effaces the thecal sac without causing spinal cord compression or spinal stenosis.  There is no nerve root compression. T9-T10: The disc appears normal.  There is a large anterior bridging osteophyte.  No spinal stenosis or nerve root compression.   T10-T11: There is a right paramedian disc protrusion causing minimal right foraminal narrowing but no spinal stenosis or nerve root compression.  A  large right anterior bridging osteophyte is also noted. T11-T12: There is moderately severe spinal stenosis, worse on the right due to disc protrusion, endplate spurring, facet hypertrophy with joint effusions and ligamenta flava hypertrophy.  There is potential for T11 and T12 nerve root compression to either side..  Compared to the CT scan from 05/08/2017, the degenerative changes have markedly progressed. T12-L1: There is disc bulging and facet hypertrophy causing borderline spinal stenosis but no nerve root compression.   This MRI of the thoracic spine without contrast shows the following: 1.   On the sagittal STIR images there appears to be subtle myelopathic signal within the spinal cord related to the moderately severe spinal stenosis at T11-T12.  The increased signal is not confirmed on axial images. 2.   At T11-T12, there is moderately severe spinal stenosis, right greater than left, due to disc protrusion, endplate spurring, facet hypertrophy  with joint effusions and ligamenta flava hypertrophy.  There is potential for T11 and T12 nerve root compression to either side at this level.  There is severe edema within the disc and adjacent endplate edema.  Although this is more likely to be degenerative, consider assessing for discitis.  The degenerative changes have markedly progressed compared to the 2018 CT scan. 3.   At T8-T9, there is a central disc protrusion and other degenerative changes corresponding to the injury 5 septations noted on CT scan in 2018.  There does not appear to be nerve root compression or significant spinal stenosis at this level.  4.   Milder degenerative changes at other levels do not lead to nerve root compression or spinal stenosis. INTERPRETING PHYSICIAN: Richard A. Felecia Shelling, MD, PhD, FAAN Certified in  Neuroimaging by [employer] \  MR LUMBAR SPINE WO CONTRAST  Result Date: 09/28/2021 CLINICAL DATA:  Low back pain with bilateral leg weakness. History of  back surgery. EXAM: MRI LUMBAR SPINE WITHOUT CONTRAST TECHNIQUE: Multiplanar, multisequence MR imaging of the lumbar spine was performed. No intravenous contrast was administered. COMPARISON:  Lumbar spine MRI 03/07/2017.  CT 09/21/2021. FINDINGS: Segmentation: Transitional lumbosacral anatomy. In keeping with the numbering applied previously, there is a transitional, partially lumbarized S1 segment. Utilizing this numbering, the patient has been fused from L2 through the mid sacrum. Alignment: Stable mild convex right scoliosis. Fused anterolisthesis at L5-S1 measuring approximately 3 mm. Vertebrae: No worrisome osseous lesion, acute fracture or pars defect. Previous extensive multilevel posterior decompression and fusion with hardware extending from L2 through the mid sacrum. Sacroiliac degenerative changes are present bilaterally. Conus medullaris: Extends to the L1-2 level and appears normal. Paraspinal and other soft tissues: Nonspecific postsurgical fluid collection within the laminectomy beds at L3, L4 and L5, measuring 3.5 x 2.2 x 7.6 cm, likely chronic. No acute paraspinal findings. Atrophy of the erector spinae musculature. Disc levels: L1-2: As correlated with recent CT, advanced disc degeneration with vacuum phenomenon, annular bulging and endplate osteophytes. There is moderate facet and ligamentous hypertrophy, with a probable small synovial cyst within the ligamentum flavum on the left, measuring 7 mm on image 13/7. There is resulting moderate multifactorial spinal stenosis with mild lateral recess and foraminal narrowing bilaterally. The spinal canal and neural foramina appear decompressed from L2-3 through L5-S1. There is no evidence of spinal stenosis or nerve root encroachment at the operative levels. IMPRESSION: 1. No change is seen from the recent CT. 2. Extensive postsurgical changes from posterior decompression and fusion from L2 through the sacrum. The operative levels appear stable with a  probable chronic fluid collection in the laminectomy bed. 3. Adjacent segment disease at L1-2 with resulting moderate multifactorial spinal stenosis. Electronically Signed   By: Richardean Sale M.D.   On: 09/28/2021 13:48   DG C-Arm 1-60 Min-No Report  Result Date: 10/01/2021 Fluoroscopy was utilized by the requesting physician.  No radiographic interpretation.   DG C-Arm 1-60 Min-No Report  Result Date: 10/01/2021 Fluoroscopy was utilized by the requesting physician.  No radiographic interpretation.    Antibiotics:  Anti-infectives (From admission, onward)    Start     Dose/Rate Route Frequency Ordered Stop   10/01/21 2015  ceFAZolin (ANCEF) IVPB 2g/100 mL premix        2 g 200 mL/hr over 30 Minutes Intravenous Every 8 hours 10/01/21 1929 10/02/21 0419   10/01/21 1330  ceFAZolin (ANCEF) IVPB 2g/100 mL premix        2 g 200  mL/hr over 30 Minutes Intravenous On call to O.R. 10/01/21 1011 10/01/21 1402       Discharge Exam: Blood pressure 132/60, pulse 64, temperature 97.9 F (36.6 C), temperature source Oral, resp. rate 18, height 5\' 11"  (1.803 m), weight 74.8 kg, SpO2 95 %. Neurologic: Grossly normal Ambulating and voiding well incision cdi   Discharge Medications:   Allergies as of 10/02/2021       Reactions   Diphenhydramine Anxiety, Other (See Comments)   Agitation, Feeling jittery   Gabapentin Other (See Comments)   "Mental status/awareness changes"        Medication List     TAKE these medications    ALPRAZolam 0.5 MG tablet Commonly known as: XANAX Take 1 tablet (0.5 mg total) by mouth at bedtime as needed for anxiety. Take one or two before MRI   atorvastatin 10 MG tablet Commonly known as: LIPITOR Take 10 mg by mouth at bedtime.   FISH OIL PO Take 1 capsule by mouth daily.   HYDROcodone-acetaminophen 7.5-325 MG tablet Commonly known as: NORCO Take 1 tablet by mouth every 6 (six) hours.   meloxicam 15 MG tablet Commonly known as: MOBIC Take 15 mg  by mouth daily as needed for pain.   methocarbamol 500 MG tablet Commonly known as: Robaxin Take 1 tablet (500 mg total) by mouth 4 (four) times daily.   metoprolol tartrate 25 MG tablet Commonly known as: LOPRESSOR Take 0.5 tablets (12.5 mg total) by mouth 2 (two) times daily. What changed: when to take this   multivitamin tablet Take 1 tablet by mouth daily.   tolterodine 2 MG 24 hr capsule Commonly known as: Detrol LA Take 1 capsule (2 mg total) by mouth daily.               Durable Medical Equipment  (From admission, onward)           Start     Ordered   10/01/21 1930  DME Walker rolling  Once       Question Answer Comment  Walker: With 5 Inch Wheels   Patient needs a walker to treat with the following condition Gait disturbance      10/01/21 1929            Disposition: home   Final Dx: T11-T12 lami with posterior lateral fusion  Discharge Instructions      Remove dressing in 72 hours   Complete by: As directed    Call MD for:  difficulty breathing, headache or visual disturbances   Complete by: As directed    Call MD for:  hives   Complete by: As directed    Call MD for:  persistant dizziness or light-headedness   Complete by: As directed    Call MD for:  persistant nausea and vomiting   Complete by: As directed    Call MD for:  redness, tenderness, or signs of infection (pain, swelling, redness, odor or green/yellow discharge around incision site)   Complete by: As directed    Call MD for:  severe uncontrolled pain   Complete by: As directed    Call MD for:  temperature >100.4   Complete by: As directed    Diet - low sodium heart healthy   Complete by: As directed    Driving Restrictions   Complete by: As directed    No driving for 2 weeks, no riding in the car for 1 week   Increase activity slowly   Complete by: As directed  Lifting restrictions   Complete by: As directed    No lifting more than 8 lbs           Signed: Ocie Cornfield Jonavan Vanhorn 10/02/2021, 8:11 AM

## 2021-10-02 NOTE — Evaluation (Signed)
Physical Therapy Evaluation Patient Details Name: Glen [name] MRN: [id number] DOB: 1936-05-22 Today's Date: 10/02/2021  History of Present Illness  Pt is a 86 y.o. M who presents s/p lami T11-T12 10/01/2021. No significant PMH.  Clinical Impression  Pt admitted s/p procedure listed above. PTA, pt lives with his spouse, uses a Radiation protection practitioner for household ambulation and requires assist for some ADL's. Pt reporting good pain control post op and improved activity tolerance. Pt ambulating 200 feet with a walker at a min assist level; demonstrates BLE ataxia and decreased gross motor coordination. Education provided regarding back precautions, activity recommendations, appropriate DME, home/bathroom set up. Will benefit from follow up HHPT to address deficits.      Recommendations for follow up therapy are one component of a multi-disciplinary discharge planning process, led by the attending physician.  Recommendations may be updated based on patient status, additional functional criteria and insurance authorization.  Follow Up Recommendations Home health PT    Assistance Recommended at Discharge Frequent or constant Supervision/Assistance  Patient can return home with the following  A little help with walking and/or transfers;A little help with bathing/dressing/bathroom;Help with stairs or ramp for entrance    Equipment Recommendations None recommended by PT  Recommendations for Other Services       Functional Status Assessment Patient has had a recent decline in their functional status and demonstrates the ability to make significant improvements in function in a reasonable and predictable amount of time.     Precautions / Restrictions Precautions Precautions: Fall;Back Precaution Booklet Issued: Yes (comment) Restrictions Weight Bearing Restrictions: No      Mobility  Bed Mobility Overal bed mobility: Needs Assistance Bed Mobility: Rolling, Sidelying to Sit, Sit to  Sidelying Rolling: Supervision Sidelying to sit: Supervision     Sit to sidelying: Supervision General bed mobility comments: cues for log roll technique, HOB flat, use of rail    Transfers Overall transfer level: Needs assistance Equipment used: Rolling walker (2 wheels) Transfers: Sit to/from Stand Sit to Stand: Min guard           General transfer comment: cues for hand placement    Ambulation/Gait Ambulation/Gait assistance: Min assist Gait Distance (Feet): 200 Feet Assistive device: Rolling walker (2 wheels) Gait Pattern/deviations: Step-through pattern, Decreased dorsiflexion - right, Decreased dorsiflexion - left, Ataxic, Trunk flexed Gait velocity: decreased Gait velocity interpretation: <1.8 ft/sec, indicate of risk for recurrent falls   General Gait Details: Cues for upright posture, walker proximity, decreased R step length, glute activation. Pt with erratic step length overall and wider BOS, decreased heel strike at initial contact  Stairs            Wheelchair Mobility    Modified Rankin (Stroke Patients Only)       Balance Overall balance assessment: Needs assistance Sitting-balance support: Feet supported Sitting balance-Leahy Scale: Fair     Standing balance support: Bilateral upper extremity supported, Reliant on assistive device for balance Standing balance-Leahy Scale: Poor                               Pertinent Vitals/Pain Pain Assessment Pain Assessment: 0-10 Pain Score:  ("0.5") Pain Location: incisional Pain Descriptors / Indicators: Operative site guarding Pain Intervention(s): Monitored during session    Home Living Family/patient expects to be discharged to:: Private residence Living Arrangements: Spouse/significant other Available Help at Discharge: Family Type of Home: House Home Access: Stairs to enter Entrance Stairs-Rails: Right Entrance Stairs-Number  of Steps: 2   Home Layout: Able to live on main  level with bedroom/bathroom Home Equipment: [place] (2 wheels);Rollator (4 wheels);Wheelchair - manual;BSC/3in1      Prior Function Prior Level of Function : Needs assist             Mobility Comments: uses Rollator for household distances, w/c for longer distances ADLs Comments: pt requires assist for bathing, dressing intermittently     Hand Dominance        Extremity/Trunk Assessment   Upper Extremity Assessment Upper Extremity Assessment: Generalized weakness    Lower Extremity Assessment Lower Extremity Assessment: RLE deficits/detail;LLE deficits/detail RLE Coordination: decreased gross motor LLE Coordination: decreased gross motor    Cervical / Trunk Assessment Cervical / Trunk Assessment: Kyphotic;Back Surgery  Communication   Communication: HOH  Cognition Arousal/Alertness: Awake/alert Behavior During Therapy: WFL for tasks assessed/performed Overall Cognitive Status: Within Functional Limits for tasks assessed                                          General Comments      Exercises     Assessment/Plan    PT Assessment Patient needs continued PT services  PT Problem List Decreased strength;Decreased activity tolerance;Decreased balance;Decreased mobility;Decreased coordination       PT Treatment Interventions DME instruction;Gait training;Stair training;Functional mobility training;Therapeutic activities;Therapeutic exercise;Balance training;Patient/family education    PT Goals (Current goals can be found in the Care Plan section)  Acute Rehab PT Goals Patient Stated Goal: to walk longer distances with Rollator PT Goal Formulation: With patient Time For Goal Achievement: 10/16/21 Potential to Achieve Goals: Good    Frequency Min 5X/week     Co-evaluation               AM-PAC PT "6 Clicks" Mobility  Outcome Measure Help needed turning from your back to your side while in a flat bed without using bedrails?: A  Little Help needed moving from lying on your back to sitting on the side of a flat bed without using bedrails?: A Little Help needed moving to and from a bed to a chair (including a wheelchair)?: A Little Help needed standing up from a chair using your arms (e.g., wheelchair or bedside chair)?: A Little Help needed to walk in hospital room?: A Little Help needed climbing 3-5 steps with a railing? : A Lot 6 Click Score: 17    End of Session Equipment Utilized During Treatment: Gait belt Activity Tolerance: Patient tolerated treatment well Patient left: in bed;with call bell/phone within reach;with family/visitor present Nurse Communication: Mobility status PT Visit Diagnosis: Unsteadiness on feet (R26.81);Other abnormalities of gait and mobility (R26.89);Ataxic gait (R26.0)    Time: 8270-7867 PT Time Calculation (min) (ACUTE ONLY): 38 min   Charges:   PT Evaluation $PT Eval Low Complexity: 1 Low PT Treatments $Gait Training: 8-22 mins $Self Care/Home Management: 8-22        Wyona Almas, PT, DPT Acute Rehabilitation Services Pager [phone] Office [phone]   Deno Etienne 10/02/2021, 10:49 AM

## 2021-10-02 NOTE — Progress Notes (Signed)
Pt and daughter given D/C instructions with verbal understanding. Rx's were sent to the pharmacy by MD. Pt's incision is clean and dry with no sign of infection. Pt's IV was removed prior to D/C. Home Health was arranged per MD order. Pt D/C'd home via wheelchair per MD order. Pt is stable @ D/C and has no other needs at this time. Holli Humbles, RN

## 2021-10-02 NOTE — Anesthesia Postprocedure Evaluation (Signed)
Anesthesia Post Note  Patient: Glen [name]  Procedure(s) Performed: T11-12 Decompression with possible instrumented fusion (Back)     Patient location during evaluation: PACU Anesthesia Type: General Level of consciousness: awake and alert Pain management: pain level controlled Vital Signs Assessment: post-procedure vital signs reviewed and stable Respiratory status: spontaneous breathing, nonlabored ventilation, respiratory function stable and patient connected to nasal cannula oxygen Cardiovascular status: stable and blood pressure returned to baseline Anesthetic complications: no   No notable events documented.  Last Vitals:  Vitals:   10/02/21 0021 10/02/21 0353  BP: (!) 165/82 132/60  Pulse: 80 64  Resp: 18 18  Temp: 36.8 C 36.6 C  SpO2: 92% 95%    Last Pain:  Vitals:   10/02/21 0535  TempSrc:   PainSc: Kenly Shoshanna Mcquitty

## 2021-10-02 NOTE — TOC Initial Note (Addendum)
Transition of Care [facility]) - Initial/Assessment Note    Patient Details  Name: Glen [name] MRN: [id number] Date of Birth: 11-04-1935  Transition of Care [facility]) CM/SW Contact:    Marilu Favre, RN Phone Number: 10/02/2021, 10:17 AM  Clinical Narrative:                  Spoke to patient and family at bedside. They prefer [facility]. NCM called Terri at [facility] she requested referral to be faxed to her at [facility] [phone] . Same done. Karna Christmas will check to see if they are in network if so she can accept referral. Await determination. Patient and family aware asking for a time frame, Terri unable to provide one but aware patient is ready for discharge.   30 Tammy still has not heard back regarding insurance.   17 Tammy with [facility] returned call, they can accept referral however she is requesting the home health order to state [facility] for start of care and PT. Patient has already left the hospital , nurse will ask NP for order   [place] order. If they need anything else they will call Dr Ronnald Ramp' office. Expected Discharge Plan: [place] Barriers to Discharge: No Barriers Identified   Patient Goals and CMS Choice Patient states their goals for this hospitalization and ongoing recovery are:: to go home CMS Medicare.gov Compare Post Acute Care list provided to:: Patient Choice offered to / list presented to : Patient  Expected Discharge Plan and Services Expected Discharge Plan: [place]   Discharge Planning Services: CM Consult Post Acute Care Choice: [place] arrangements for the past 2 months: Single Family Home Expected Discharge Date: 10/02/21               DME Arranged: N/A         HH Arranged: PT HH Agency: [facility] Date [place]: 10/02/21 Time [place]: 1016 Representative spoke with at [place]: [place] Arrangements/Services Living  arrangements for the past 2 months: [place] with:: Spouse Patient language and need for interpreter reviewed:: Yes Do you feel safe going back to the place where you live?: Yes      Need for Family Participation in Patient Care: Yes (Comment) Care giver support system in place?: Yes (comment)   Criminal Activity/Legal Involvement Pertinent to Current Situation/Hospitalization: No - Comment as needed  Activities of Daily Living Home Assistive Devices/Equipment: Eyeglasses, Hearing aid, Cane (specify quad or straight), Blood pressure cuff, Walker (specify type), Grab bars in shower, Hand-held shower hose, Shower chair without back ADL Screening (condition at time of admission) Patient's cognitive ability adequate to safely complete daily activities?: Yes Is the patient deaf or have difficulty hearing?: No Does the patient have difficulty seeing, even when wearing glasses/contacts?: No Does the patient have difficulty concentrating, remembering, or making decisions?: No Patient able to express need for assistance with ADLs?: Yes Does the patient have difficulty dressing or bathing?: No Independently performs ADLs?: Yes (appropriate for developmental age) Does the patient have difficulty walking or climbing stairs?: Yes Weakness of Legs: Both Weakness of Arms/Hands: None  Permission Sought/Granted   Permission granted to share information with : Yes, Verbal Permission Granted              Emotional Assessment Appearance:: Appears stated age Attitude/Demeanor/Rapport: Engaged Affect (typically observed): Accepting Orientation: : Oriented to Self, Oriented to Place, Oriented  to  Time, Oriented to Situation Alcohol / Substance Use: Not Applicable Psych Involvement: No (comment)  Admission diagnosis:  Status post thoracic spinal fusion [Z98.1] Patient Active Problem List   Diagnosis Date Noted   Status post thoracic spinal fusion 10/01/2021   PAF (paroxysmal atrial  fibrillation) (HCC)    S/P lumbar fusion    Spondylolisthesis 05/27/2017   Hx of adenomatous colonic polyps 04/23/2016   PCP:  Kennieth Rad, MD Pharmacy:   [place] [place], [place] [address] Phone: [phone] Fax: [phone]     Social Determinants of Health (SDOH) Interventions    Readmission Risk Interventions No flowsheet data found.

## 2021-10-15 ENCOUNTER — Ambulatory Visit: Payer: Medicare PPO | Admitting: Neurology

## 2021-10-16 ENCOUNTER — Ambulatory Visit: Payer: Medicare PPO | Admitting: Neurology

## 2021-11-27 ENCOUNTER — Encounter: Payer: Self-pay | Admitting: Neurology

## 2021-11-27 ENCOUNTER — Ambulatory Visit: Payer: Medicare PPO | Admitting: Neurology

## 2021-11-27 VITALS — BP 151/85 | HR 64 | Ht 71.0 in | Wt 163.0 lb

## 2021-11-27 DIAGNOSIS — M5124 Other intervertebral disc displacement, thoracic region: Secondary | ICD-10-CM | POA: Diagnosis not present

## 2021-11-27 DIAGNOSIS — M4714 Other spondylosis with myelopathy, thoracic region: Secondary | ICD-10-CM | POA: Diagnosis not present

## 2021-11-27 DIAGNOSIS — R3915 Urgency of urination: Secondary | ICD-10-CM | POA: Diagnosis not present

## 2021-11-27 DIAGNOSIS — R269 Unspecified abnormalities of gait and mobility: Secondary | ICD-10-CM | POA: Diagnosis not present

## 2021-11-27 DIAGNOSIS — R252 Cramp and spasm: Secondary | ICD-10-CM

## 2021-11-27 DIAGNOSIS — R200 Anesthesia of skin: Secondary | ICD-10-CM

## 2021-11-27 MED ORDER — BACLOFEN 5 MG PO TABS: ORAL_TABLET | ORAL | 5 refills | Status: DC

## 2021-11-27 NOTE — Patient Instructions (Signed)
Baclofen 5 mg tablets ?Take 1 tablet 3 times a day. ?If this is well-tolerated but is not helping, you can go up to 2 pills 3 times a day (6 pills a day total) ? ?Let us know after 3 to 4 weeks if you note no improvement at all ? ?If this is not covered well by your insurance, let us know and I will send in the 10 mg tablets that are cheaper ?

## 2021-11-27 NOTE — Progress Notes (Signed)
? ?[facility] ? ?PATIENT: Glen [name] ?DOB: 1935-10-23 ? ?REFERRING DOCTOR OR PCP:  Kennieth Rad, MD ?SOURCE: Patient, notes from primary care, imaging reports, laboratory reports, CT scan images personally reviewed. ? ?_________________________________ ? ? ?HISTORICAL ? ?CHIEF COMPLAINT:  ?Chief Complaint  ?Patient presents with  ? Follow-up  ?  Rm 1, w daughter. Pt here to f/u from last OV on 09/03/21 and referred for Botox treatment for spacticity.   ? ? ?HISTORY OF PRESENT ILLNESS:  ?Glen [name] is a 86 y.o. man with gait difficulty ? ?Update 11/27/2021: ?At the last visit, due to his gait disturbance and hyperreflexia, MRI of the brain, cervical and thoracic spine was performed.  The MRI of the thoracic spine showed moderately severe spinal stenosis at T11-T12 and edema at the endplates.  MRI of the cervical spine showed mild spinal stenosis at C5-C6 and significant degenerative changes at C4-C5 but nothing that would affect the spinal cord.  MRI of the brain showed age-related changes.   ? ?Degenerative changes at T11-T12 had markedly progressed compared to imaging from 2019.  He was referred to Dr. Ronnald Ramp of neurosurgery.   He had spinal surgery 10/01/2021 and recovered well.      He is walking up to a mile at a time and is noting more strength in his legs.    He is working wit PT.   He was having some difficulty with bladder urgency and bowel incontinence but has no further episodes.   Tolteridine helped  ? ?He currently continues to have spasticity of his gait.  He was referred back to [place] for consideration of Botox therapy.  I discussed this with him and his daughter.  I personally have not had patients get much benefit from Botox f in the legs for ambulatory patients.  Regardless I would want him to try baclofen and/or tizanidine first. ? ?History from initial consultation: ?He is an 86 year old man who has noted weakness about 3-4 weeks ago.   Along with the weakness, he also notes  gai is slower.   He was waling 3 miles daily.   Over the last few months, he would get a sensation in both legs that would occur with standing or walking but not if he is sitting.   He went from walking well to a cane to a walker over the last few weeks.    The right leg is weaker than his left.  He has numbness on both feet  He had noted mild toe numbness x several years but the leg dysesthesia is new.   He denies any numbnes, pain or weakness in his arms.   He is needing to urina more frequently and he has had a little incontinence over the last month ? ?He has a h/o spinal issues.   He had right sciatica type pain.   Prompting surgery in 2018.  He had PLIF L2-S2 in 2018 ?CT 05/08/2017 showed mild spinal stenosis at L2L3, L3L4, L4L5 and L5S1 but significant foraminal narrowing at ost of these levels.   He had grade 1 anterolisthesis at L5S1.   In T spine he had mild spinal stenosis associated with linear calcifications at T8T9.     Lumbar xrays 05/27/2017 show PLIF from L2-S2.   ?ain.. ? ?Imaging ?MRI of the brain 09/15/2021 shows mild to moderate generalized cortical atrophy with the ventricles proportionate to the extent of atrophy.  The brain parenchyma was normal for age.   ? ?MRI of the cervical spine  09/15/2021 showed normal spinal cord.  There were degenerative changes that could affect the right C5 nerve root at C4-C5 and mild spinal stenosis at C5-C6 but no significant spinal stenosis. ? ?MRI of the thoracic spine 09/15/2021 showed myelopathic signal within the spinal cord at T11-T12.  At that level there is moderately severe spinal stenosis due to disc protrusion, endplate spurring and facet hypertrophy.  There was edema at the endplates.  At T8-T9, there were degenerative changes corresponding to the calcifications noted on the CT scan from 2018 ? ?Labs 05/30/2021:  TSH, T4 hemoglobin, white blood cell were normal. ?Cholesterol was 172 and HDL was 101 ?Creatinine was elevated at 1.34.  Hemoglobin A1c  borderline at 5 9, glucose 109 ? ?B12 3554 [phone]); folate elevated at 30.8 (3.1-17.5) ? ? ?REVIEW OF SYSTEMS: ?Constitutional: No fevers, chills, sweats, or change in appetite ?Eyes: No visual changes, double vision, eye pain ?Ear, nose and throat: No hearing loss, ear pain, nasal congestion, sore throat ?Cardiovascular: No chest pain, palpitations ?Respiratory:  No shortness of breath at rest or with exertion.   No wheezes ?GastrointestinaI: No nausea, vomiting, diarrhea, abdominal pain, fecal incontinence ?Genitourinary:  No dysuria, urinary retention or frequency.  No nocturia. ?Musculoskeletal:  No neck pain, back pain ?Integumentary: No rash, pruritus, skin lesions ?Neurological: as above ?Psychiatric: No depression at this time.  No anxiety ?Endocrine: No palpitations, diaphoresis, change in appetite, change in weigh or increased thirst ?Hematologic/Lymphatic:  No anemia, purpura, petechiae. ?Allergic/Immunologic: No itchy/runny eyes, nasal congestion, recent allergic reactions, rashes ? ?ALLERGIES: ?Allergies  ?Allergen Reactions  ? Diphenhydramine Anxiety and Other (See Comments)  ?  Agitation, Feeling jittery  ? Gabapentin Other (See Comments)  ?  "Mental status/awareness changes"  ? ? ?HOME MEDICATIONS: ? ?Current Outpatient Medications:  ?  atorvastatin (LIPITOR) 10 MG tablet, Take 10 mg by mouth at bedtime., Disp: , Rfl:  ?  baclofen 5 MG TABS, One po tid, Disp: 90 tablet, Rfl: 5 ?  meloxicam (MOBIC) 15 MG tablet, Take 15 mg by mouth daily as needed for pain., Disp: , Rfl:  ?  metoprolol tartrate (LOPRESSOR) 25 MG tablet, Take 0.5 tablets (12.5 mg total) by mouth 2 (two) times daily. (Patient taking differently: Take 12.5 mg by mouth at bedtime.), Disp: 60 tablet, Rfl: 2 ?  Multiple Vitamin (MULTIVITAMIN) tablet, Take 1 tablet by mouth daily., Disp: , Rfl:  ?  Omega-3 Fatty Acids (FISH OIL PO), Take 1 capsule by mouth daily., Disp: , Rfl:  ?  tolterodine (DETROL LA) 2 MG 24 hr capsule, Take 1 capsule  (2 mg total) by mouth daily., Disp: 30 capsule, Rfl: 5 ? ?PAST MEDICAL HISTORY: ?Past Medical History:  ?Diagnosis Date  ? Allergy   ? Arthritis   ? Cataracts, bilateral   ? bil cataracts removed  ? DDD (degenerative disc disease), lumbosacral   ? pain radiates down left leg  ? Environmental and seasonal allergies   ? GERD (gastroesophageal reflux disease)   ? Hearing loss   ? bilateral hearing aids  ? Heart murmur   ? recently  saw cardiologist and said it was fine   ? Hx of adenomatous colonic polyps 04/23/2016  ? Hyperlipidemia   ? Wound dehiscence 05/31/2017  ? ? ?PAST SURGICAL HISTORY: ?Past Surgical History:  ?Procedure Laterality Date  ? APPLICATION OF ROBOTIC ASSISTANCE FOR SPINAL PROCEDURE N/A 05/27/2017  ? Procedure: APPLICATION OF ROBOTIC ASSISTANCE FOR SPINAL PROCEDURE;  Surgeon: Ditty, Kevan Ny, MD;  Location: [facility];  Service: Neurosurgery;  Laterality: N/A;  ? cataract    ? bilat  ? CIRCUMCISION    ? COLONOSCOPY    ? COLONOSCOPY W/ POLYPECTOMY    ? LAMINECTOMY  05/27/2017  ? SACRAL  ? LUMBAR WOUND DEBRIDEMENT N/A 05/31/2017  ? Procedure: LUMBAR WOUND REVISION;  Surgeon: Eustace Moore, MD;  Location: [facility];  Service: Neurosurgery;  Laterality: N/A;  ? THORACIC DISCECTOMY N/A 10/01/2021  ? Procedure: T11-12 Decompression with possible instrumented fusion;  Surgeon: Eustace Moore, MD;  Location: [facility];  Service: Neurosurgery;  Laterality: N/A;  ? TONSILLECTOMY    ? wisdom teth extraction    ? ? ?FAMILY HISTORY: ?Family History  ?Problem Relation Age of Onset  ? Alzheimer's disease Father   ? Stroke Brother   ? Colon cancer Neg Hx   ? Esophageal cancer Neg Hx   ? Rectal cancer Neg Hx   ? Stomach cancer Neg Hx   ? Pancreatic cancer Neg Hx   ? Prostate cancer Neg Hx   ? ? ?SOCIAL HISTORY: ? ?Social History  ? ?Socioeconomic History  ? Marital status: Married  ?  Spouse name: Vaughan Basta  ? Number of children: 2  ? Years of education: Not on file  ? Highest education level: Professional school degree (e.g.,  MD, DDS, DVM, JD)  ?Occupational History  ? Not on file  ?Tobacco Use  ? Smoking status: Former  ?  Packs/day: 1.00  ?  Types: Cigarettes  ?  Quit date: 08/02/2016  ?  Years since quitting: 5.3  ? Smokeless t

## 2021-12-11 ENCOUNTER — Telehealth: Payer: Self-pay | Admitting: Neurology

## 2021-12-11 NOTE — Telephone Encounter (Signed)
Called the pt back. He started the baclofen 5 mg after the visit on 11/27/21. He states prior to starting this he was progressing well. He could walk up to a mile and he states since starting the medication he has notice there has been decline in progression status.  ?I advised that he is on a low dose and not likely that it would be causing the problems he is having but he did mention that it made him somewhat drowsy when he would take it. I advised that being drowsy from the medication could play a role in him not feeling as strong or able to walk. Pt states the last time he took a dose was 4/23 in the morning. Advised for now to continue to hold the medication for the rest of the week and determine if he starts noting that improvement again. He has another session with PT coming up at the end of the week. I advised that he should discuss with the PT since they have consistently been working with him to determine if they have notice these changes or decline. Pt was agreeable to this plan and will keep [place] updated on whether he feels he needs to restart. ?

## 2021-12-11 NOTE — Telephone Encounter (Signed)
Pt said since taking baclofen 5 MG TABS is not able to walk as far, had fall while walking. Stop taking Baclofen yesterday.Would like a call from the nurse discuss another medication. ?

## 2021-12-13 ENCOUNTER — Telehealth: Payer: Self-pay | Admitting: Neurology

## 2021-12-13 NOTE — Telephone Encounter (Signed)
Pt called stating that since he started the baclofen 5 MG TABS he has gone back to the walker, has had a fall, feels weaker with no strength at all and his back is hurting walking around the house. Pt would like to discuss.  ?

## 2021-12-13 NOTE — Telephone Encounter (Signed)
Called the patient back as this message is similar to the message I had received and already spoke with the patient about on Tuesday 4/25.  ?The patient states that he worked with the therapist today and had to use his walker. The patient has been off the baclofen since Sunday so I do not feel that these worsening symptoms are related to the medication. Advised that my concern is that there could be some complications or symptoms worsening from his previous surgery. I asked had he contacted his neuro surgeon, the patient states he has put a call into their office and is waiting to hear back. I advised that they want to complete imaging and further evaluate to make sure there is nothing needed from a surgical standpoint. The patient has also noted that the numbness in feet bilaterally have increased.  ?I advised I will bring this information to Dr Felecia Shelling to see if he agrees with waiting to see what the NS recommends or if he has anything additionally to add.  ? ? ?From another phone note started on 12/13/2021 ?"Pt called stating that since he started the baclofen 5 MG TABS he has gone back to the walker, has had a fall, feels weaker with no strength at all and his back is hurting walking around the house. Pt would like to discuss. " ?

## 2021-12-13 NOTE — Telephone Encounter (Signed)
Called the patient back. Advised I discussed with Dr Felecia Shelling and he agreed he should remain off the baclofen and he has been off since Sunday. Advised that he agrees that he should wait to hear from the NS and ensure there is nothing else that could be causing this decline. Advised the pt if they don't do anything and the symptoms worsen he can reach back out to [place] next week and we will see if he can get worked in and if we need to order imaging. Pt verbalized understanding. ?He will await to discuss with Dr Ronnald Ramp team.  ?

## 2021-12-31 ENCOUNTER — Telehealth: Payer: Self-pay | Admitting: Neurology

## 2021-12-31 NOTE — Telephone Encounter (Signed)
Called pt to further discuss. He reports he has follow up 01/15/22 with Dr. Tenee Wish/Neurosurgery.  ?Had surgery 10/01/21 with Dr. Ronnald Ramp. Continues to have balance/walking issues. No longer takes baclofen. Worried about worsening swelling in feet in the last month. No further falls since the one fall he had in April.  ? ?Has tried calling Dr. Ronnald Ramp office to get sooner appt x2 but no one has returned his call. Aware I will try and reach out and will call him back. Their phone: [phone]. I called and spoke w/ operator. She transferred me to [facility] (assistant for Dr. Ronnald Ramp). LVM for her to call back to discuss getting him in sooner there for an appt. ?

## 2021-12-31 NOTE — Telephone Encounter (Signed)
Pt states he has an appointment with Dr Ronnald Ramp on 05-30 and he'd like to know what information Dr Felecia Shelling would like for him to get ?

## 2022-01-01 ENCOUNTER — Ambulatory Visit: Payer: Medicare PPO | Admitting: Neurology

## 2022-01-01 ENCOUNTER — Encounter: Payer: Self-pay | Admitting: Neurology

## 2022-01-01 VITALS — BP 154/69 | HR 63 | Ht 71.0 in | Wt 167.5 lb

## 2022-01-01 DIAGNOSIS — R269 Unspecified abnormalities of gait and mobility: Secondary | ICD-10-CM

## 2022-01-01 DIAGNOSIS — R3915 Urgency of urination: Secondary | ICD-10-CM | POA: Diagnosis not present

## 2022-01-01 DIAGNOSIS — M4714 Other spondylosis with myelopathy, thoracic region: Secondary | ICD-10-CM | POA: Diagnosis not present

## 2022-01-01 DIAGNOSIS — R252 Cramp and spasm: Secondary | ICD-10-CM

## 2022-01-01 DIAGNOSIS — R200 Anesthesia of skin: Secondary | ICD-10-CM | POA: Diagnosis not present

## 2022-01-01 NOTE — Progress Notes (Signed)
? ?[facility] ? ?PATIENT: Glen [name] ?DOB: [birth year] ? ?REFERRING DOCTOR OR PCP:  Kennieth Rad, MD ?SOURCE: Patient, notes from primary care, imaging reports, laboratory reports, CT scan images personally reviewed. ? ?_________________________________ ? ? ?HISTORICAL ? ?CHIEF COMPLAINT:  ?Chief Complaint  ?Patient presents with  ? Follow-up  ?  RM 1, with wife. Does not have appt with neurosurgery until 01/15/22. Having ongoing issues with walking.   ? ? ?HISTORY OF PRESENT ILLNESS:  ?Glen [name] is a 86 y.o. man with gait difficulty ? ?Update 01/01/2022: ?He has done better compared to before the surgery but not to his baseline.   He has had a couple falls.   He had 2 falls while walking after 1/2 mile and he noted the legs giving out and not being able to get to a stable spot to hold onto.    ? ?At the last visit, I had added baclofen to see if that would help the gait spasticity.  He does not think it helped the gait much.  Of note, the event where he fell twice occurred after he started the baclofen. ? ?Because of the degenerative changes and spinal stenosis at T11-T12 had markedly progressed compared to imaging from 2019, he was referred to Dr. Ronnald Ramp of neurosurgery.   He had spinal surgery 10/01/2021 and had some improvement of his gait..     On a good day he is walking up to a mile at a time and is noting more strength in his legs.    He is working wit PT.    ? ?Bladder function is better on Detrol LA with less urgency ? ?History from initial consultation: ?He is an 86 year old man who has noted weakness January 2023.   Along with the weakness, he also notes gait was slower.   He was waling 3 miles daily in 2022.   Starting December 2022, he would get a sensation in both legs that would occur with standing or walking but not if he is sitting.   He went from walking well to a cane to a walker over the last few weeks.    The right leg is weaker than his left.  He has numbness on both  feet  He had noted mild toe numbness x several years but the leg dysesthesia is new.   He denies any numbnes, pain or weakness in his arms.   He is needing to urina more frequently and he has had a little incontinence over the last month ? ?He has a h/o spinal issues.   He had right sciatica type pain.   Prompting surgery in 2018.  He had PLIF L2-S2 in 2018 ?CT 05/08/2017 showed mild spinal stenosis at L2L3, L3L4, L4L5 and L5S1 but significant foraminal narrowing at ost of these levels.   He had grade 1 anterolisthesis at L5S1.   In T spine he had mild spinal stenosis associated with linear calcifications at T8T9.     Lumbar xrays 05/27/2017 show PLIF from L2-S2.   ? ?MRIs 09/15/2021 showed significant progressive degenerative changes at T11-T12 with myelopathic signal within the spinal cord.  He was referred to Dr. Ronnald Ramp who did surgery in February 2023 ? ?Imaging ?MRI of the brain 09/15/2021 shows mild to moderate generalized cortical atrophy with the ventricles proportionate to the extent of atrophy.  The brain parenchyma was normal for age.   ? ?MRI of the cervical spine 09/15/2021 showed normal spinal cord.  There were degenerative changes that could  affect the right C5 nerve root at C4-C5 and mild spinal stenosis at C5-C6 but no significant spinal stenosis. ? ?MRI of the thoracic spine 09/15/2021 showed myelopathic signal within the spinal cord at T11-T12.  At that level there is moderately severe spinal stenosis due to disc protrusion, endplate spurring and facet hypertrophy.  There was edema at the endplates.  At T8-T9, there were degenerative changes corresponding to the calcifications noted on the CT scan from 2018 ? ?Labs 05/30/2021:  TSH, T4 hemoglobin, white blood cell were normal. ?Cholesterol was 172 and HDL was 101 ?Creatinine was elevated at 1.34.  Hemoglobin A1c borderline at 5 9, glucose 109 ? ?B12 3554 [phone]); folate elevated at 30.8 (3.1-17.5) ? ? ?REVIEW OF SYSTEMS: ?Constitutional: No fevers,  chills, sweats, or change in appetite ?Eyes: No visual changes, double vision, eye pain ?Ear, nose and throat: No hearing loss, ear pain, nasal congestion, sore throat ?Cardiovascular: No chest pain, palpitations ?Respiratory:  No shortness of breath at rest or with exertion.   No wheezes ?GastrointestinaI: No nausea, vomiting, diarrhea, abdominal pain, fecal incontinence ?Genitourinary:  No dysuria, urinary retention or frequency.  No nocturia. ?Musculoskeletal:  No neck pain, back pain ?Integumentary: No rash, pruritus, skin lesions ?Neurological: as above ?Psychiatric: No depression at this time.  No anxiety ?Endocrine: No palpitations, diaphoresis, change in appetite, change in weigh or increased thirst ?Hematologic/Lymphatic:  No anemia, purpura, petechiae. ?Allergic/Immunologic: No itchy/runny eyes, nasal congestion, recent allergic reactions, rashes ? ?ALLERGIES: ?Allergies  ?Allergen Reactions  ? Diphenhydramine Anxiety and Other (See Comments)  ?  Agitation, Feeling jittery  ? Gabapentin Other (See Comments)  ?  "Mental status/awareness changes"  ? ? ?HOME MEDICATIONS: ? ?Current Outpatient Medications:  ?  atorvastatin (LIPITOR) 10 MG tablet, Take 10 mg by mouth at bedtime., Disp: , Rfl:  ?  meloxicam (MOBIC) 15 MG tablet, Take 15 mg by mouth daily as needed for pain., Disp: , Rfl:  ?  metoprolol tartrate (LOPRESSOR) 25 MG tablet, Take 0.5 tablets (12.5 mg total) by mouth 2 (two) times daily. (Patient taking differently: Take 12.5 mg by mouth at bedtime.), Disp: 60 tablet, Rfl: 2 ?  Multiple Vitamin (MULTIVITAMIN) tablet, Take 1 tablet by mouth daily., Disp: , Rfl:  ?  Omega-3 Fatty Acids (FISH OIL PO), Take 1 capsule by mouth daily., Disp: , Rfl:  ?  tolterodine (DETROL LA) 2 MG 24 hr capsule, Take 1 capsule (2 mg total) by mouth daily., Disp: 30 capsule, Rfl: 5 ? ?PAST MEDICAL HISTORY: ?Past Medical History:  ?Diagnosis Date  ? Allergy   ? Arthritis   ? Cataracts, bilateral   ? bil cataracts removed  ?  DDD (degenerative disc disease), lumbosacral   ? pain radiates down left leg  ? Environmental and seasonal allergies   ? GERD (gastroesophageal reflux disease)   ? Hearing loss   ? bilateral hearing aids  ? Heart murmur   ? recently  saw cardiologist and said it was fine   ? Hx of adenomatous colonic polyps 04/23/2016  ? Hyperlipidemia   ? Wound dehiscence 05/31/2017  ? ? ?PAST SURGICAL HISTORY: ?Past Surgical History:  ?Procedure Laterality Date  ? APPLICATION OF ROBOTIC ASSISTANCE FOR SPINAL PROCEDURE N/A 05/27/2017  ? Procedure: APPLICATION OF ROBOTIC ASSISTANCE FOR SPINAL PROCEDURE;  Surgeon: Ditty, Kevan Ny, MD;  Location: [facility];  Service: Neurosurgery;  Laterality: N/A;  ? cataract    ? bilat  ? CIRCUMCISION    ? COLONOSCOPY    ? COLONOSCOPY W/ POLYPECTOMY    ?  LAMINECTOMY  05/27/2017  ? SACRAL  ? LUMBAR WOUND DEBRIDEMENT N/A 05/31/2017  ? Procedure: LUMBAR WOUND REVISION;  Surgeon: Eustace Moore, MD;  Location: [facility];  Service: Neurosurgery;  Laterality: N/A;  ? THORACIC DISCECTOMY N/A 10/01/2021  ? Procedure: T11-12 Decompression with possible instrumented fusion;  Surgeon: Eustace Moore, MD;  Location: [facility];  Service: Neurosurgery;  Laterality: N/A;  ? TONSILLECTOMY    ? wisdom teth extraction    ? ? ?FAMILY HISTORY: ?Family History  ?Problem Relation Age of Onset  ? Alzheimer's disease Father   ? Stroke Brother   ? Colon cancer Neg Hx   ? Esophageal cancer Neg Hx   ? Rectal cancer Neg Hx   ? Stomach cancer Neg Hx   ? Pancreatic cancer Neg Hx   ? Prostate cancer Neg Hx   ? ? ?SOCIAL HISTORY: ? ?Social History  ? ?Socioeconomic History  ? Marital status: Married  ?  Spouse name: Vaughan Basta  ? Number of children: 2  ? Years of education: Not on file  ? Highest education level: Professional school degree (e.g., MD, DDS, DVM, JD)  ?Occupational History  ? Not on file  ?Tobacco Use  ? Smoking status: Former  ?  Packs/day: 1.00  ?  Types: Cigarettes  ?  Quit date: 08/02/2016  ?  Years since quitting: 5.4  ?  Smokeless tobacco: Never  ?Vaping Use  ? Vaping Use: Never used  ?Substance and Sexual Activity  ? Alcohol use: Yes  ?  Alcohol/week: 7.0 standard drinks  ?  Types: 7 Glasses of wine per week  ? Drug use: No

## 2022-01-01 NOTE — Progress Notes (Signed)
? ?[facility] ? ?PATIENT: Glen [name] ?DOB: [birth year] ? ?REFERRING DOCTOR OR PCP:  Kennieth Rad, MD ?SOURCE: Patient, notes from primary care, imaging reports, laboratory reports, CT scan images personally reviewed. ? ?_________________________________ ? ? ?HISTORICAL ? ?CHIEF COMPLAINT:  ?Chief Complaint  ?Patient presents with  ? Follow-up  ?  RM 1, with wife. Does not have appt with neurosurgery until 01/15/22. Having ongoing issues with walking.   ? ? ?HISTORY OF PRESENT ILLNESS:  ?Glen [name] is a 86 y.o. man with gait difficulty ? ?Update 11/27/2021: ?At the last visit, due to his gait disturbance and hyperreflexia, MRI of the brain, cervical and thoracic spine was performed.  The MRI of the thoracic spine showed moderately severe spinal stenosis at T11-T12 and edema at the endplates.  MRI of the cervical spine showed mild spinal stenosis at C5-C6 and significant degenerative changes at C4-C5 but nothing that would affect the spinal cord.  MRI of the brain showed age-related changes.   ? ?Degenerative changes at T11-T12 had markedly progressed compared to imaging from 2019.  He was referred to Dr. Ronnald Ramp of neurosurgery.   He had spinal surgery 10/01/2021 and recovered well.      He is walking up to a mile at a time and is noting more strength in his legs.    He is working wit PT.   He was having some difficulty with bladder urgency and bowel incontinence but has no further episodes.   Tolteridine helped  ? ?He currently continues to have spasticity of his gait.  He was referred back to [place] for consideration of Botox therapy.  I discussed this with him and his daughter.  I personally have not had patients get much benefit from Botox f in the legs for ambulatory patients.  Regardless I would want him to try baclofen and/or tizanidine first. ? ?History from initial consultation: ?He is an 86 year old man who has noted weakness about 3-4 weeks ago.   Along with the weakness, he also notes  gai is slower.   He was waling 3 miles daily.   Over the last few months, he would get a sensation in both legs that would occur with standing or walking but not if he is sitting.   He went from walking well to a cane to a walker over the last few weeks.    The right leg is weaker than his left.  He has numbness on both feet  He had noted mild toe numbness x several years but the leg dysesthesia is new.   He denies any numbnes, pain or weakness in his arms.   He is needing to urina more frequently and he has had a little incontinence over the last month ? ?He has a h/o spinal issues.   He had right sciatica type pain.   Prompting surgery in 2018.  He had PLIF L2-S2 in 2018 ?CT 05/08/2017 showed mild spinal stenosis at L2L3, L3L4, L4L5 and L5S1 but significant foraminal narrowing at ost of these levels.   He had grade 1 anterolisthesis at L5S1.   In T spine he had mild spinal stenosis associated with linear calcifications at T8T9.     Lumbar xrays 05/27/2017 show PLIF from L2-S2.   ?ain.. ? ?Imaging ?MRI of the brain 09/15/2021 shows mild to moderate generalized cortical atrophy with the ventricles proportionate to the extent of atrophy.  The brain parenchyma was normal for age.   ? ?MRI of the cervical spine 09/15/2021 showed normal  spinal cord.  There were degenerative changes that could affect the right C5 nerve root at C4-C5 and mild spinal stenosis at C5-C6 but no significant spinal stenosis. ? ?MRI of the thoracic spine 09/15/2021 showed myelopathic signal within the spinal cord at T11-T12.  At that level there is moderately severe spinal stenosis due to disc protrusion, endplate spurring and facet hypertrophy.  There was edema at the endplates.  At T8-T9, there were degenerative changes corresponding to the calcifications noted on the CT scan from 2018 ? ?Labs 05/30/2021:  TSH, T4 hemoglobin, white blood cell were normal. ?Cholesterol was 172 and HDL was 101 ?Creatinine was elevated at 1.34.  Hemoglobin A1c  borderline at 5 9, glucose 109 ? ?B12 3554 [phone]); folate elevated at 30.8 (3.1-17.5) ? ? ?REVIEW OF SYSTEMS: ?Constitutional: No fevers, chills, sweats, or change in appetite ?Eyes: No visual changes, double vision, eye pain ?Ear, nose and throat: No hearing loss, ear pain, nasal congestion, sore throat ?Cardiovascular: No chest pain, palpitations ?Respiratory:  No shortness of breath at rest or with exertion.   No wheezes ?GastrointestinaI: No nausea, vomiting, diarrhea, abdominal pain, fecal incontinence ?Genitourinary:  No dysuria, urinary retention or frequency.  No nocturia. ?Musculoskeletal:  No neck pain, back pain ?Integumentary: No rash, pruritus, skin lesions ?Neurological: as above ?Psychiatric: No depression at this time.  No anxiety ?Endocrine: No palpitations, diaphoresis, change in appetite, change in weigh or increased thirst ?Hematologic/Lymphatic:  No anemia, purpura, petechiae. ?Allergic/Immunologic: No itchy/runny eyes, nasal congestion, recent allergic reactions, rashes ? ?ALLERGIES: ?Allergies  ?Allergen Reactions  ? Diphenhydramine Anxiety and Other (See Comments)  ?  Agitation, Feeling jittery  ? Gabapentin Other (See Comments)  ?  "Mental status/awareness changes"  ? ? ?HOME MEDICATIONS: ? ?Current Outpatient Medications:  ?  atorvastatin (LIPITOR) 10 MG tablet, Take 10 mg by mouth at bedtime., Disp: , Rfl:  ?  meloxicam (MOBIC) 15 MG tablet, Take 15 mg by mouth daily as needed for pain., Disp: , Rfl:  ?  metoprolol tartrate (LOPRESSOR) 25 MG tablet, Take 0.5 tablets (12.5 mg total) by mouth 2 (two) times daily. (Patient taking differently: Take 12.5 mg by mouth at bedtime.), Disp: 60 tablet, Rfl: 2 ?  Multiple Vitamin (MULTIVITAMIN) tablet, Take 1 tablet by mouth daily., Disp: , Rfl:  ?  Omega-3 Fatty Acids (FISH OIL PO), Take 1 capsule by mouth daily., Disp: , Rfl:  ?  tolterodine (DETROL LA) 2 MG 24 hr capsule, Take 1 capsule (2 mg total) by mouth daily., Disp: 30 capsule, Rfl:  5 ? ?PAST MEDICAL HISTORY: ?Past Medical History:  ?Diagnosis Date  ? Allergy   ? Arthritis   ? Cataracts, bilateral   ? bil cataracts removed  ? DDD (degenerative disc disease), lumbosacral   ? pain radiates down left leg  ? Environmental and seasonal allergies   ? GERD (gastroesophageal reflux disease)   ? Hearing loss   ? bilateral hearing aids  ? Heart murmur   ? recently  saw cardiologist and said it was fine   ? Hx of adenomatous colonic polyps 04/23/2016  ? Hyperlipidemia   ? Wound dehiscence 05/31/2017  ? ? ?PAST SURGICAL HISTORY: ?Past Surgical History:  ?Procedure Laterality Date  ? APPLICATION OF ROBOTIC ASSISTANCE FOR SPINAL PROCEDURE N/A 05/27/2017  ? Procedure: APPLICATION OF ROBOTIC ASSISTANCE FOR SPINAL PROCEDURE;  Surgeon: Ditty, Kevan Ny, MD;  Location: [facility];  Service: Neurosurgery;  Laterality: N/A;  ? cataract    ? bilat  ? CIRCUMCISION    ?  COLONOSCOPY    ? COLONOSCOPY W/ POLYPECTOMY    ? LAMINECTOMY  05/27/2017  ? SACRAL  ? LUMBAR WOUND DEBRIDEMENT N/A 05/31/2017  ? Procedure: LUMBAR WOUND REVISION;  Surgeon: Eustace Moore, MD;  Location: [facility];  Service: Neurosurgery;  Laterality: N/A;  ? THORACIC DISCECTOMY N/A 10/01/2021  ? Procedure: T11-12 Decompression with possible instrumented fusion;  Surgeon: Eustace Moore, MD;  Location: [facility];  Service: Neurosurgery;  Laterality: N/A;  ? TONSILLECTOMY    ? wisdom teth extraction    ? ? ?FAMILY HISTORY: ?Family History  ?Problem Relation Age of Onset  ? Alzheimer's disease Father   ? Stroke Brother   ? Colon cancer Neg Hx   ? Esophageal cancer Neg Hx   ? Rectal cancer Neg Hx   ? Stomach cancer Neg Hx   ? Pancreatic cancer Neg Hx   ? Prostate cancer Neg Hx   ? ? ?SOCIAL HISTORY: ? ?Social History  ? ?Socioeconomic History  ? Marital status: Married  ?  Spouse name: Vaughan Basta  ? Number of children: 2  ? Years of education: Not on file  ? Highest education level: Professional school degree (e.g., MD, DDS, DVM, JD)  ?Occupational History  ? Not on  file  ?Tobacco Use  ? Smoking status: Former  ?  Packs/day: 1.00  ?  Types: Cigarettes  ?  Quit date: 08/02/2016  ?  Years since quitting: 5.4  ? Smokeless tobacco: Never  ?Vaping Use  ? Vaping Use: Never used  ?Subs

## 2022-01-01 NOTE — Telephone Encounter (Signed)
We have also not heard back from Dr Ronnald Ramp office patient was made an apt for today with Dr Felecia Shelling at 3 pm.Pt verbalized understanding. ?He has PT this am. Recommended that he bring notes if they have them discussing his decline from PT perspective.  ?

## 2022-01-03 ENCOUNTER — Ambulatory Visit: Payer: Medicare PPO | Admitting: Neurology

## 2022-03-06 ENCOUNTER — Other Ambulatory Visit: Payer: Self-pay | Admitting: Neurology

## 2022-05-20 ENCOUNTER — Telehealth: Payer: Self-pay | Admitting: Neurology

## 2022-05-20 NOTE — Telephone Encounter (Signed)
Called the patient back to get more information. He has been struggling with balance for some time but overall had been doing well. 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 States that it has caused problems with worsening balance concerns in the last week or so. He said he was only able to walk a .25 mile the other day due to fear of falling. He saw podiatry who discussed with him about taking vitamin b. He wanted to discuss with Dr Felecia Shelling on whether there was anything he would recommend that may help. He states its not necessarily hurting but the numbness is present and that is playing a factor into his balance he thinks being off. Advised I would keep an opening for if a sooner apt arises but that I would see if Dr Felecia Shelling had suggestion in the meantime. Pt was appreciative.

## 2022-05-20 NOTE — Telephone Encounter (Signed)
Called the patient and advised that he agrees we should try and get PT for gait disorder. He states getting in with NP or him whoever has a opening would be ok to get evaluated and get the order placed. I was able to work the patient in with our NP [place], on Wednesday 05/22/22 at 8:00 am with check in 7:30 am. Pt accepted this apt.   Per Dr Felecia Shelling "Agree with PT for gait disorder."

## 2022-05-20 NOTE — Patient Instructions (Signed)
Below is our plan:  We will refer you back to PT for balance and gait evaluation. Continue to be active.   Please make sure you are staying well hydrated. I recommend 50-60 ounces daily. Well balanced diet and regular exercise encouraged. Consistent sleep schedule with 6-8 hours recommended.   Please continue follow up with care team as directed.   Follow up with neurology in 6 months   You may receive a survey regarding today's visit. I encourage you to leave honest feed back as I do use this information to improve patient care. Thank you for seeing me today!

## 2022-05-20 NOTE — Progress Notes (Unsigned)
No chief complaint on file.   HISTORY OF PRESENT ILLNESS:  05/20/22 ALL:  [name] is a 86 y.o. male here today for follow up for worsening balance since last visit with Dr Felecia Shelling 12/2021. He is s/p T11-T12 decompression and fusion with Dr Ronnald Ramp 09/2021. He was last seen by Dr Felecia Shelling 12/2021 and doing better. He was back to walking about a mile per day. He was working with PT at that time. Last PT visit was . He reports that over the past wek, he has started having significant difficulty with balance. He has not been able to walk as far as normal. Now only about 1/4 of a mile. He does endorse some numbness of feet bilaterally. PCP recommended B vitamins but he has not started. He is also followed by podiatry for hammer toe, contracture of foot joint and acquired hallux rigidus ???right or left?    HISTORY (copied from Dr Garth Bigness previous note)  [name] is a 86 y.o. man with gait difficulty   Update 01/01/2022: He has done better compared to before the surgery but not to his baseline.   He has had a couple falls.   He had 2 falls while walking after 1/2 mile and he noted the legs giving out and not being able to get to a stable spot to hold onto.      At the last visit, I had added baclofen to see if that would help the gait spasticity.  He does not think it helped the gait much.  Of note, the event where he fell twice occurred after he started the baclofen.   Because of the degenerative changes and spinal stenosis at T11-T12 had markedly progressed compared to imaging from 2019, he was referred to Dr. Ronnald Ramp of neurosurgery.   He had spinal surgery 10/01/2021 and had some improvement of his gait..     On a good day he is walking up to a mile at a time and is noting more strength in his legs.    He is working wit PT.      Bladder function is better on Detrol LA with less urgency   History from initial consultation: He is an 86 year old man who has noted weakness January 2023. Along  with the weakness, he also notes gait was slower.   He was waling 3 miles daily in 2022.   Starting December 2022, he would get a sensation in both legs that would occur with standing or walking but not if he is sitting.   He went from walking well to a cane to a walker over the last few weeks.    The right leg is weaker than his left.  He has numbness on both feet  He had noted mild toe numbness x several years but the leg dysesthesia is new.   He denies any numbnes, pain or weakness in his arms.   He is needing to urina more frequently and he has had a little incontinence over the last month   He has a h/o spinal issues.   He had right sciatica type pain.   Prompting surgery in 2018.  He had PLIF L2-S2 in 2018 CT 05/08/2017 showed mild spinal stenosis at L2L3, L3L4, L4L5 and L5S1 but significant foraminal narrowing at ost of these levels.   He had grade 1 anterolisthesis at L5S1.   In T spine he had mild spinal stenosis associated with linear calcifications at T8T9.     Lumbar xrays  05/27/2017 show PLIF from L2-S2.     MRIs 09/15/2021 showed significant progressive degenerative changes at T11-T12 with myelopathic signal within the spinal cord.  He was referred to Dr. Ronnald Ramp who did surgery in February 2023   Imaging MRI of the brain 09/15/2021 shows mild to moderate generalized cortical atrophy with the ventricles proportionate to the extent of atrophy.  The brain parenchyma was normal for age.     MRI of the cervical spine 09/15/2021 showed normal spinal cord.  There were degenerative changes that could affect the right C5 nerve root at C4-C5 and mild spinal stenosis at C5-C6 but no significant spinal stenosis.  MRI of the thoracic spine 09/15/2021 showed myelopathic signal within the spinal cord at T11-T12.  At that level there is moderately severe spinal stenosis due to disc protrusion, endplate spurring and facet hypertrophy.  There was edema at the endplates.  At T8-T9, there were degenerative changes  corresponding to the calcifications noted on the CT scan from 2018   Labs 05/30/2021:  TSH, T4 hemoglobin, white blood cell were normal. Cholesterol was 172 and HDL was 101 Creatinine was elevated at 1.34.  Hemoglobin A1c borderline at 5 9, glucose 109   B12 3554 (193-986); folate elevated at 30.8 (3.1-17.5)   REVIEW OF SYSTEMS: Out of a complete 14 system review of symptoms, the patient complains only of the following symptoms, and all other reviewed systems are negative.   ALLERGIES: Allergies  Allergen Reactions   Diphenhydramine Anxiety and Other (See Comments)    Agitation, Feeling jittery   Gabapentin Other (See Comments)    "Mental status/awareness changes"     HOME MEDICATIONS: Outpatient Medications Prior to Visit  Medication Sig Dispense Refill   atorvastatin (LIPITOR) 10 MG tablet Take 10 mg by mouth at bedtime.     meloxicam (MOBIC) 15 MG tablet Take 15 mg by mouth daily as needed for pain.     metoprolol tartrate (LOPRESSOR) 25 MG tablet Take 0.5 tablets (12.5 mg total) by mouth 2 (two) times daily. (Patient taking differently: Take 12.5 mg by mouth at bedtime.) 60 tablet 2   Multiple Vitamin (MULTIVITAMIN) tablet Take 1 tablet by mouth daily.     Omega-3 Fatty Acids (FISH OIL PO) Take 1 capsule by mouth daily.     tolterodine (DETROL LA) 2 MG 24 hr capsule Take 1 capsule by mouth once daily 90 capsule 0   No facility-administered medications prior to visit.     PAST MEDICAL HISTORY: Past Medical History:  Diagnosis Date   Allergy    Arthritis    Cataracts, bilateral    bil cataracts removed   DDD (degenerative disc disease), lumbosacral    pain radiates down left leg   Environmental and seasonal allergies    GERD (gastroesophageal reflux disease)    Hearing loss    bilateral hearing aids   Heart murmur    recently  saw cardiologist and said it was fine    Hx of adenomatous colonic polyps 04/23/2016   Hyperlipidemia    Wound dehiscence 05/31/2017      PAST SURGICAL HISTORY: Past Surgical History:  Procedure Laterality Date   APPLICATION OF ROBOTIC ASSISTANCE FOR SPINAL PROCEDURE N/A 05/27/2017   Procedure: APPLICATION OF ROBOTIC ASSISTANCE FOR SPINAL PROCEDURE;  Surgeon: Ditty, Kevan Ny, MD;  Location: [facility];  Service: Neurosurgery;  Laterality: N/A;   cataract     bilat   CIRCUMCISION     COLONOSCOPY     COLONOSCOPY W/ POLYPECTOMY  LAMINECTOMY  05/27/2017   SACRAL   LUMBAR WOUND DEBRIDEMENT N/A 05/31/2017   Procedure: LUMBAR WOUND REVISION;  Surgeon: Eustace Moore, MD;  Location: [facility];  Service: Neurosurgery;  Laterality: N/A;   THORACIC DISCECTOMY N/A 10/01/2021   Procedure: T11-12 Decompression with possible instrumented fusion;  Surgeon: Eustace Moore, MD;  Location: [facility];  Service: Neurosurgery;  Laterality: N/A;   TONSILLECTOMY     wisdom teth extraction       FAMILY HISTORY: Family History  Problem Relation Age of Onset   Alzheimer's disease Father    Stroke Brother    Colon cancer Neg Hx    Esophageal cancer Neg Hx    Rectal cancer Neg Hx    Stomach cancer Neg Hx    Pancreatic cancer Neg Hx    Prostate cancer Neg Hx      SOCIAL HISTORY: Social History   Socioeconomic History   Marital status: Married    Spouse name: Vaughan Basta   Number of children: 2   Years of education: Not on file   Highest education level: Professional school degree (e.g., MD, DDS, DVM, JD)  Occupational History   Not on file  Tobacco Use   Smoking status: Former    Packs/day: 1.00    Types: Cigarettes    Quit date: 08/02/2016    Years since quitting: 5.8   Smokeless tobacco: Never  Vaping Use   Vaping Use: Never used  Substance and Sexual Activity   Alcohol use: Yes    Alcohol/week: 7.0 standard drinks of alcohol    Types: 7 Glasses of wine per week   Drug use: No   Sexual activity: Not on file  Other Topics Concern   Not on file  Social History Narrative   Married (wife dx Parkinson's 2019-20)   1  daughter   1 son   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 There is no height or weight on file to calculate BMI.  Generalized: Well developed, in no acute distress  Cardiology: normal rate and rhythm, no murmur auscultated  Respiratory: clear to auscultation bilaterally    Neurological examination  Mentation: Alert oriented to time, place, history taking. Follows all commands speech and language fluent Cranial nerve II-XII: Pupils were equal round reactive to light. Extraocular movements were full, visual field were full on confrontational test. Facial sensation and strength were normal. Uvula tongue midline. Head turning and shoulder shrug  were normal and symmetric. Motor: The motor testing reveals 5 over 5 strength of all 4 extremities. Good symmetric motor tone is noted throughout.  Sensory: Sensory testing is intact to soft touch on all 4 extremities. No evidence of extinction is noted.  Coordination: Cerebellar testing reveals good finger-nose-finger and heel-to-shin bilaterally.  Gait and station: Gait is normal. Tandem gait is normal. Romberg is negative. No drift is seen.  Reflexes: Deep tendon reflexes are symmetric and normal bilaterally.    DIAGNOSTIC DATA (LABS, IMAGING, TESTING) - I reviewed patient  records, labs, notes, testing and imaging myself where available.  Lab Results  Component Value Date   WBC 5.0 10/01/2021   HGB 15.4 10/01/2021   HCT 45.8 10/01/2021   MCV 90.2 10/01/2021   PLT 199 10/01/2021       Component Value Date/Time   NA 135 10/01/2021 1012   K 4.2 10/01/2021 1012   CL 102 10/01/2021 1012   CO2 26 10/01/2021 1012   GLUCOSE 108 (H) 10/01/2021 1012   BUN 20 10/01/2021 1012   CREATININE 1.07 10/01/2021 1012   CALCIUM 9.2 10/01/2021 1012   PROT 6.5 09/03/2021 1118   GFRNONAA >60 10/01/2021 1012   GFRAA >60 06/01/2017 1745   No results found for: "CHOL", "HDL", "LDLCALC", "LDLDIRECT", "TRIG", "CHOLHDL" No results found for: "HGBA1C" No results found for: "VITAMINB12" No results found for: "TSH"      No data to display               No data to display           ASSESSMENT AND PLAN  86 y.o. year old male  has a past medical history of Allergy, Arthritis, Cataracts, bilateral, DDD (degenerative disc disease), lumbosacral, Environmental and seasonal allergies, GERD (gastroesophageal reflux disease), Hearing loss, Heart murmur, adenomatous colonic polyps (04/23/2016), Hyperlipidemia, and Wound dehiscence (05/31/2017). here with    Thoracic myelopathy  Gait disturbance  Urinary urgency  Neuropathy  Reginal Lutes ***.  Healthy lifestyle habits encouraged. *** will follow up with PCP as directed. *** will return to see me in ***, sooner if needed. *** verbalizes understanding and agreement with this plan.   No orders of the defined types were placed in this encounter.    No orders of the defined types were placed in this encounter.    Debbora Presto, MSN, FNP-C 05/20/2022, 4:13 PM  [facility] [address], [place], [place] [address] [phone]

## 2022-05-20 NOTE — Telephone Encounter (Signed)
Pt is calling. Stated he needs a sooner appointment. Pt said he is having bad balance and can't hardly walk. Pt is requesting a call from nurse

## 2022-05-22 ENCOUNTER — Encounter: Payer: Self-pay | Admitting: Family Medicine

## 2022-05-22 ENCOUNTER — Ambulatory Visit: Payer: Medicare PPO | Admitting: Family Medicine

## 2022-05-22 ENCOUNTER — Telehealth: Payer: Self-pay | Admitting: Family Medicine

## 2022-05-22 VITALS — BP 167/100 | HR 61 | Ht 71.0 in | Wt 162.0 lb

## 2022-05-22 DIAGNOSIS — M4714 Other spondylosis with myelopathy, thoracic region: Secondary | ICD-10-CM | POA: Diagnosis not present

## 2022-05-22 DIAGNOSIS — G629 Polyneuropathy, unspecified: Secondary | ICD-10-CM

## 2022-05-22 DIAGNOSIS — R3915 Urgency of urination: Secondary | ICD-10-CM

## 2022-05-22 DIAGNOSIS — R2689 Other abnormalities of gait and mobility: Secondary | ICD-10-CM

## 2022-05-22 DIAGNOSIS — R269 Unspecified abnormalities of gait and mobility: Secondary | ICD-10-CM

## 2022-05-22 DIAGNOSIS — M7989 Other specified soft tissue disorders: Secondary | ICD-10-CM

## 2022-05-22 NOTE — Telephone Encounter (Signed)
Referral for physical therapy sent to [facility]. Phone: [phone], Fax: [phone]

## 2022-06-10 ENCOUNTER — Telehealth: Payer: Self-pay | Admitting: Family Medicine

## 2022-06-10 ENCOUNTER — Other Ambulatory Visit: Payer: Self-pay

## 2022-06-10 MED ORDER — TOLTERODINE TARTRATE ER 2 MG PO CP24: 2.0000 mg | ORAL_CAPSULE | Freq: Every day | ORAL | 0 refills | Status: DC

## 2022-06-10 NOTE — Telephone Encounter (Signed)
Pt request refill for tolterodine (DETROL LA) 2 MG 24 hr capsule at [name]

## 2022-06-17 NOTE — Telephone Encounter (Addendum)
Received a call from [facility] with [place]. The patient is working with them and they state he is doing well. On certain exercises there is increase in numbness in bilateral lower extremities. She says that where his previous surgery is they sometimes hear a pop with certain exercises. They wanted to make [place] aware and I advised that since this is specific to the area where the surgery was completed, they should really make the neurosurgeon aware. They were concerned hardware may be messed up and could be hitting a nerve since he describes there is numbness/tingling. Provided her with Dr Ronnald Ramp practice name and phone number. She was appreciative.

## 2022-07-04 ENCOUNTER — Other Ambulatory Visit (HOSPITAL_COMMUNITY): Payer: Self-pay | Admitting: Neurological Surgery

## 2022-07-04 DIAGNOSIS — R252 Cramp and spasm: Secondary | ICD-10-CM

## 2022-07-15 ENCOUNTER — Ambulatory Visit
Admission: RE | Admit: 2022-07-15 | Discharge: 2022-07-15 | Disposition: A | Discharge status: Home / Self Care | Payer: Medicare PPO | Source: Ambulatory Visit | Attending: Neurological Surgery | Admitting: Neurological Surgery

## 2022-07-15 DIAGNOSIS — R252 Cramp and spasm: Secondary | ICD-10-CM | POA: Diagnosis present

## 2022-07-16 ENCOUNTER — Ambulatory Visit: Payer: Medicare PPO | Admitting: Neurology

## 2022-07-17 ENCOUNTER — Ambulatory Visit: Payer: Medicare PPO | Admitting: Neurology

## 2022-07-17 ENCOUNTER — Encounter: Payer: Self-pay | Admitting: Neurology

## 2022-07-17 VITALS — BP 138/90 | HR 65 | Ht 71.0 in | Wt 160.6 lb

## 2022-07-17 DIAGNOSIS — M4645 Discitis, unspecified, thoracolumbar region: Secondary | ICD-10-CM

## 2022-07-17 DIAGNOSIS — R269 Unspecified abnormalities of gait and mobility: Secondary | ICD-10-CM

## 2022-07-17 DIAGNOSIS — R200 Anesthesia of skin: Secondary | ICD-10-CM

## 2022-07-17 DIAGNOSIS — G629 Polyneuropathy, unspecified: Secondary | ICD-10-CM

## 2022-07-17 DIAGNOSIS — M4714 Other spondylosis with myelopathy, thoracic region: Secondary | ICD-10-CM | POA: Diagnosis not present

## 2022-07-17 NOTE — Progress Notes (Signed)
[facility]  PATIENT: Glen [name] DOB: June 01, 1936  REFERRING DOCTOR OR PCP:  Kennieth Rad, MD SOURCE: Patient, notes from primary care, imaging reports, laboratory reports, CT scan images personally reviewed.  _________________________________   HISTORICAL  CHIEF COMPLAINT:  Chief Complaint  Patient presents with   Follow-up    RM 1, with wife. Does not have appt with neurosurgery until 01/15/22. Having ongoing issues with walking.     HISTORY OF PRESENT ILLNESS:  Glen [name] is a 86 y.o. man with gait difficulty  Update 07/17/2022 He has done better compared to before the surgery but did not return to his baseline and thinks gait may be a little bit worse now than it was a few months ago.Marland Kitchen  Despite issues, he is able to walk about 10-15 minutes with his cane but not a half a mile.Marland Kitchen   He has had no recent fall.   He did PT  He has numbness in his feet that he felt worsened after the surgery.    However the gait definitely improved as he needed a walker before the surgery  He has urinary dysfunction.  Detrol has only helped a little bit.   He has had a couple episodes of urge incontinence.    Imaging 09/15/2021 had shown thoracic myelopathy and he was referred to neurosurgery.  He had spinal surgery 10/01/2021 and had some improvement of his gait.Marland Kitchen     He had an MRI of the thoracic spine 07/15/2022.  It shows fluid in the T11-T12 disc with endplate irregularity/bone marrow edema.  There is still moderately severe spinal stenosis.  There are some increased signal within the spinal cord.   The T11-T12 level has some concern for ongoing discitis a new T2 hyperintense lobulated focus is seen near the anterior right paraspinal soft tissue at T12 and abscess cannot be ruled out.   History from initial consultation: He is an 86 year old man who has noted weakness January 2023.   Along with the weakness, he also notes gait was slower.   He was waling 3 miles  daily in 2022.   Starting December 2022, he would get a sensation in both legs that would occur with standing or walking but not if he is sitting.   He went from walking well to a cane to a walker over the last few weeks.    The right leg is weaker than his left.  He has numbness on both feet  He had noted mild toe numbness x several years but the leg dysesthesia is new.   He denies any numbnes, pain or weakness in his arms.   He is needing to urina more frequently and he has had a little incontinence over the last month  He has a h/o spinal issues.   He had right sciatica type pain.   Prompting surgery in 2018.  He had PLIF L2-S2 in 2018 CT 05/08/2017 showed mild spinal stenosis at L2L3, L3L4, L4L5 and L5S1 but significant foraminal narrowing at ost of these levels.   He had grade 1 anterolisthesis at L5S1.   In T spine he had mild spinal stenosis associated with linear calcifications at T8T9.     Lumbar xrays 05/27/2017 show PLIF from L2-S2.    MRIs 09/15/2021 showed significant progressive degenerative changes at T11-T12 with myelopathic signal within the spinal cord.  He was referred to Dr. Ronnald Ramp who did surgery at these levels in February 2023  Imaging MRI of the thoracic spine 07/15/2022.  It shows fluid in the T11-T12 disc with endplate irregularity/bone marrow edema.  There is still moderately severe spinal stenosis.  There are some increased signal within the spinal cord.   The T11-T12 level has some concern for ongoing discitis a new T2 hyperintense lobulated focus is seen near the anterior right paraspinal soft tissue at T12 and abscess cannot be ruled out.   MRI of the brain 09/15/2021 shows mild to moderate generalized cortical atrophy with the ventricles proportionate to the extent of atrophy.  The brain parenchyma was normal for age.    MRI of the cervical spine 09/15/2021 showed normal spinal cord.  There were degenerative changes that could affect the right C5 nerve root at C4-C5 and mild  spinal stenosis at C5-C6 but no significant spinal stenosis.  MRI of the thoracic spine 09/15/2021 showed myelopathic signal within the spinal cord at T11-T12.  At that level there is moderately severe spinal stenosis due to disc protrusion, endplate spurring and facet hypertrophy.  There was edema at the endplates.  At T8-T9, there were degenerative changes corresponding to the calcifications noted on the CT scan from 2018  Labs 05/30/2021:  TSH, T4 hemoglobin, white blood cell were normal. Cholesterol was 172 and HDL was 101 Creatinine was elevated at 1.34.  Hemoglobin A1c borderline at 5 9, glucose 109  B12 3554 (193-986); folate elevated at 30.8 (3.1-17.5)   REVIEW OF SYSTEMS: Constitutional: No fevers, chills, sweats, or change in appetite Eyes: No visual changes, double vision, eye pain Ear, nose and throat: No hearing loss, ear pain, nasal congestion, sore throat Cardiovascular: No chest pain, palpitations Respiratory:  No shortness of breath at rest or with exertion.   No wheezes GastrointestinaI: No nausea, vomiting, diarrhea, abdominal pain, fecal incontinence Genitourinary:  No dysuria, urinary retention or frequency.  No nocturia. Musculoskeletal:  No neck pain, back pain Integumentary: No rash, pruritus, skin lesions Neurological: as above Psychiatric: No depression at this time.  No anxiety Endocrine: No palpitations, diaphoresis, change in appetite, change in weigh or increased thirst Hematologic/Lymphatic:  No anemia, purpura, petechiae. Allergic/Immunologic: No itchy/runny eyes, nasal congestion, recent allergic reactions, rashes  ALLERGIES: Allergies  Allergen Reactions   Diphenhydramine Anxiety and Other (See Comments)    Agitation, Feeling jittery   Gabapentin Other (See Comments)    "Mental status/awareness changes"    HOME MEDICATIONS:  Current Outpatient Medications:    atorvastatin (LIPITOR) 10 MG tablet, Take 10 mg by mouth at bedtime., Disp: , Rfl:     meloxicam (MOBIC) 15 MG tablet, Take 15 mg by mouth daily as needed for pain., Disp: , Rfl:    metoprolol tartrate (LOPRESSOR) 25 MG tablet, Take 0.5 tablets (12.5 mg total) by mouth 2 (two) times daily. (Patient taking differently: Take 12.5 mg by mouth at bedtime.), Disp: 60 tablet, Rfl: 2   Multiple Vitamin (MULTIVITAMIN) tablet, Take 1 tablet by mouth daily., Disp: , Rfl:    Omega-3 Fatty Acids (FISH OIL PO), Take 1 capsule by mouth daily., Disp: , Rfl:    tolterodine (DETROL LA) 2 MG 24 hr capsule, Take 1 capsule (2 mg total) by mouth daily., Disp: 30 capsule, Rfl: 5  PAST MEDICAL HISTORY: Past Medical History:  Diagnosis Date   Allergy    Arthritis    Cataracts, bilateral    bil cataracts removed   DDD (degenerative disc disease), lumbosacral    pain radiates down left leg   Environmental and seasonal allergies    GERD (gastroesophageal reflux disease)  Hearing loss    bilateral hearing aids   Heart murmur    recently  saw cardiologist and said it was fine    Hx of adenomatous colonic polyps 04/23/2016   Hyperlipidemia    Wound dehiscence 05/31/2017    PAST SURGICAL HISTORY: Past Surgical History:  Procedure Laterality Date   APPLICATION OF ROBOTIC ASSISTANCE FOR SPINAL PROCEDURE N/A 05/27/2017   Procedure: APPLICATION OF ROBOTIC ASSISTANCE FOR SPINAL PROCEDURE;  Surgeon: Ditty, Benjamin Jared, MD;  Location: [facility];  Service: Neurosurgery;  Laterality: N/A;   cataract     bilat   CIRCUMCISION     COLONOSCOPY     COLONOSCOPY W/ POLYPECTOMY     LAMINECTOMY  05/27/2017   SACRAL   LUMBAR WOUND DEBRIDEMENT N/A 05/31/2017   Procedure: LUMBAR WOUND REVISION;  Surgeon: Jones, David S, MD;  Location: [facility] OR;  Service: Neurosurgery;  Laterality: N/A;   THORACIC DISCECTOMY N/A 10/01/2021   Procedure: T11-12 Decompression with possible instrumented fusion;  Surgeon: Jones, David S, MD;  Location: [facility] OR;  Service: Neurosurgery;  Laterality: N/A;   TONSILLECTOMY     wisdom teth  extraction      FAMILY HISTORY: Family History  Problem Relation Age of Onset   Alzheimer's disease Father    Stroke Brother    Colon cancer Neg Hx    Esophageal cancer Neg Hx    Rectal cancer Neg Hx    Stomach cancer Neg Hx    Pancreatic cancer Neg Hx    Prostate cancer Neg Hx     SOCIAL HISTORY:  Social History   Socioeconomic History   Marital status: Married    Spouse name: Linda   Number of children: 2   Years of education: Not on file   Highest education level: Professional school degree (e.g., MD, DDS, DVM, JD)  Occupational History   Not on file  Tobacco Use   Smoking status: Former    Packs/day: 1.00    Types: Cigarettes    Quit date: 08/02/2016    Years since quitting: 5.4   Smokeless tobacco: Never  Vaping Use   Vaping Use: Never used  Substance and Sexual Activity   Alcohol use: Yes    Alcohol/week: 7.0 standard drinks    Types: 7 Glasses of wine per week   Drug use: No   Sexual activity: Not on file  Other Topics Concern   Not on file  Social History Narrative   Married (wife dx Parkinson's 2019-20)   1 daughter   1 son   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   General: The patient is well-developed and well-nourished and in no acute distress  HEENT:  Head is [place]/AT.  Sclera are anicteric.     Neck/back: No carotid bruits are noted.  The neck is nontender with mildly   reduced range of  motion.  He has a well-healed scar over the lower thoracic spine and lumbar spine  Neurologic Exam  Mental status: The patient is alert and oriented x 3 at the time of the examination. The patient has apparent normal recent and remote memory, with an apparently normal attention span and concentration ability.   Speech is normal.  Cranial nerves: Extraocular movements are full.  Facial strength and sensation was normal.  No dysarthria is noted.  Mildly reduced hearing.  Motor:  Muscle bulk is normal.   Tone is normal. Strength is  5 / 5 in arms, 4+/5 EHL  Sensory:  He has normal sensation in arms and proximal legs.   He had mild reduced vibration sensation at the knee and moderate reduced in the feet but still has position sensation in the great toe.  Pinprick and touch sensation was normal.  Coordination: Cerebellar testing reveals good finger-nose-finger and reduced heel-to-shin bilaterally.  Gait and station: Station is normal.   His gait is mildly spastic and wide.  Turn is 6 steps fr 180 degrees.Marland Kitchen  He is unable to tandem walk.  T Romberg is positive.  Reflexes: Deep tendon reflexes are symmetric and normal in arms, 3 at knees and 3 right and  2 left ankle.   Plantar responses are flexor.       ASSESSMENT AND PLAN  Thoracic myelopathy  Gait disturbance  Urinary urgency  Numbness  Spasticity  Although the numbness he experiences is likely more related to the myelopathy rather than neuropathy, there was more numbness in the feet than elsewhere in the legs and we will check an NCV/EMG study to better determine if there is a superimposed neuropathy.  Check B12 and anti-MAG IgM antibody.   He should continue to stay active and exercise.   MRI 2 days ago showed that there continues to be spinal stenosis at T11-T12.  Additionally there are changes potentially worrisome for discitis.  I will check ESR and CRP. He will return in 6 months or sooner for new or worsening neurologic  symptoms.  40-minute office visit with the majority of the time spent face-to-face for history and physical, discussion/counseling and decision-making.  Additional time with record review and documentation.   Elisama Thissen A. Felecia Shelling, MD, [facility] 10/08/2540, 7:06 PM Certified in Neurology, Clinical Neurophysiology, Sleep Medicine and Neuroimaging  [facility] [address], [place], [place] [address] [phone]

## 2022-07-23 LAB — SEDIMENTATION RATE: Sed Rate: 9 mm/hr (ref 0–30)

## 2022-07-23 LAB — MAG IGM ANTIBODIES: MAG IgM Antibodies: <900 BTU (ref 0–999)

## 2022-07-23 LAB — C-REACTIVE PROTEIN: CRP: 46 mg/L — ABNORMAL HIGH (ref 0–10)

## 2022-07-23 LAB — MAG INTERPRETATION REFLEXED

## 2022-07-23 LAB — VITAMIN B12: Vitamin B-12: 1042 pg/mL (ref 232–1245)

## 2022-07-31 ENCOUNTER — Encounter: Payer: Self-pay | Admitting: Physical Medicine & Rehabilitation

## 2022-08-06 ENCOUNTER — Telehealth: Payer: Self-pay | Admitting: Neurology

## 2022-08-06 NOTE — Telephone Encounter (Signed)
Called pt back. Taking Tolterodine '2mg'$  po daily. Pt states he needs something different for bladder issues. Having worsening urinary incontinence.  Last 2.5-3wk gone from walking with cane to a walker. Also using wheelchair in the house.   He also wants to come in for appt asap. Aware I will discuss this with MD as well.

## 2022-08-06 NOTE — Telephone Encounter (Signed)
Called pt back. Advised I spoke with Dr. Felecia Shelling. He would like to see him tomorrow for f/u. Offered 930am, check in 9am/9:15am. He accepted. I scheduled. Aware MD will discuss other med options for urinary issues as well with him at appt.

## 2022-08-06 NOTE — Telephone Encounter (Signed)
Pt is calling. Stated he needs a medication change from the prescription that Dr. Felecia Shelling wrote from him. Stated it's giving him urinary problems. (Pt didn't know the name if the medication).

## 2022-08-07 ENCOUNTER — Inpatient Hospital Stay (HOSPITAL_COMMUNITY)
Admission: RE | Admit: 2022-08-07 | Discharge: 2022-08-09 | DRG: 460 | Disposition: A | Discharge status: Home / Self Care | Payer: Medicare PPO | Attending: Neurological Surgery | Admitting: Neurological Surgery

## 2022-08-07 ENCOUNTER — Inpatient Hospital Stay (HOSPITAL_COMMUNITY): Payer: Medicare PPO

## 2022-08-07 ENCOUNTER — Encounter: Payer: Self-pay | Admitting: Neurology

## 2022-08-07 ENCOUNTER — Telehealth: Payer: Self-pay | Admitting: Neurology

## 2022-08-07 ENCOUNTER — Other Ambulatory Visit: Payer: Self-pay

## 2022-08-07 ENCOUNTER — Encounter (HOSPITAL_COMMUNITY): Payer: Self-pay

## 2022-08-07 ENCOUNTER — Ambulatory Visit: Payer: Medicare PPO | Admitting: Neurology

## 2022-08-07 VITALS — BP 139/56 | HR 58 | Ht 71.0 in | Wt 148.0 lb

## 2022-08-07 DIAGNOSIS — Z823 Family history of stroke: Secondary | ICD-10-CM | POA: Diagnosis not present

## 2022-08-07 DIAGNOSIS — E785 Hyperlipidemia, unspecified: Secondary | ICD-10-CM | POA: Diagnosis present

## 2022-08-07 DIAGNOSIS — R261 Paralytic gait: Secondary | ICD-10-CM | POA: Diagnosis not present

## 2022-08-07 DIAGNOSIS — R29898 Other symptoms and signs involving the musculoskeletal system: Principal | ICD-10-CM | POA: Diagnosis present

## 2022-08-07 DIAGNOSIS — M4804 Spinal stenosis, thoracic region: Secondary | ICD-10-CM | POA: Diagnosis present

## 2022-08-07 DIAGNOSIS — Z820 Family history of epilepsy and other diseases of the nervous system: Secondary | ICD-10-CM | POA: Diagnosis not present

## 2022-08-07 DIAGNOSIS — Z9841 Cataract extraction status, right eye: Secondary | ICD-10-CM

## 2022-08-07 DIAGNOSIS — M4644 Discitis, unspecified, thoracic region: Secondary | ICD-10-CM

## 2022-08-07 DIAGNOSIS — K219 Gastro-esophageal reflux disease without esophagitis: Secondary | ICD-10-CM | POA: Diagnosis present

## 2022-08-07 DIAGNOSIS — M4714 Other spondylosis with myelopathy, thoracic region: Secondary | ICD-10-CM | POA: Diagnosis not present

## 2022-08-07 DIAGNOSIS — Z888 Allergy status to other drugs, medicaments and biological substances status: Secondary | ICD-10-CM

## 2022-08-07 DIAGNOSIS — Z974 Presence of external hearing-aid: Secondary | ICD-10-CM | POA: Diagnosis not present

## 2022-08-07 DIAGNOSIS — Z87891 Personal history of nicotine dependence: Secondary | ICD-10-CM

## 2022-08-07 DIAGNOSIS — R262 Difficulty in walking, not elsewhere classified: Secondary | ICD-10-CM | POA: Diagnosis present

## 2022-08-07 DIAGNOSIS — R32 Unspecified urinary incontinence: Secondary | ICD-10-CM | POA: Diagnosis present

## 2022-08-07 DIAGNOSIS — M5104 Intervertebral disc disorders with myelopathy, thoracic region: Principal | ICD-10-CM | POA: Diagnosis present

## 2022-08-07 DIAGNOSIS — Z981 Arthrodesis status: Secondary | ICD-10-CM

## 2022-08-07 DIAGNOSIS — R200 Anesthesia of skin: Secondary | ICD-10-CM

## 2022-08-07 DIAGNOSIS — Z9842 Cataract extraction status, left eye: Secondary | ICD-10-CM | POA: Diagnosis not present

## 2022-08-07 DIAGNOSIS — H9193 Unspecified hearing loss, bilateral: Secondary | ICD-10-CM | POA: Diagnosis present

## 2022-08-07 DIAGNOSIS — I4891 Unspecified atrial fibrillation: Secondary | ICD-10-CM | POA: Diagnosis not present

## 2022-08-07 DIAGNOSIS — Z86010 Personal history of colonic polyps: Secondary | ICD-10-CM | POA: Diagnosis not present

## 2022-08-07 DIAGNOSIS — R339 Retention of urine, unspecified: Secondary | ICD-10-CM | POA: Diagnosis present

## 2022-08-07 LAB — CBC
HCT: 40.3 % (ref 39.0–52.0)
Hemoglobin: 14 g/dL (ref 13.0–17.0)
MCH: 30.8 pg (ref 26.0–34.0)
MCHC: 34.7 g/dL (ref 30.0–36.0)
MCV: 88.6 fL (ref 80.0–100.0)
Platelets: 236 10*3/uL (ref 150–400)
RBC: 4.55 MIL/uL (ref 4.22–5.81)
RDW: 12.9 % (ref 11.5–15.5)
WBC: 6.5 10*3/uL (ref 4.0–10.5)
nRBC: 0 % (ref 0.0–0.2)

## 2022-08-07 LAB — C-REACTIVE PROTEIN: CRP: 1 mg/dL — ABNORMAL HIGH (ref <1.0)

## 2022-08-07 LAB — COMPREHENSIVE METABOLIC PANEL
ALT: 22 U/L (ref 0–44)
AST: 31 U/L (ref 15–41)
Albumin: 3.7 g/dL (ref 3.5–5.0)
Alkaline Phosphatase: 63 U/L (ref 38–126)
Anion gap: 8 (ref 5–15)
BUN: 21 mg/dL (ref 8–23)
CO2: 26 mmol/L (ref 22–32)
Calcium: 9.2 mg/dL (ref 8.9–10.3)
Chloride: 102 mmol/L (ref 98–111)
Creatinine, Ser: 1.28 mg/dL — ABNORMAL HIGH (ref 0.61–1.24)
GFR, Estimated: 55 mL/min — ABNORMAL LOW (ref >60)
Glucose, Bld: 142 mg/dL — ABNORMAL HIGH (ref 70–99)
Potassium: 4.2 mmol/L (ref 3.5–5.1)
Sodium: 136 mmol/L (ref 135–145)
Total Bilirubin: 0.6 mg/dL (ref 0.3–1.2)
Total Protein: 6.1 g/dL — ABNORMAL LOW (ref 6.5–8.1)

## 2022-08-07 LAB — SEDIMENTATION RATE: Sed Rate: 1 mm/hr (ref 0–16)

## 2022-08-07 MED ORDER — MELOXICAM 7.5 MG PO TABS
7.5000 mg | ORAL_TABLET | Freq: Every day | ORAL | Status: DC
  Administered 2022-08-07 – 2022-08-08 (×2): 7.5 mg via ORAL
  Filled 2022-08-07 (×3): qty 1

## 2022-08-07 MED ORDER — ALPRAZOLAM 0.5 MG PO TABS
0.5000 mg | ORAL_TABLET | Freq: Once | ORAL | Status: AC
  Administered 2022-08-07: 0.5 mg via ORAL
  Filled 2022-08-07: qty 1

## 2022-08-07 MED ORDER — ADULT MULTIVITAMIN W/MINERALS CH
1.0000 | ORAL_TABLET | Freq: Every day | ORAL | Status: DC
  Administered 2022-08-07 – 2022-08-08 (×2): 1 via ORAL
  Filled 2022-08-07 (×2): qty 1

## 2022-08-07 MED ORDER — POTASSIUM CHLORIDE IN NACL 20-0.9 MEQ/L-% IV SOLN
INTRAVENOUS | Status: DC
  Filled 2022-08-07 (×2): qty 1000

## 2022-08-07 MED ORDER — DOCUSATE SODIUM 100 MG PO CAPS
100.0000 mg | ORAL_CAPSULE | Freq: Two times a day (BID) | ORAL | Status: DC
  Administered 2022-08-07 – 2022-08-09 (×3): 100 mg via ORAL
  Filled 2022-08-07 (×3): qty 1

## 2022-08-07 MED ORDER — HYDROCODONE-ACETAMINOPHEN 5-325 MG PO TABS
1.0000 | ORAL_TABLET | ORAL | Status: DC | PRN
  Administered 2022-08-07: 1 via ORAL
  Filled 2022-08-07: qty 1

## 2022-08-07 MED ORDER — PANTOPRAZOLE SODIUM 40 MG PO TBEC
40.0000 mg | DELAYED_RELEASE_TABLET | Freq: Every day | ORAL | Status: DC
  Administered 2022-08-09: 40 mg via ORAL
  Filled 2022-08-07: qty 1

## 2022-08-07 MED ORDER — FESOTERODINE FUMARATE ER 4 MG PO TB24
4.0000 mg | ORAL_TABLET | Freq: Every day | ORAL | Status: DC
  Administered 2022-08-07 – 2022-08-09 (×3): 4 mg via ORAL
  Filled 2022-08-07 (×3): qty 1

## 2022-08-07 MED ORDER — LOSARTAN POTASSIUM 50 MG PO TABS
50.0000 mg | ORAL_TABLET | Freq: Every morning | ORAL | Status: DC
  Administered 2022-08-08 – 2022-08-09 (×2): 50 mg via ORAL
  Filled 2022-08-07 (×2): qty 1

## 2022-08-07 MED ORDER — PANTOPRAZOLE SODIUM 40 MG IV SOLR
40.0000 mg | Freq: Every day | INTRAVENOUS | Status: DC
  Administered 2022-08-07 – 2022-08-08 (×2): 40 mg via INTRAVENOUS
  Filled 2022-08-07 (×2): qty 10

## 2022-08-07 MED ORDER — METOPROLOL TARTRATE 12.5 MG HALF TABLET
12.5000 mg | ORAL_TABLET | Freq: Every day | ORAL | Status: DC
  Administered 2022-08-08: 12.5 mg via ORAL
  Filled 2022-08-07 (×2): qty 1

## 2022-08-07 MED ORDER — ACETAMINOPHEN 325 MG PO TABS: 650.0000 mg | ORAL_TABLET | ORAL | Status: DC | PRN

## 2022-08-07 MED ORDER — MORPHINE SULFATE (PF) 2 MG/ML IV SOLN: 2.0000 mg | INTRAVENOUS | Status: DC | PRN

## 2022-08-07 MED ORDER — DEXAMETHASONE SODIUM PHOSPHATE 10 MG/ML IJ SOLN
4.0000 mg | Freq: Four times a day (QID) | INTRAMUSCULAR | Status: DC
  Administered 2022-08-07 – 2022-08-08 (×3): 4 mg via INTRAVENOUS
  Filled 2022-08-07 (×3): qty 1

## 2022-08-07 MED ORDER — ALPRAZOLAM 0.5 MG PO TABS: ORAL_TABLET | ORAL | 0 refills | Status: DC

## 2022-08-07 MED ORDER — TAMSULOSIN HCL 0.4 MG PO CAPS: 0.4000 mg | ORAL_CAPSULE | Freq: Every day | ORAL | 5 refills | Status: DC

## 2022-08-07 NOTE — H&P (Signed)
Glen [name] is an 86 y.o. male.   HPI:  86 year old male who called in concerned about his difficulty ambulating that has been progressive over the last 3 weeks. He has gone from using a cane to using a walker to now using a wheelchair to getting around with. He denies any pain at the moment. He feels unsteady on his feet  Past Medical History:  Diagnosis Date   Allergy    Arthritis    Cataracts, bilateral    bil cataracts removed   DDD (degenerative disc disease), lumbosacral    pain radiates down left leg   Environmental and seasonal allergies    GERD (gastroesophageal reflux disease)    Hearing loss    bilateral hearing aids   Heart murmur    recently  saw cardiologist and said it was fine    Hx of adenomatous colonic polyps 04/23/2016   Hyperlipidemia    Wound dehiscence 05/31/2017    Past Surgical History:  Procedure Laterality Date   APPLICATION OF ROBOTIC ASSISTANCE FOR SPINAL PROCEDURE N/A 05/27/2017   Procedure: APPLICATION OF ROBOTIC ASSISTANCE FOR SPINAL PROCEDURE;  Surgeon: Ditty, Kevan Ny, MD;  Location: [facility];  Service: Neurosurgery;  Laterality: N/A;   cataract     bilat   CIRCUMCISION     COLONOSCOPY     COLONOSCOPY W/ POLYPECTOMY     LAMINECTOMY  05/27/2017   SACRAL   LUMBAR WOUND DEBRIDEMENT N/A 05/31/2017   Procedure: LUMBAR WOUND REVISION;  Surgeon: Eustace Moore, MD;  Location: [facility];  Service: Neurosurgery;  Laterality: N/A;   THORACIC DISCECTOMY N/A 10/01/2021   Procedure: T11-12 Decompression with possible instrumented fusion;  Surgeon: Eustace Moore, MD;  Location: [facility];  Service: Neurosurgery;  Laterality: N/A;   TONSILLECTOMY     wisdom teth extraction      Allergies  Allergen Reactions   Diphenhydramine Anxiety and Other (See Comments)    Agitation Jittery feeling   Neurontin [Gabapentin] Other (See Comments)    "Mental status/awareness changes"    Social History   Tobacco Use   Smoking status: Former    Packs/day: 1.00     Types: Cigarettes    Quit date: 08/02/2016    Years since quitting: 6.0   Smokeless tobacco: Never  Substance Use Topics   Alcohol use: Yes    Alcohol/week: 7.0 standard drinks of alcohol    Types: 7 Glasses of wine per week    Family History  Problem Relation Age of Onset   Alzheimer's disease Father    Stroke Brother    Colon cancer Neg Hx    Esophageal cancer Neg Hx    Rectal cancer Neg Hx    Stomach cancer Neg Hx    Pancreatic cancer Neg Hx    Prostate cancer Neg Hx      Review of Systems  Positive ROS: as above  All other systems have been reviewed and were otherwise negative with the exception of those mentioned in the HPI and as above.  Objective: Vital signs in last 24 hours: Temp:  [98.7 F (37.1 C)] 98.7 F (37.1 C) (12/20 1526) Pulse Rate:  [58-66] 66 (12/20 1526) Resp:  [16] 16 (12/20 1526) BP: (99-139)/(56-60) 99/60 (12/20 1526) SpO2:  [96 %] 96 % (12/20 1526) Weight:  [67.1 kg] 67.1 kg (12/20 0919)  General Appearance: Alert, cooperative, no distress, appears stated age Head: Normocephalic, without obvious abnormality, atraumatic Lungs: respirations unlabored Heart: Regular rate and rhythmd symmetric  all extremities Skin: Skin color, texture, turgor normal, no rashes or lesions  NEUROLOGIC:   Mental status: A&O x4, no aphasia, good attention span, Memory and fund of knowledge Motor Exam - grossly normal, normal tone and bulk Sensory Exam - grossly normal Reflexes: symmetric, no pathologic reflexes, No Hoffman's, No clonus Coordination - grossly normal Gait - unsteady Balance - unsteady Cranial Nerves: I: smell Not tested  II: visual acuity  OS: na    OD: na  II: visual fields Full to confrontation  II: pupils Equal, round, reactive to light  III,VII: ptosis None  III,IV,VI: extraocular muscles    V: mastication   V: facial light touch sensation    V,VII: corneal reflex    VII: facial muscle function - upper    VII: facial muscle  function - lower   VIII: hearing   IX: soft palate elevation    IX,X: gag reflex   XI: trapezius strength    XI: sternocleidomastoid strength   XI: neck flexion strength    XII: tongue strength      Data Review Lab Results  Component Value Date   WBC 5.0 10/01/2021   HGB 15.4 10/01/2021   HCT 45.8 10/01/2021   MCV 90.2 10/01/2021   PLT 199 10/01/2021   Lab Results  Component Value Date   NA 135 10/01/2021   K 4.2 10/01/2021   CL 102 10/01/2021   CO2 26 10/01/2021   BUN 20 10/01/2021   CREATININE 1.07 10/01/2021   GLUCOSE 108 (H) 10/01/2021   Lab Results  Component Value Date   INR 0.9 10/01/2021    Radiology: No results found.   Assessment/Plan: 86 year old male presented to [facility] today with difficulty ambulating. He is a about a year postop from his thoracic spine decompression. He was seen as an outpatient at his pcp who was concerned about infection on his mri. We will plan to get an MRI with and without contrast and some infection markers to help [place] determine a plan of care. We are concerned about infection at this moment. Discussed this with his son at bedside  [place] 08/07/2022 5:50 PM

## 2022-08-07 NOTE — Progress Notes (Signed)
[facility]  PATIENT: Glen [name] DOB: 1935/09/19  REFERRING DOCTOR OR PCP:  Kennieth Rad, MD SOURCE: Patient, notes from primary care, imaging reports, laboratory reports, CT scan images personally reviewed.  _________________________________   HISTORICAL  CHIEF COMPLAINT:  Chief Complaint  Patient presents with   Follow-up    Pt in room #10 with his son.  Pt here today to f/u with his Thoracic myelopathy.    HISTORY OF PRESENT ILLNESS:  [name] is a 86 y.o. man with gait difficulty  Update  12/202023 He was doing a bit better after the surgery and saw me last month and Dr. Ronnald Ramp December 6th.   He was able to take some steps without a cane.   However, the past week (starting around 07/30/2022, he began to note worsening gait.   He feels he cannot safely use the cane anymore.  He can go about 100 feet with a walker.   About 3 weeks ago, he could go almost 1/2 mile with a cane.     His bladder is also worse.  He has urge incontinence and he has hesitancy  He is noting more numbness and heaviness in his   He denies fevers.    At last visit, due to changes on the 07/15/2022  MRI, we checked the ESR and CRP --- ESR was only 9 though CRP was elevated.   Usually with infection, both would be high.   We will recheck today  He has done better compared to before the surgery but did not return to his baseline and thinks gait may be a little bit worse now than it was a few months ago.Marland Kitchen  Despite issues, he is able to walk about 10-15 minutes with his cane but not a half a mile.Marland Kitchen   He has had no recent fall.   He did PT  He has numbness in his feet that he felt worsened after the surgery.    However the gait definitely improved as he needed a walker before the surgery  He has urinary dysfunction.  Detrol has only helped a little bit.   He has had a couple episodes of urge incontinence.    Imaging 09/15/2021 had shown thoracic myelopathy and he was referred to  neurosurgery.  He had spinal surgery 10/01/2021 and had some improvement of his gait.Marland Kitchen     He had an MRI of the thoracic spine 07/15/2022.  It shows fluid in the T11-T12 disc with endplate irregularity/bone marrow edema.  There is still moderately severe spinal stenosis.  There are some increased signal within the spinal cord.   The T11-T12 level has some concern for ongoing discitis a new T2 hyperintense lobulated focus is seen near the anterior right paraspinal soft tissue at T12 and abscess cannot be ruled out.   History from initial consultation: He is an 86 year old man who has noted weakness January 2023.   Along with the weakness, he also notes gait was slower.   He was waling 3 miles daily in 2022.   Starting December 2022, he would get a sensation in both legs that would occur with standing or walking but not if he is sitting.   He went from walking well to a cane to a walker over the last few weeks.    The right leg is weaker than his left.  He has numbness on both feet  He had noted mild toe numbness x several years but the leg dysesthesia is new.  He denies any numbnes, pain or weakness in his arms.   He is needing to urina more frequently and he has had a little incontinence over the last month  He has a h/o spinal issues.   He had right sciatica type pain.   Prompting surgery in 2018.  He had PLIF L2-S2 in 2018 CT 05/08/2017 showed mild spinal stenosis at L2L3, L3L4, L4L5 and L5S1 but significant foraminal narrowing at ost of these levels.   He had grade 1 anterolisthesis at L5S1.   In T spine he had mild spinal stenosis associated with linear calcifications at T8T9.     Lumbar xrays 05/27/2017 show PLIF from L2-S2.    MRIs 09/15/2021 showed significant progressive degenerative changes at T11-T12 with myelopathic signal within the spinal cord.  He was referred to Dr. Ronnald Ramp who did surgery at these levels in February 2023  Imaging MRI of the thoracic spine 07/15/2022.  It shows fluid in the  T11-T12 disc with endplate irregularity/bone marrow edema.  There is still moderately severe spinal stenosis.  There are some increased signal within the spinal cord.   The T11-T12 level has some concern for ongoing discitis a new T2 hyperintense lobulated focus is seen near the anterior right paraspinal soft tissue at T12 and abscess cannot be ruled out.   MRI of the brain 09/15/2021 shows mild to moderate generalized cortical atrophy with the ventricles proportionate to the extent of atrophy.  The brain parenchyma was normal for age.    MRI of the cervical spine 09/15/2021 showed normal spinal cord.  There were degenerative changes that could affect the right C5 nerve root at C4-C5 and mild spinal stenosis at C5-C6 but no significant spinal stenosis.  MRI of the thoracic spine 09/15/2021 showed myelopathic signal within the spinal cord at T11-T12.  At that level there is moderately severe spinal stenosis due to disc protrusion, endplate spurring and facet hypertrophy.  There was edema at the endplates.  At T8-T9, there were degenerative changes corresponding to the calcifications noted on the CT scan from 2018  Labs 05/30/2021:  TSH, T4 hemoglobin, white blood cell were normal. Cholesterol was 172 and HDL was 101 Creatinine was elevated at 1.34.  Hemoglobin A1c borderline at 5 9, glucose 109  B12 3554 (193-986); folate elevated at 30.8 (3.1-17.5)   REVIEW OF SYSTEMS: Constitutional: No fevers, chills, sweats, or change in appetite Eyes: No visual changes, double vision, eye pain Ear, nose and throat: No hearing loss, ear pain, nasal congestion, sore throat Cardiovascular: No chest pain, palpitations Respiratory:  No shortness of breath at rest or with exertion.   No wheezes GastrointestinaI: No nausea, vomiting, diarrhea, abdominal pain, fecal incontinence Genitourinary:  No dysuria, urinary retention or frequency.  No nocturia. Musculoskeletal:  No neck pain, back pain Integumentary: No  rash, pruritus, skin lesions Neurological: as above Psychiatric: No depression at this time.  No anxiety Endocrine: No palpitations, diaphoresis, change in appetite, change in weigh or increased thirst Hematologic/Lymphatic:  No anemia, purpura, petechiae. Allergic/Immunologic: No itchy/runny eyes, nasal congestion, recent allergic reactions, rashes  ALLERGIES: Allergies  Allergen Reactions   Diphenhydramine Anxiety and Other (See Comments)    Agitation, Feeling jittery   Gabapentin Other (See Comments)    "Mental status/awareness changes"    HOME MEDICATIONS:  Current Outpatient Medications:    ALPRAZolam (XANAX) 0.5 MG tablet, Atke one or two before MRI, Disp: 2 tablet, Rfl: 0   Coenzyme Q10 (CO Q-10 PO), Take 2 capsules by mouth 2 (two)  times daily., Disp: , Rfl:    meloxicam (MOBIC) 15 MG tablet, Take 15 mg by mouth daily as needed for pain., Disp: , Rfl:    metoprolol tartrate (LOPRESSOR) 25 MG tablet, Take 0.5 tablets (12.5 mg total) by mouth 2 (two) times daily. (Patient taking differently: Take 12.5 mg by mouth at bedtime.), Disp: 60 tablet, Rfl: 2   Multiple Vitamin (MULTIVITAMIN) tablet, Take 1 tablet by mouth daily., Disp: , Rfl:    Omega-3 Fatty Acids (FISH OIL PO), Take 1 capsule by mouth daily., Disp: , Rfl:    tamsulosin (FLOMAX) 0.4 MG CAPS capsule, Take 1 capsule (0.4 mg total) by mouth daily., Disp: 30 capsule, Rfl: 5   tolterodine (DETROL LA) 2 MG 24 hr capsule, Take 1 capsule (2 mg total) by mouth daily., Disp: 90 capsule, Rfl: 0   UNABLE TO FIND, Take 1 capsule by mouth 2 (two) times daily. Med Name: Alaplex, Disp: , Rfl:   PAST MEDICAL HISTORY: Past Medical History:  Diagnosis Date   Allergy    Arthritis    Cataracts, bilateral    bil cataracts removed   DDD (degenerative disc disease), lumbosacral    pain radiates down left leg   Environmental and seasonal allergies    GERD (gastroesophageal reflux disease)    Hearing loss    bilateral hearing aids    Heart murmur    recently  saw cardiologist and said it was fine    Hx of adenomatous colonic polyps 04/23/2016   Hyperlipidemia    Wound dehiscence 05/31/2017    PAST SURGICAL HISTORY: Past Surgical History:  Procedure Laterality Date   APPLICATION OF ROBOTIC ASSISTANCE FOR SPINAL PROCEDURE N/A 05/27/2017   Procedure: APPLICATION OF ROBOTIC ASSISTANCE FOR SPINAL PROCEDURE;  Surgeon: Ditty, Kevan Ny, MD;  Location: [place];  Service: Neurosurgery;  Laterality: N/A;   cataract     bilat   CIRCUMCISION     COLONOSCOPY     COLONOSCOPY W/ POLYPECTOMY     LAMINECTOMY  05/27/2017   SACRAL   LUMBAR WOUND DEBRIDEMENT N/A 05/31/2017   Procedure: LUMBAR WOUND REVISION;  Surgeon: Eustace Moore, MD;  Location: [place];  Service: Neurosurgery;  Laterality: N/A;   THORACIC DISCECTOMY N/A 10/01/2021   Procedure: T11-12 Decompression with possible instrumented fusion;  Surgeon: Eustace Moore, MD;  Location: [place];  Service: Neurosurgery;  Laterality: N/A;   TONSILLECTOMY     wisdom teth extraction      FAMILY HISTORY: Family History  Problem Relation Age of Onset   Alzheimer's disease Father    Stroke Brother    Colon cancer Neg Hx    Esophageal cancer Neg Hx    Rectal cancer Neg Hx    Stomach cancer Neg Hx    Pancreatic cancer Neg Hx    Prostate cancer Neg Hx     SOCIAL HISTORY:  Social History   Socioeconomic History   Marital status: Married    Spouse name: Vaughan Basta   Number of children: 2   Years of education: Not on file   Highest education level: Professional school degree (e.g., MD, DDS, DVM, JD)  Occupational History   Not on file  Tobacco Use   Smoking status: Former    Packs/day: 1.00    Types: Cigarettes    Quit date: 08/02/2016    Years since quitting: 6.0   Smokeless tobacco: Never  Vaping Use   Vaping Use: Never used  Substance and Sexual Activity   Alcohol use: Yes  Alcohol/week: 7.0 standard drinks of alcohol    Types: 7 Glasses of wine per week   Drug  use: No   Sexual activity: Not on file  Other Topics Concern   Not on file  Social History Narrative   Married (wife dx Parkinson's 2019-20)   1 daughter   1 son   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   General: The patient is well-developed and well-nourished and in no acute distress  HEENT:  Head is [place]/AT.  Sclera are anicteric.     Neck/back: No carotid bruits are noted.  The neck is nontender with mildly reduced range of motion.  He has a well-healed scar over the lower thoracic spine and lumbar spine  Neurologic Exam  Mental status: The patient is alert and oriented x 3 at the time of the examination. The patient has apparent normal recent and remote memory, with an apparently normal attention span and concentration ability.   Speech is normal.  Cranial nerves: Extraocular movements are full.  Facial strength and sensation was normal.  No dysarthria is noted.  Mildly reduced hearing.  Motor:  Muscle bulk is normal.   Tone is normal. Strength is  5 / 5 in arms, 4+/5 EHL  Sensory:  He has normal sensation in arms and proximal legs.   He had moderate reduced vibration sensation at the knee and near absent at ankles  .  Pinprick and touch sensation were fairly normal.  Coordination: Cerebellar testing reveals good finger-nose-finger and reduced heel-to-shin bilaterally.  Gait and station: He needs to use both arms to rise  from the wheelchair.  He is able to do walk with a walker with medium size steps.  He cannot walk without the walker.Marland KitchenMarland KitchenRomberg is positive.  Reflexes: Deep tendon reflexes are symmetric and normal in arms, 3 at knees and 3 right and  2 left ankle.   Plantar responses are flexor.       ASSESSMENT AND PLAN  Discitis of thoracic region - Plan: Sedimentation rate, C-reactive protein, MR THORACIC SPINE W WO CONTRAST  Thoracic spinal stenosis - Plan: MR THORACIC SPINE W WO CONTRAST  Numbness  Thoracic myelopathy  Over the last 7 or 8 days, he has had significant worsening of his gait.  He went from being able to take some steps without a cane to requiring a walker over the last week.  Additionally, bladder function has worsened and he has urinary retention/hesitancy now.  Therefore, I am most concerned about the changes at T11-T12 worsening.  There was abnormal signal and some concern of infection on the 07/15/2022 MRI.  I we will recheck the MRI with and without contrast and also check ESR/CRP.  I will try to speak to Dr. Ronnald Ramp (he is currently in surgery) There is a lesser possibility that the worsening could  be due to a more rapidly progressive polyneuropathy.  However, I feel the odds of this is much lower.  We do have him set up for an NCV/EMG to further evaluate He should continue to stay active and exercise.   Tamsulosin for bladder.   He will return in 6 months or sooner for new or worsening neurologic symptoms.  60-minute office visit with the majority of the time spent face-to-face for history and physical, discussion/counseling and decision-making.  Additional time with record review and documentation and care coordination.   Alleta Avery A. Felecia Shelling, MD, [facility] 75/17/0017, 49:44 AM Certified in Neurology, Clinical Neurophysiology, Sleep Medicine and Neuroimaging  [facility] [address], [place], [place] [address] [phone]

## 2022-08-07 NOTE — Progress Notes (Signed)
Telephone order for one time dose of 0.5 mg xanax prior to MRI.

## 2022-08-07 NOTE — Telephone Encounter (Signed)
Urgent Craig Staggers: [phone] exp. 08/07/22-09/06/22 sent to [place] or [phone]

## 2022-08-08 ENCOUNTER — Inpatient Hospital Stay (HOSPITAL_COMMUNITY): Payer: Medicare PPO | Admitting: Anesthesiology

## 2022-08-08 ENCOUNTER — Inpatient Hospital Stay (HOSPITAL_COMMUNITY): Payer: Medicare PPO

## 2022-08-08 ENCOUNTER — Other Ambulatory Visit: Payer: Self-pay

## 2022-08-08 ENCOUNTER — Encounter (HOSPITAL_COMMUNITY)
Admission: RE | Disposition: A | Discharge status: Home / Self Care | Payer: Self-pay | Source: Home / Self Care | Attending: Neurological Surgery

## 2022-08-08 DIAGNOSIS — I4891 Unspecified atrial fibrillation: Secondary | ICD-10-CM

## 2022-08-08 DIAGNOSIS — M4804 Spinal stenosis, thoracic region: Secondary | ICD-10-CM

## 2022-08-08 DIAGNOSIS — M5104 Intervertebral disc disorders with myelopathy, thoracic region: Secondary | ICD-10-CM

## 2022-08-08 DIAGNOSIS — Z87891 Personal history of nicotine dependence: Secondary | ICD-10-CM

## 2022-08-08 HISTORY — PX: LAMINECTOMY WITH POSTERIOR LATERAL ARTHRODESIS LEVEL 1: SHX6335

## 2022-08-08 LAB — SURGICAL PCR SCREEN
MRSA, PCR: NEGATIVE
Staphylococcus aureus: NEGATIVE

## 2022-08-08 LAB — SEDIMENTATION RATE: Sed Rate: 2 mm/hr (ref 0–30)

## 2022-08-08 LAB — C-REACTIVE PROTEIN: CRP: <1 mg/L (ref 0–10)

## 2022-08-08 SURGERY — LAMINECTOMY WITH POSTERIOR LATERAL ARTHRODESIS LEVEL 1
Anesthesia: General | Site: Back

## 2022-08-08 MED ORDER — VANCOMYCIN HCL 1000 MG IV SOLR
INTRAVENOUS | Status: AC
  Filled 2022-08-08: qty 20

## 2022-08-08 MED ORDER — PHENYLEPHRINE HCL-NACL 20-0.9 MG/250ML-% IV SOLN
INTRAVENOUS | Status: DC | PRN
  Administered 2022-08-08: 25 ug/min via INTRAVENOUS

## 2022-08-08 MED ORDER — BUPIVACAINE HCL (PF) 0.25 % IJ SOLN
INTRAMUSCULAR | Status: AC
  Filled 2022-08-08: qty 30

## 2022-08-08 MED ORDER — PHENOL 1.4 % MT LIQD: 1.0000 | OROMUCOSAL | Status: DC | PRN

## 2022-08-08 MED ORDER — CEFAZOLIN SODIUM-DEXTROSE 2-4 GM/100ML-% IV SOLN
INTRAVENOUS | Status: AC
  Filled 2022-08-08: qty 100

## 2022-08-08 MED ORDER — SUCCINYLCHOLINE CHLORIDE 200 MG/10ML IV SOSY
PREFILLED_SYRINGE | INTRAVENOUS | Status: AC
  Filled 2022-08-08: qty 10

## 2022-08-08 MED ORDER — DEXAMETHASONE SODIUM PHOSPHATE 10 MG/ML IJ SOLN
4.0000 mg | Freq: Four times a day (QID) | INTRAMUSCULAR | Status: DC
  Administered 2022-08-08 (×2): 4 mg via INTRAVENOUS
  Filled 2022-08-08 (×3): qty 1

## 2022-08-08 MED ORDER — ORAL CARE MOUTH RINSE: 15.0000 mL | Freq: Once | OROMUCOSAL | Status: AC

## 2022-08-08 MED ORDER — CHLORHEXIDINE GLUCONATE 0.12 % MT SOLN: 15.0000 mL | Freq: Once | OROMUCOSAL | Status: AC

## 2022-08-08 MED ORDER — POTASSIUM CHLORIDE IN NACL 20-0.9 MEQ/L-% IV SOLN: INTRAVENOUS | Status: DC

## 2022-08-08 MED ORDER — EPHEDRINE 5 MG/ML INJ
INTRAVENOUS | Status: AC
  Filled 2022-08-08: qty 5

## 2022-08-08 MED ORDER — SENNA 8.6 MG PO TABS
1.0000 | ORAL_TABLET | Freq: Two times a day (BID) | ORAL | Status: DC
  Administered 2022-08-08 – 2022-08-09 (×2): 8.6 mg via ORAL
  Filled 2022-08-08 (×2): qty 1

## 2022-08-08 MED ORDER — ONDANSETRON HCL 4 MG PO TABS: 4.0000 mg | ORAL_TABLET | Freq: Four times a day (QID) | ORAL | Status: DC | PRN

## 2022-08-08 MED ORDER — ROCURONIUM BROMIDE 10 MG/ML (PF) SYRINGE
PREFILLED_SYRINGE | INTRAVENOUS | Status: AC
  Filled 2022-08-08: qty 10

## 2022-08-08 MED ORDER — 0.9 % SODIUM CHLORIDE (POUR BTL) OPTIME
TOPICAL | Status: DC | PRN
  Administered 2022-08-08: 1000 mL

## 2022-08-08 MED ORDER — ROCURONIUM BROMIDE 10 MG/ML (PF) SYRINGE
PREFILLED_SYRINGE | INTRAVENOUS | Status: DC | PRN
  Administered 2022-08-08: 10 mg via INTRAVENOUS
  Administered 2022-08-08: 50 mg via INTRAVENOUS
  Administered 2022-08-08 (×2): 20 mg via INTRAVENOUS

## 2022-08-08 MED ORDER — MENTHOL 3 MG MT LOZG: 1.0000 | LOZENGE | OROMUCOSAL | Status: DC | PRN

## 2022-08-08 MED ORDER — MORPHINE SULFATE (PF) 2 MG/ML IV SOLN: 2.0000 mg | INTRAVENOUS | Status: DC | PRN

## 2022-08-08 MED ORDER — BUPIVACAINE HCL (PF) 0.25 % IJ SOLN
INTRAMUSCULAR | Status: DC | PRN
  Administered 2022-08-08: 6 mL

## 2022-08-08 MED ORDER — CHLORHEXIDINE GLUCONATE CLOTH 2 % EX PADS: 6.0000 | MEDICATED_PAD | Freq: Every day | CUTANEOUS | Status: DC

## 2022-08-08 MED ORDER — LACTATED RINGERS IV SOLN: INTRAVENOUS | Status: DC

## 2022-08-08 MED ORDER — DEXAMETHASONE SODIUM PHOSPHATE 10 MG/ML IJ SOLN
INTRAMUSCULAR | Status: DC | PRN
  Administered 2022-08-08: 4 mg via INTRAVENOUS

## 2022-08-08 MED ORDER — LIDOCAINE 2% (20 MG/ML) 5 ML SYRINGE
INTRAMUSCULAR | Status: AC
  Filled 2022-08-08: qty 5

## 2022-08-08 MED ORDER — FENTANYL CITRATE (PF) 250 MCG/5ML IJ SOLN
INTRAMUSCULAR | Status: AC
  Filled 2022-08-08: qty 5

## 2022-08-08 MED ORDER — ONDANSETRON HCL 4 MG/2ML IJ SOLN: 4.0000 mg | Freq: Four times a day (QID) | INTRAMUSCULAR | Status: DC | PRN

## 2022-08-08 MED ORDER — THROMBIN 5000 UNITS EX SOLR: OROMUCOSAL | Status: DC | PRN

## 2022-08-08 MED ORDER — POLYETHYLENE GLYCOL 3350 17 G PO PACK: 17.0000 g | PACK | Freq: Every day | ORAL | Status: DC | PRN

## 2022-08-08 MED ORDER — THROMBIN 20000 UNITS EX SOLR
CUTANEOUS | Status: AC
  Filled 2022-08-08: qty 20000

## 2022-08-08 MED ORDER — ONDANSETRON HCL 4 MG/2ML IJ SOLN
INTRAMUSCULAR | Status: DC | PRN
  Administered 2022-08-08: 4 mg via INTRAVENOUS

## 2022-08-08 MED ORDER — EPHEDRINE SULFATE-NACL 50-0.9 MG/10ML-% IV SOSY
PREFILLED_SYRINGE | INTRAVENOUS | Status: DC | PRN
  Administered 2022-08-08 (×3): 2.5 mg via INTRAVENOUS

## 2022-08-08 MED ORDER — FENTANYL CITRATE (PF) 250 MCG/5ML IJ SOLN
INTRAMUSCULAR | Status: DC | PRN
  Administered 2022-08-08: 50 ug via INTRAVENOUS
  Administered 2022-08-08: 100 ug via INTRAVENOUS
  Administered 2022-08-08 (×2): 50 ug via INTRAVENOUS

## 2022-08-08 MED ORDER — ACETAMINOPHEN 325 MG PO TABS
650.0000 mg | ORAL_TABLET | ORAL | Status: DC | PRN
  Administered 2022-08-08 – 2022-08-09 (×2): 650 mg via ORAL
  Filled 2022-08-08 (×3): qty 2

## 2022-08-08 MED ORDER — CEFAZOLIN SODIUM-DEXTROSE 2-4 GM/100ML-% IV SOLN
2.0000 g | Freq: Three times a day (TID) | INTRAVENOUS | Status: AC
  Administered 2022-08-08 – 2022-08-09 (×2): 2 g via INTRAVENOUS
  Filled 2022-08-08 (×2): qty 100

## 2022-08-08 MED ORDER — DEXAMETHASONE 4 MG PO TABS
4.0000 mg | ORAL_TABLET | Freq: Four times a day (QID) | ORAL | Status: DC
  Administered 2022-08-09: 4 mg via ORAL
  Filled 2022-08-08 (×4): qty 1

## 2022-08-08 MED ORDER — CEFAZOLIN SODIUM-DEXTROSE 2-3 GM-%(50ML) IV SOLR
INTRAVENOUS | Status: DC | PRN
  Administered 2022-08-08: 2 g via INTRAVENOUS

## 2022-08-08 MED ORDER — LIDOCAINE 2% (20 MG/ML) 5 ML SYRINGE
INTRAMUSCULAR | Status: DC | PRN
  Administered 2022-08-08: 30 mg via INTRAVENOUS

## 2022-08-08 MED ORDER — THROMBIN 5000 UNITS EX SOLR
CUTANEOUS | Status: AC
  Filled 2022-08-08: qty 5000

## 2022-08-08 MED ORDER — CHLORHEXIDINE GLUCONATE 0.12 % MT SOLN
OROMUCOSAL | Status: AC
  Administered 2022-08-08: 15 mL via OROMUCOSAL
  Filled 2022-08-08: qty 15

## 2022-08-08 MED ORDER — MAGNESIUM CITRATE PO SOLN: 1.0000 | Freq: Once | ORAL | Status: DC | PRN

## 2022-08-08 MED ORDER — PHENYLEPHRINE 80 MCG/ML (10ML) SYRINGE FOR IV PUSH (FOR BLOOD PRESSURE SUPPORT)
PREFILLED_SYRINGE | INTRAVENOUS | Status: DC | PRN
  Administered 2022-08-08: 80 ug via INTRAVENOUS

## 2022-08-08 MED ORDER — SODIUM CHLORIDE 0.9 % IV SOLN: 250.0000 mL | INTRAVENOUS | Status: DC

## 2022-08-08 MED ORDER — SODIUM CHLORIDE 0.9% FLUSH: 3.0000 mL | INTRAVENOUS | Status: DC | PRN

## 2022-08-08 MED ORDER — SODIUM CHLORIDE 0.9% FLUSH
3.0000 mL | Freq: Two times a day (BID) | INTRAVENOUS | Status: DC
  Administered 2022-08-08 – 2022-08-09 (×2): 3 mL via INTRAVENOUS

## 2022-08-08 MED ORDER — METHOCARBAMOL 500 MG PO TABS
500.0000 mg | ORAL_TABLET | Freq: Four times a day (QID) | ORAL | Status: DC | PRN
  Administered 2022-08-08: 500 mg via ORAL
  Filled 2022-08-08 (×2): qty 1

## 2022-08-08 MED ORDER — PROPOFOL 10 MG/ML IV BOLUS
INTRAVENOUS | Status: DC | PRN
  Administered 2022-08-08: 140 mg via INTRAVENOUS

## 2022-08-08 MED ORDER — METHOCARBAMOL 1000 MG/10ML IJ SOLN: 500.0000 mg | Freq: Four times a day (QID) | INTRAVENOUS | Status: DC | PRN

## 2022-08-08 MED ORDER — THROMBIN 20000 UNITS EX SOLR: CUTANEOUS | Status: DC | PRN

## 2022-08-08 MED ORDER — PROPOFOL 10 MG/ML IV BOLUS
INTRAVENOUS | Status: AC
  Filled 2022-08-08: qty 20

## 2022-08-08 MED ORDER — ACETAMINOPHEN 650 MG RE SUPP: 650.0000 mg | RECTAL | Status: DC | PRN

## 2022-08-08 MED ORDER — SUGAMMADEX SODIUM 200 MG/2ML IV SOLN
INTRAVENOUS | Status: DC | PRN
  Administered 2022-08-08: 268 mg via INTRAVENOUS

## 2022-08-08 SURGICAL SUPPLY — 56 items
BAG COUNTER SPONGE SURGICOUNT (BAG) ×1 IMPLANT
BASKET BONE COLLECTION (BASKET) IMPLANT
BENZOIN TINCTURE PRP APPL 2/3 (GAUZE/BANDAGES/DRESSINGS) ×1 IMPLANT
BLADE BONE MILL MEDIUM (MISCELLANEOUS) IMPLANT
BLADE CLIPPER SURG (BLADE) IMPLANT
BONE FIBERS PLIAFX 10 (Bone Implant) ×1 IMPLANT
BUR CARBIDE MATCH 3.0 (BURR) ×1 IMPLANT
CANISTER SUCT 3000ML PPV (MISCELLANEOUS) ×1 IMPLANT
CNTNR URN SCR LID CUP LEK RST (MISCELLANEOUS) ×1 IMPLANT
CONT SPEC 4OZ STRL OR WHT (MISCELLANEOUS) ×1
COVER BACK TABLE 60X90IN (DRAPES) ×1 IMPLANT
DERMABOND ADVANCED .7 DNX12 (GAUZE/BANDAGES/DRESSINGS) IMPLANT
DRAPE C-ARM 42X72 X-RAY (DRAPES) IMPLANT
DRAPE LAPAROTOMY 100X72X124 (DRAPES) ×1 IMPLANT
DRAPE SURG 17X23 STRL (DRAPES) ×1 IMPLANT
DRSG OPSITE POSTOP 4X6 (GAUZE/BANDAGES/DRESSINGS) IMPLANT
DURAPREP 26ML APPLICATOR (WOUND CARE) ×1 IMPLANT
ELECT REM PT RETURN 9FT ADLT (ELECTROSURGICAL) ×1
ELECTRODE REM PT RTRN 9FT ADLT (ELECTROSURGICAL) ×1 IMPLANT
EVACUATOR 1/8 PVC DRAIN (DRAIN) IMPLANT
GAUZE 4X4 16PLY ~~LOC~~+RFID DBL (SPONGE) IMPLANT
GLOVE BIO SURGEON STRL SZ7 (GLOVE) IMPLANT
GLOVE BIO SURGEON STRL SZ8 (GLOVE) ×2 IMPLANT
GLOVE BIOGEL PI IND STRL 7.0 (GLOVE) IMPLANT
GOWN STRL REUS W/ TWL LRG LVL3 (GOWN DISPOSABLE) IMPLANT
GOWN STRL REUS W/ TWL XL LVL3 (GOWN DISPOSABLE) ×2 IMPLANT
GOWN STRL REUS W/TWL 2XL LVL3 (GOWN DISPOSABLE) IMPLANT
GOWN STRL REUS W/TWL LRG LVL3 (GOWN DISPOSABLE)
GOWN STRL REUS W/TWL XL LVL3 (GOWN DISPOSABLE) ×2
GRAFT BNE FBR PLIAFX PRIME 10 (Bone Implant) IMPLANT
GRAFT BONE PROTEIOS XL 10CC (Orthopedic Implant) IMPLANT
HEMOSTAT POWDER KIT SURGIFOAM (HEMOSTASIS) IMPLANT
KIT BASIN OR (CUSTOM PROCEDURE TRAY) ×1 IMPLANT
KIT TURNOVER KIT B (KITS) ×1 IMPLANT
NDL HYPO 25X1 1.5 SAFETY (NEEDLE) ×1 IMPLANT
NEEDLE HYPO 25X1 1.5 SAFETY (NEEDLE) ×1 IMPLANT
NS IRRIG 1000ML POUR BTL (IV SOLUTION) ×1 IMPLANT
PACK LAMINECTOMY NEURO (CUSTOM PROCEDURE TRAY) ×1 IMPLANT
PAD ARMBOARD 7.5X6 YLW CONV (MISCELLANEOUS) ×3 IMPLANT
ROD LORD LIPPED TI 5.5X40 (Rod) IMPLANT
ROD LORD LIPPED TI 5.5X45 (Rod) IMPLANT
SCREW KODIAK 6.5X45 (Screw) IMPLANT
SCREW KODIAK 6.5X50MM (Screw) IMPLANT
SET SCREW (Screw) ×4 IMPLANT
SET SCREW SPNE (Screw) IMPLANT
SPONGE SURGIFOAM ABS GEL 100 (HEMOSTASIS) ×1 IMPLANT
SPONGE T-LAP 4X18 ~~LOC~~+RFID (SPONGE) IMPLANT
STRIP CLOSURE SKIN 1/2X4 (GAUZE/BANDAGES/DRESSINGS) ×2 IMPLANT
SUT VIC AB 0 CT1 18XCR BRD8 (SUTURE) ×1 IMPLANT
SUT VIC AB 0 CT1 8-18 (SUTURE) ×1
SUT VIC AB 2-0 CP2 18 (SUTURE) ×1 IMPLANT
SUT VIC AB 3-0 SH 8-18 (SUTURE) ×2 IMPLANT
TOWEL GREEN STERILE (TOWEL DISPOSABLE) ×1 IMPLANT
TOWEL GREEN STERILE FF (TOWEL DISPOSABLE) ×1 IMPLANT
TRAY FOLEY MTR SLVR 16FR STAT (SET/KITS/TRAYS/PACK) IMPLANT
WATER STERILE IRR 1000ML POUR (IV SOLUTION) ×1 IMPLANT

## 2022-08-08 NOTE — Anesthesia Preprocedure Evaluation (Signed)
Anesthesia Evaluation  Patient identified by MRN, date of birth, ID band Patient awake  General Assessment Comment:History noted Dr.Memory Heinrichs   Reviewed: Allergy & Precautions, NPO status , Patient's Chart, lab work & pertinent test results  Airway Mallampati: II       Dental   Pulmonary former smoker   breath sounds clear to auscultation       Cardiovascular + dysrhythmias Atrial Fibrillation  Rhythm:Regular Rate:Normal  History noted Dr. Linus Galas   Neuro/Psych    GI/Hepatic Neg liver ROS,GERD  ,,  Endo/Other  negative endocrine ROS    Renal/GU negative Renal ROS     Musculoskeletal  (+) Arthritis ,    Abdominal   Peds  Hematology   Anesthesia Other Findings   Reproductive/Obstetrics                             Anesthesia Physical Anesthesia Plan  ASA: 3  Anesthesia Plan: General   Post-op Pain Management:    Induction: Intravenous  PONV Risk Score and Plan: 2 and Ondansetron and Dexamethasone  Airway Management Planned:   Additional Equipment:   Intra-op Plan:   Post-operative Plan: Possible Post-op intubation/ventilation  Informed Consent: I have reviewed the patients History and Physical, chart, labs and discussed the procedure including the risks, benefits and alternatives for the proposed anesthesia with the patient or authorized representative who has indicated his/her understanding and acceptance.     Dental advisory given  Plan Discussed with: CRNA and Anesthesiologist  Anesthesia Plan Comments:        Anesthesia Quick Evaluation

## 2022-08-08 NOTE — Progress Notes (Signed)
Mobility Specialist Progress Note   08/08/22 1111  Mobility  Activity Ambulated with assistance in hallway  Level of Assistance Minimal assist, patient does 75% or more  Assistive Device Front wheel walker  Distance Ambulated (ft) 204 ft  Activity Response Tolerated well  $Mobility charge 1 Mobility   During Mobility: 82 HR, 90% SpO2 on RA  Received in bed having no complaints and agreeable. Pt able to get EOB w/o physical assistance but x2 attempts to stand d/t Bil LE weakness, MinA to fully stand upright. Pt having shuffle like gait when ambulating in hallway but no faults throughout. X1 seated break for energy conservation and slight SOB but no complaint of back pain. Returned back to room and placed in chair, call bell in reach and chair alarm on.     Holland Falling Mobility Specialist Please contact via SecureChat or  Rehab office at [phone]

## 2022-08-08 NOTE — Progress Notes (Signed)
Pt. Off unit to OR.

## 2022-08-08 NOTE — Progress Notes (Signed)
Patient ID: Glen [name], male   DOB: 28-Nov-1935, 86 y.o.   MRN: [id number] I have gone over his imaging in detail.  86 year old with urinary retention and incontinence, and progressive spasticity and inability to walk for the last week.  Sed rate and CRP are 1.0.  White count is normal.  He has some back pain when standing and walking but not severe.  I have spent considerable time going over the imaging.  I discussed the imaging with Dr. Saintclair Halsted.  We have talked extensively about surgical options.  Given this I suspect that the changes within the disc base at T11-12 are degenerative in nature but I told him that I cannot promise him this.  Had a previous L2-S2 fusion and he has slight kyphosis above this.  Because of the use of bone morphogenic protein at the last surgery there is bone growth all around the screw heads King extension of this instrumentation extremely difficult.  Also, the hardware would have to take a kyphotic turn at L1 and I think this would be a weak point in the construct and potentially could lead to failure.  M unsure about the bone quality at T11 and T12, and therefore I think there is some risk to a short segment decompression and instrumented fusion, but I think the risk of this is less than a T9-L2 extension.  I have explained this to he and his son as best that I can.  I recommended a T11-12 repeat decompression and instrumented fusion with pedicle screws at T11-12.  He understands the risk of the surgery include but are not limited to bleeding, infection, CSF leak, nerve injury, spinal cord injury, paralysis, loss of bowel bladder or sexual function, numbness, weakness, lack of relief of symptoms, worsening symptoms, pseudoarthrosis, hardware failure, need for further surgery, adjacent level disease, and anesthesia risk including DVT pneumonia MI and death.  He agrees to proceed  Specifically about spinal cord injury and paralysis.  He understands that even a successful  decompression and instrumented fusion without neurological worsening does not promise neurologic improvement.  Once he has bladder incontinence and spasticity and difficulty with gait this may or may not improve even with successful repeat decompression.  He understands that there is a much higher risk of paralysis with this surgery than at the index surgery and he is really taking all of his risk upfront at the time of surgery since he is rapidly progressing with neurologic decline and there is a extremely high chance that he will end up externally paralyzed and in a wheelchair without surgery.  So we do think this is his greatest chance of improvement but cannot quantify that chance of improvement and cannot really quantify the risk of neurologic decline with surgery.  He and his son completely understand and are willing to move forward with surgery in the hopes of neurologic improvement or saving function.

## 2022-08-08 NOTE — Anesthesia Procedure Notes (Signed)
Procedure Name: Intubation Date/Time: 08/08/2022 2:05 PM  Performed by: Eligha Bridegroom, CRNAPre-anesthesia Checklist: Patient identified, Emergency Drugs available, Suction available, Patient being monitored and Timeout performed Patient Re-evaluated:Patient Re-evaluated prior to induction Oxygen Delivery Method: Circle system utilized Preoxygenation: Pre-oxygenation with 100% oxygen Induction Type: IV induction Ventilation: Mask ventilation without difficulty Laryngoscope Size: Mac and 4 Grade View: Grade II Tube type: Oral Tube size: 7.5 mm Number of attempts: 1 Airway Equipment and Method: Stylet Placement Confirmation: ETT inserted through vocal cords under direct vision, positive ETCO2 and breath sounds checked- equal and bilateral Secured at: 22 cm Tube secured with: Tape Dental Injury: Teeth and Oropharynx as per pre-operative assessment

## 2022-08-08 NOTE — Op Note (Signed)
08/08/2022  4:41 PM  PATIENT:  Glen [name]  86 y.o. 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, thoracic myelopathy with gait instability, severe degenerative disc disease T11-12  POST-OPERATIVE DIAGNOSIS:  same  PROCEDURE: Thoracic reexploration with repeat thoracic T11-12 decompression with bilateral Hemi facetectomy and foraminotomies, posterolateral arthrodesis T11-12 utilizing local harvested morselized autologous bone graft and morselized allograft, and nonsegmental fixation T11-12 utilizing Alphatec pedicle screws  SURGEON:  Sherley Bounds, MD  ASSISTANTS: Glenford Peers, FNP  ANESTHESIA:   General  EBL: 100 ml  Total I/O In: 1100 [I.V.:1100] Out: 600 [Urine:500; Blood:100]  BLOOD ADMINISTERED: none  DRAINS: None  SPECIMEN:  none  INDICATION FOR PROCEDURE: This patient presented with acute progressive neurologic dysfunction and gait instability. Imaging showed severe degenerative disc disease T11-12 with instability and recurrent stenosis cord compression. The patient tried conservative measures without relief. Pain was debilitating. Recommended repeat thoracic decompression and instrumented fusion. Patient understood the risks, benefits, and alternatives and potential outcomes and wished to proceed.  PROCEDURE DETAILS: The patient was taken to the operating room and after induction of adequate generalized endotracheal anesthesia, the patient was rolled into the prone position on chest rolls and all pressure points were padded. The thoracic region was cleaned and then prepped with DuraPrep and draped in the usual sterile fashion. 5 cc of local anesthesia was injected and then a dorsal midline incision was made and carried down to the thoracic fascia. The fascia was opened and the paraspinous musculature was taken down in a subperiosteal fashion to expose T11-12 bilaterally as well as the transverse processes and facets and proximal rib heads.  Intraoperative fluoroscopy confirmed my level, and I started with placement of the T11 and T12 pedicle screws.  The pedicle screw entry zones were localized utilizing local landmarks and AP and lateral fluoroscopy.  We probed each pedicle with a straight thoracic pedicle probe and then palpated with a ball probe, tapped with a 4 5 tap and palpated once again with a ball probe.  We then placed 5.5 x 50 mm pedicle screws at T11 and T12 bilaterally and checked these with AP and lateral fluoroscopy and felt like they had excellent position and purchase.  Then I used a combination of the high-speed drill and the Kerrison punches to perform a lateral repeat Hemi facetectomy and foraminotomy at T11-12.  It was significant scar tissue in the midline but were able to identify the lateral recess and undercut the lateral recess over the disc space and remove the bilateral Hemi facets.  We were able to decompress pedicle to pedicle and skeletonized the pedicle above and below.  We were very gentle on her decompression and very careful not to manipulate the dura or compress the cord in any way.  Felt like we got an excellent decompression done very gently to reduce the risk of neurologic injury.  There appeared to be significant improvement in the stenosis.  I irrigated with saline solution containing bacitracin.  We decorticated the lateral facets and transverse processes and laid down a mixture of morselized allograft and local autograft to perform arthrodesis T11-12 bilaterally . achieved hemostasis with bipolar cautery, lined the dura with Gelfoam, and then closed the fascia with 0 Vicryl. I closed the subcutaneous tissues with 2-0 Vicryl and the subcuticular tissues with 3-0 Vicryl. The skin was then closed with benzoin and Steri-Strips. The drapes were removed, a sterile dressing was applied.  My nurse practitioner was involved in the exposure, placement of the screws, the  safe decompression, and the closure. the patient  was awakened from general anesthesia and transferred to the recovery room in stable condition. At the end of the procedure all sponge, needle and instrument counts were correct.    PLAN OF CARE: Admit to inpatient   PATIENT DISPOSITION:  PACU - hemodynamically stable.   Delay start of Pharmacological VTE agent (>24hrs) due to surgical blood loss or risk of bleeding:  yes

## 2022-08-08 NOTE — Transfer of Care (Signed)
Immediate Anesthesia Transfer of Care Note  Patient: Glen [name]  Procedure(s) Performed: POSTERIOR LATERAL ARTHRODESIS Thoracic eleven-twelve with Pedicle Screw Fixation and Decompression (Back)  Patient Location: PACU  Anesthesia Type:General  Level of Consciousness: awake and alert   Airway & Oxygen Therapy: Patient Spontanous Breathing  Post-op Assessment: Report given to RN and Post -op Vital signs reviewed and stable  Post vital signs: Reviewed and stable  Last Vitals:  Vitals Value Taken Time  BP 157/78 08/08/22 1647  Temp 98   Pulse 88 08/08/22 1655  Resp 40 08/08/22 1655  SpO2 98 % 08/08/22 1655  Vitals shown include unvalidated device data.  Last Pain:  Vitals:   08/08/22 1257  TempSrc: Oral  PainSc:          Complications: No notable events documented.

## 2022-08-09 MED ORDER — HYDROCODONE-ACETAMINOPHEN 5-325 MG PO TABS: 1.0000 | ORAL_TABLET | ORAL | 0 refills | Status: DC | PRN

## 2022-08-09 MED ORDER — DEXAMETHASONE 2 MG PO TABS
2.0000 mg | ORAL_TABLET | Freq: Four times a day (QID) | ORAL | Status: DC
  Administered 2022-08-09: 2 mg via ORAL
  Filled 2022-08-09 (×3): qty 1

## 2022-08-09 MED ORDER — METHOCARBAMOL 500 MG PO TABS: 500.0000 mg | ORAL_TABLET | Freq: Four times a day (QID) | ORAL | 0 refills | Status: DC | PRN

## 2022-08-09 MED ORDER — DEXAMETHASONE SODIUM PHOSPHATE 10 MG/ML IJ SOLN: 2.0000 mg | Freq: Four times a day (QID) | INTRAMUSCULAR | Status: DC

## 2022-08-09 NOTE — Plan of Care (Signed)
  Problem: Education: Goal: Knowledge of General Education information will improve Description: Including pain rating scale, medication(s)/side effects and non-pharmacologic comfort measures Outcome: Progressing   Problem: Health Behavior/Discharge Planning: Goal: Ability to manage health-related needs will improve Outcome: Progressing   Problem: Activity: Goal: Risk for activity intolerance will decrease Outcome: Progressing   Problem: Nutrition: Goal: Adequate nutrition will be maintained Outcome: Progressing   Problem: Skin Integrity: Goal: Will show signs of wound healing Outcome: Progressing

## 2022-08-09 NOTE — Progress Notes (Signed)
Inpatient Rehab Admissions Coordinator:   Consult received and chart reviewed.  Note PT recommending outpatient follow up.  Will sign off for CIR at this time as pt mobilizing too well for inpatient rehab.    Shann Medal, PT, DPT Admissions Coordinator [phone] 08/09/22  11:39 AM

## 2022-08-09 NOTE — Discharge Summary (Signed)
Physician Discharge Summary  Patient ID: Glen [name] MRN: [id number] DOB/AGE: Jul 07, 1936 86 y.o.  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 The patient tolerated the procedure well and was taken to the recovery room and then to the floor in stable condition. The hospital course was routine. There were no complications. The wound remained clean dry and intact. Pt had appropriate back soreness. No complaints of leg pain or new N/T/W. The patient remained afebrile with stable vital signs, and tolerated a regular diet. The patient continued to increase activities, and pain was well controlled with oral pain medications.   Consults: None  Significant Diagnostic Studies:  Results for orders placed or performed during the hospital encounter of 08/07/22  Surgical pcr screen   Specimen: Nasal Mucosa; Nasal Swab  Result Value Ref Range   MRSA, PCR NEGATIVE NEGATIVE   Staphylococcus aureus NEGATIVE NEGATIVE  Sedimentation rate  Result Value Ref Range   Sed Rate 1 0 - 16 mm/hr  C-reactive protein  Result Value Ref Range   CRP 1.0 (H) <1.0 mg/dL  CBC  Result Value Ref Range   WBC 6.5 4.0 - 10.5 K/uL   RBC 4.55 4.22 - 5.81 MIL/uL   Hemoglobin 14.0 13.0 - 17.0 g/dL   HCT 40.3 39.0 - 52.0 %   MCV 88.6 80.0 - 100.0 fL   MCH 30.8 26.0 - 34.0 pg   MCHC 34.7 30.0 - 36.0 g/dL   RDW 12.9 11.5 - 15.5 %   Platelets 236 150 - 400 K/uL   nRBC 0.0 0.0 - 0.2 %  Comprehensive metabolic panel  Result Value Ref Range   Sodium 136 135 - 145 mmol/L   Potassium 4.2 3.5 - 5.1 mmol/L   Chloride 102 98 - 111 mmol/L   CO2 26 22  - 32 mmol/L   Glucose, Bld 142 (H) 70 - 99 mg/dL   BUN 21 8 - 23 mg/dL   Creatinine, Ser 1.28 (H) 0.61 - 1.24 mg/dL   Calcium 9.2 8.9 - 10.3 mg/dL   Total Protein 6.1 (L) 6.5 - 8.1 g/dL   Albumin 3.7 3.5 - 5.0 g/dL   AST 31 15 - 41 U/L   ALT 22 0 - 44 U/L   Alkaline Phosphatase 63 38 - 126 U/L   Total Bilirubin 0.6 0.3 - 1.2 mg/dL   GFR, Estimated 55 (L) >60 mL/min   Anion gap 8 5 - 15    DG THORACOLUMABAR SPINE  Result Date: 08/08/2022 CLINICAL DATA:  Fluoro guidance provided EXAM: THORACOLUMBAR SPINE - 2 VIEW FINDINGS: Dose: 7.3 mGy Fluoro time 46s IMPRESSION: C-arm fluoro guidance provided. Electronically Signed   By: Sammie Bench M.D.   On: 08/08/2022 18:01   DG C-Arm 1-60 Min-No Report  Result Date: 08/08/2022 Fluoroscopy was utilized by the requesting physician.  No radiographic interpretation.   DG C-Arm 1-60 Min-No Report  Result Date: 08/08/2022 Fluoroscopy was utilized by the requesting physician.  No radiographic interpretation.   MR LUMBAR SPINE WO CONTRAST  Result Date: 08/07/2022 CLINICAL DATA:  Difficulty ambulating, progressive EXAM: MRI LUMBAR SPINE WITHOUT CONTRAST TECHNIQUE: Multiplanar, multisequence MR imaging of the lumbar spine was performed. No intravenous contrast was administered.  COMPARISON:  09/28/2021 MRI lumbar spine and 07/15/2022 MRI thoracic spine FINDINGS: Segmentation: In keeping with previous numbering, there are 5 lumbar vertebral bodies with lumbarization of S1. Alignment: Unchanged mild dextroscoliosis. Fused trace retrolisthesis of L2 on L3, L3 on L4, and L4 on L5. 3 mm anterolisthesis L5 on S1, unchanged. Vertebrae: Status post L2-S2 fusion. No acute fracture, suspicious osseous lesion, or evidence of discitis in the lumbar spine. In the partially imaged T12 vertebral body, there is edema that appears similar to the 07/15/2022 MRI; the disc space is not included in the field of view. Conus medullaris and cauda equina: Conus extends to the  L1-L2 level. Conus and cauda equina appear normal. Paraspinal and other soft tissues: Redemonstrated fluid collection in the laminectomy bed posterior to L3-L5, which measures up to 2.4 x 3.5 x 7.6 cm (AP x TR x CC) (series 8, image 17 and series 5, image 9), previously 3.5 x 2.2 x 7 6 cm, overall unchanged and likely chronic. Atrophy of the inferior paraspinous musculature. Multiple renal cysts in the left greater than right kidney, for which no follow-up is currently indicated. Disc levels: T12-L1: Seen only on the sagittal images. Minimal disc bulge. No significant spinal canal stenosis or neural foraminal narrowing. L1-L2: Disc height loss and moderate disc bulge. Severe left and moderate right facet arthropathy. Anteromedially directed left facet synovial cyst, which measures up to 7 mm (series 8, image 2), unchanged. Ligamentum flavum hypertrophy. Mild to moderate spinal canal stenosis, unchanged. Narrowing of the lateral recesses. Mild bilateral neural foraminal narrowing. L2-L3: Status post fusion and decompression. No spinal canal stenosis or neural foraminal narrowing. L3-L4: Status post fusion and decompression. No spinal canal stenosis or neural foraminal narrowing. L4-L5: Status post fusion and decompression. No spinal canal stenosis. Mild right neural foraminal narrowing. L5-S1: Grade 1 anterolisthesis with disc unroofing. Status post fusion. No spinal canal stenosis or neural foraminal narrowing. IMPRESSION: 1. The thoracic spine is not imaged and this time as the patient could not tolerate the exam. Partial visualization of the T12 vertebral body demonstrates persistent edema. 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 2. L1-L2 mild to moderate spinal canal stenosis and mild bilateral neural foraminal narrowing, unchanged. Narrowing of the lateral recesses at this level could  affect the descending L2 nerve roots. 3. L4-L5 mild right neural foraminal narrowing. 4. Status post L2-S2 fusion and decompression, with no spinal canal stenosis or additional neural foraminal narrowing at these levels. Electronically Signed   By: Merilyn Baba M.D.   On: 08/07/2022 22:41   MR THORACIC SPINE WO CONTRAST  Result Date: 07/16/2022 CLINICAL DATA:  Back pain with lower extremity weakness. Lumbar surgery in February of 2023 EXAM: MRI THORACIC SPINE WITHOUT CONTRAST TECHNIQUE: Multiplanar, multisequence MR imaging of the thoracic spine was performed. No intravenous contrast was administered. COMPARISON:  MRI 09/15/2021 FINDINGS: Alignment:  Physiologic. Vertebrae: Fluid signal is again seen within the T11-T12 disc. Mildly progressive endplate irregularity centered at this level with persistent bone marrow edema and confluent low T1 signal changes throughout both vertebral bodies and extending into the left greater than right pedicles. Trace left T11-12 facet effusion. No acute fracture. No additional sites of bone marrow abnormality. No bone lesion. Cord: Cord compression at T11-T12 with mild cord edema. No epidural space fluid collection. Thoracic cord is otherwise normal in signal and morphology. Paraspinal and other soft tissues: Well  diff and lobulated T2 hyperintense structure within the anterior right paraspinal soft tissues near the T12 inferior endplate measuring 2.2 x 1.1 x 1.7 cm (series 20, image 37), new from prior. Disc levels: Severe canal stenosis with cord compression at the T11-12 level, minimally improved from prior following posterior decompression. Severe bilateral foraminal stenosis at this level. Shallow disc protrusion at T8-T9. No additional levels of foraminal or canal stenosis within the thoracic spine. IMPRESSION: 1. Appearance of the T11-T12 level is concerning for ongoing discitis-osteomyelitis with mildly progressive endplate irregularity and persistent bone marrow  edema. Severe degenerative spondylosis at this level could potentially account for these findings. Correlation for systemic signs of infection including serum inflammatory markers is recommended. 2. Severe canal stenosis with cord compression at T11-T12, minimally improved from prior following posterior decompression. Mild cord edema. Severe bilateral foraminal stenosis at this level. 3. New 2.2 x 1.1 x 1.7 cm lobulated T2 hyperintense structure within the anterior right paraspinal soft tissues near the T12 inferior endplate. Small abscess is not excluded. 4. No additional levels of foraminal or canal stenosis within the thoracic spine. These results will be called to the ordering clinician or representative by the Radiologist Assistant, and communication documented in the PACS or [employer]. Electronically Signed   By: Davina Poke D.O.   On: 07/16/2022 15:40    Antibiotics:  Anti-infectives (From admission, onward)    Start     Dose/Rate Route Frequency Ordered Stop   08/08/22 2000  ceFAZolin (ANCEF) IVPB 2g/100 mL premix        2 g 200 mL/hr over 30 Minutes Intravenous Every 8 hours 08/08/22 1745 08/09/22 0347   08/08/22 1351  ceFAZolin (ANCEF) 2-4 GM/100ML-% IVPB       Note to Pharmacy: Alphonsus Sias: cabinet override      08/08/22 1351 08/09/22 0159       Discharge Exam: Blood pressure (!) 145/63, pulse (!) 57, temperature 97.7 F (36.5 C), temperature source Oral, resp. rate 20, height '5\' 11"'$  (1.803 m), weight 67 kg, SpO2 100 %. Neurologic: Grossly normal Ambulating and voiding incision cdi   Discharge Medications:   Allergies as of 08/09/2022       Reactions   Diphenhydramine Anxiety, Other (See Comments)   Agitation Jittery feeling   Neurontin [gabapentin] Other (See Comments)   "Mental status/awareness changes"        Medication List     TAKE these medications    ALPRAZolam 0.5 MG tablet Commonly known as: Valentino Saxon one or two before MRI   CO Q-10  PO Take 2 capsules by mouth 2 (two) times daily.   FISH OIL PO Take 2 capsules by mouth at bedtime.   HYDROcodone-acetaminophen 5-325 MG tablet Commonly known as: NORCO/VICODIN Take 1 tablet by mouth every 4 (four) hours as needed for moderate pain.   losartan 50 MG tablet Commonly known as: COZAAR Take 50 mg by mouth in the morning.   meloxicam 15 MG tablet Commonly known as: MOBIC Take 7.5 mg by mouth at bedtime.   methocarbamol 500 MG tablet Commonly known as: ROBAXIN Take 1 tablet (500 mg total) by mouth every 6 (six) hours as needed for muscle spasms.   metoprolol tartrate 25 MG tablet Commonly known as: LOPRESSOR Take 0.5 tablets (12.5 mg total) by mouth 2 (two) times daily. What changed: when to take this   multivitamin tablet Take 1 tablet by mouth at bedtime.   tamsulosin 0.4 MG Caps capsule Commonly known as: FLOMAX Take 1  capsule (0.4 mg total) by mouth daily.   tolterodine 2 MG 24 hr capsule Commonly known as: DETROL LA Take 1 capsule (2 mg total) by mouth daily. What changed: when to take this               Durable Medical Equipment  (From admission, onward)           Start     Ordered   08/08/22 1746  DME Walker rolling  Once       Question:  Patient needs a walker to treat with the following condition  Answer:  S/P lumbar fusion   08/08/22 1745   08/08/22 1746  DME 3 n 1  Once        08/08/22 1745   08/08/22 1746  DME Bedside commode  Once       Question:  Patient needs a bedside commode to treat with the following condition  Answer:  Gait instability   08/08/22 1745            Disposition: home   Final Dx: posterior lateral fusion and decompression T11-T12  Discharge Instructions     Call MD for:  difficulty breathing, headache or visual disturbances   Complete by: As directed    Call MD for:  hives   Complete by: As directed    Call MD for:  persistant dizziness or light-headedness   Complete by: As directed    Call MD  for:  persistant nausea and vomiting   Complete by: As directed    Call MD for:  redness, tenderness, or signs of infection (pain, swelling, redness, odor or green/yellow discharge around incision site)   Complete by: As directed    Call MD for:  severe uncontrolled pain   Complete by: As directed    Call MD for:  temperature >100.4   Complete by: As directed    Diet - low sodium heart healthy   Complete by: As directed    Increase activity slowly   Complete by: As directed    Lifting restrictions   Complete by: As directed    No lifting more than 8 lbs        Follow-up [place] Follow up.   Contact information: [place]  [phone])  Physical therapy clinic [address]  [phone] Open  Closes 4:30?PM        Kennieth Rad, MD Follow up.   Specialties: Internal Medicine, Infectious Diseases Contact information: [facility] [place] [id number] [phone]         [place] Follow up.   Specialty: Neurosurgery Contact information: [address] [place] [address] [phone]                  Signed: Ocie [facility] 08/09/2022, 3:14 PM

## 2022-08-09 NOTE — TOC Initial Note (Signed)
Transition of Care [facility]) - Initial/Assessment Note    Patient Details  Name: Glen [name] MRN: [id number] Date of Birth: 1935-12-29  Transition of [facility] CM/SW Contact:    Sharin Mons, RN Phone Number: 08/09/2022, 11:50 AM  Clinical Narrative:           -s/p thoracic reexploration with repeat thoracic T11-12 decompression ,12/21        NCM spoke with pt regarding d/c needs. Shared PT's recommendations for outpatient PT services. Pt states would like outpatient PT services. States has used [facility] in the past and would like to use them again, made MD aware vis secured chat. Per [facility] , they will need a script from the MD faxed to [phone] for outpatient PT services.  Pt without DME needs. States once d/c wife to assist with care. Wife to provide transportation to home. Pt without Rx med concerns.   TOC team will continue to assist with TOC needs as they presents.  Expected Discharge Plan: Home/Self Care     Patient Goals and CMS Choice            Expected Discharge Plan and Services                                              Prior Living Arrangements/Services                       Activities of Daily Living Home Assistive Devices/Equipment: Gilford Rile (specify type) ADL Screening (condition at time of admission) Patient's cognitive ability adequate to safely complete daily activities?: Yes Is the patient deaf or have difficulty hearing?: No Does the patient have difficulty seeing, even when wearing glasses/contacts?: No Does the patient have difficulty concentrating, remembering, or making decisions?: No Patient able to express need for assistance with ADLs?: Yes Does the patient have difficulty dressing or bathing?: No Independently performs ADLs?: Yes (appropriate for developmental age) Does the patient have difficulty walking or climbing stairs?: Yes Weakness of Legs: Both Weakness of Arms/Hands:  None  Permission Sought/Granted                  Emotional Assessment              Admission diagnosis:  Leg weakness, bilateral [R29.898] Status post thoracic spinal fusion [Z98.1] Patient Active Problem List   Diagnosis Date Noted   Leg weakness, bilateral 08/07/2022   Status post thoracic spinal fusion 10/01/2021   PAF (paroxysmal atrial fibrillation) ([place])    S/P lumbar fusion    Spondylolisthesis 05/27/2017   Hx of adenomatous colonic polyps 04/23/2016   PCP:  Kennieth Rad, MD Pharmacy:   [facility], [place] [address] Phone: [phone] Fax: [phone]     Social Determinants of Health (SDOH) Social History: SDOH Screenings   Food Insecurity: No Food Insecurity (08/08/2022)  Housing: Low Risk  (08/08/2022)  Transportation Needs: No Transportation Needs (08/08/2022)  Utilities: Not At Risk (08/08/2022)  Tobacco Use: Medium Risk (08/07/2022)   SDOH Interventions:     Readmission Risk Interventions     No data to display

## 2022-08-09 NOTE — Progress Notes (Signed)
Patient ID: Glen [name], male   DOB: 1936-01-26, 86 y.o.   MRN: [id number] Subjective: Patient reports minimal pain, legs much better, foley out, has walked to the BR  Objective: Vital signs in last 24 hours: Temp:  [97.6 F (36.4 C)-97.8 F (36.6 C)] 97.7 F (36.5 C) (12/22 0745) Pulse Rate:  [57-86] 57 (12/22 0745) Resp:  [16-23] 20 (12/22 0745) BP: (101-157)/(57-98) 145/63 (12/22 0745) SpO2:  [93 %-100 %] 100 % (12/22 0745)  Intake/Output from previous day: 12/21 0701 - 12/22 0700 In: 1734.6 [P.O.:240; I.V.:1394.6; IV Piggyback:100] Out: 1100 [Urine:1000; Blood:100] Intake/Output this shift: Total I/O In: 809.9 [I.V.:709.9; IV Piggyback:100] Out: -   Neurologic: Grossly normal  Lab Results: Lab Results  Component Value Date   WBC 6.5 08/07/2022   HGB 14.0 08/07/2022   HCT 40.3 08/07/2022   MCV 88.6 08/07/2022   PLT 236 08/07/2022   Lab Results  Component Value Date   INR 0.9 10/01/2021   BMET Lab Results  Component Value Date   NA 136 08/07/2022   K 4.2 08/07/2022   CL 102 08/07/2022   CO2 26 08/07/2022   GLUCOSE 142 (H) 08/07/2022   BUN 21 08/07/2022   CREATININE 1.28 (H) 08/07/2022   CALCIUM 9.2 08/07/2022    Studies/Results: DG THORACOLUMABAR SPINE  Result Date: 08/08/2022 CLINICAL DATA:  Fluoro guidance provided EXAM: THORACOLUMBAR SPINE - 2 VIEW FINDINGS: Dose: 7.3 mGy Fluoro time 46s IMPRESSION: C-arm fluoro guidance provided. Electronically Signed   By: Sammie Bench M.D.   On: 08/08/2022 18:01   DG C-Arm 1-60 Min-No Report  Result Date: 08/08/2022 Fluoroscopy was utilized by the requesting physician.  No radiographic interpretation.   DG C-Arm 1-60 Min-No Report  Result Date: 08/08/2022 Fluoroscopy was utilized by the requesting physician.  No radiographic interpretation.   MR LUMBAR SPINE WO CONTRAST  Result Date: 08/07/2022 CLINICAL DATA:  Difficulty ambulating, progressive EXAM: MRI LUMBAR SPINE WITHOUT CONTRAST TECHNIQUE:  Multiplanar, multisequence MR imaging of the lumbar spine was performed. No intravenous contrast was administered. COMPARISON:  09/28/2021 MRI lumbar spine and 07/15/2022 MRI thoracic spine FINDINGS: Segmentation: In keeping with previous numbering, there are 5 lumbar vertebral bodies with lumbarization of S1. Alignment: Unchanged mild dextroscoliosis. Fused trace retrolisthesis of L2 on L3, L3 on L4, and L4 on L5. 3 mm anterolisthesis L5 on S1, unchanged. Vertebrae: Status post L2-S2 fusion. No acute fracture, suspicious osseous lesion, or evidence of discitis in the lumbar spine. In the partially imaged T12 vertebral body, there is edema that appears similar to the 07/15/2022 MRI; the disc space is not included in the field of view. Conus medullaris and cauda equina: Conus extends to the L1-L2 level. Conus and cauda equina appear normal. Paraspinal and other soft tissues: Redemonstrated fluid collection in the laminectomy bed posterior to L3-L5, which measures up to 2.4 x 3.5 x 7.6 cm (AP x TR x CC) (series 8, image 17 and series 5, image 9), previously 3.5 x 2.2 x 7 6 cm, overall unchanged and likely chronic. Atrophy of the inferior paraspinous musculature. Multiple renal cysts in the left greater than right kidney, for which no follow-up is currently indicated. Disc levels: T12-L1: Seen only on the sagittal images. Minimal disc bulge. No significant spinal canal stenosis or neural foraminal narrowing. L1-L2: Disc height loss and moderate disc bulge. Severe left and moderate right facet arthropathy. Anteromedially directed left facet synovial cyst, which measures up to 7 mm (series 8, image 2), unchanged. Ligamentum flavum hypertrophy. Mild to moderate  spinal canal stenosis, unchanged. Narrowing of the lateral recesses. Mild bilateral neural foraminal narrowing. L2-L3: Status post fusion and decompression. No spinal canal stenosis or neural foraminal narrowing. L3-L4: Status post fusion and decompression. No  spinal canal stenosis or neural foraminal narrowing. L4-L5: Status post fusion and decompression. No spinal canal stenosis. Mild right neural foraminal narrowing. L5-S1: Grade 1 anterolisthesis with disc unroofing. Status post fusion. No spinal canal stenosis or neural foraminal narrowing. IMPRESSION: 1. The thoracic spine is not imaged and this time as the patient could not tolerate the exam. Partial visualization of the T12 vertebral body demonstrates persistent edema. 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 2. L1-L2 mild to moderate spinal canal stenosis and mild bilateral neural foraminal narrowing, unchanged. Narrowing of the lateral recesses at this level could affect the descending L2 nerve roots. 3. L4-L5 mild right neural foraminal narrowing. 4. Status post L2-S2 fusion and decompression, with no spinal canal stenosis or additional neural foraminal narrowing at these levels. Electronically Signed   By: Merilyn Baba M.D.   On: 08/07/2022 22:41    Assessment/Plan: Doing well, PT/OT for spastic gait, may or may not need rehab  Estimated body mass index is 20.6 kg/m as calculated from the following:   Height as of this encounter: '5\' 11"'$  (1.803 m).   Weight as of this encounter: 67 kg.    LOS: 2 days    Eustace Moore 08/09/2022, 8:08 AM

## 2022-08-09 NOTE — Evaluation (Signed)
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 He is s/p repeat thoracic T11-12 decompression with bilateral Hemi facetectomy and foraminotomies, posterolateral arthrodesis T11-12 utilizing local harvested morselized autologous bone graft and morselized allograft, and nonsegmental fixation T11-12 utilizing Alphatec pedicle screws on 08/07/22.  Pt with hx including arthritis, DDD, GERD, HLD, prior back sx  Clinical Impression  Pt admitted with above diagnosis. At baseline pt was independent with use of cane, but has had increased weakness past 3 weeks.  Today, pt reports feeling much better after surgery with good pain control.  He was able to transfers with supervision to min guard level and ambulated 120' with RW safely.  Pt has good support, DME, and ramp to enter home.  Required min cues for back precautions.  Pt will benefit from PT while admitted to progress, but demonstrates mobility necessary to return home with family when cleared by MD.  Pt currently with functional limitations due to the deficits listed below (see PT Problem List). Pt will benefit from skilled PT to increase their independence and safety with mobility to allow discharge to the venue listed below.          Recommendations for follow up therapy are one component of a multi-disciplinary discharge planning process, led by the attending physician.  Recommendations may be updated based on patient status, additional functional criteria and insurance authorization.  Follow Up Recommendations Outpatient PT      Assistance Recommended at Discharge Intermittent Supervision/Assistance  Patient can return home with the following  A little help with walking and/or  transfers;A little help with bathing/dressing/bathroom;Assistance with cooking/housework;Help with stairs or ramp for entrance    Equipment Recommendations None recommended by PT  Recommendations for Other Services       Functional Status Assessment Patient has had a recent decline in their functional status and demonstrates the ability to make significant improvements in function in a reasonable and predictable amount of time.     Precautions / Restrictions Precautions Precautions: Back Precaution Booklet Issued: Yes (comment) Precaution Comments: provided and educated on back precautions; No brace needed      Mobility  Bed Mobility Overal bed mobility: Needs Assistance Bed Mobility: Rolling, Sidelying to Sit, Sit to Sidelying Rolling: Min guard Sidelying to sit: Min guard     Sit to sidelying: Min guard General bed mobility comments: min guard for tactile cues for log roll technique    Transfers Overall transfer level: Needs assistance Equipment used: Rolling walker (2 wheels) Transfers: Sit to/from Stand Sit to Stand: Supervision                Ambulation/Gait Ambulation/Gait assistance: Min guard, Supervision Gait Distance (Feet): 150 Feet Assistive device: Rolling walker (2 wheels) Gait Pattern/deviations: Step-through pattern Gait velocity: normal     General Gait Details: Started min guard progressed to supervision.  Steady gait no LOB.  Good foot clearance almost with increased dorsiflexion/heel toe gait as pt trying to compensate.  Does have "hard " steps at times suspect due to decreased sensation feet  Stairs            Wheelchair Mobility    Modified Rankin (Stroke Patients Only)       Balance Overall balance assessment: Needs assistance Sitting-balance support: No upper extremity supported Sitting  balance-Leahy Scale: Good     Standing balance support: No upper extremity supported, Bilateral upper extremity supported Standing  balance-Leahy Scale: Fair Standing balance comment: could stand without AD: RW to ambulate                             Pertinent Vitals/Pain Pain Assessment Pain Assessment: 0-10 Pain Score: 1  Pain Location: back Pain Descriptors / Indicators: Discomfort Pain Intervention(s): Limited activity within patient's tolerance, Monitored during session    Home Living Family/patient expects to be discharged to:: Private residence Living Arrangements: Spouse/significant other Available Help at Discharge: Family;Available 24 hours/day Type of Home: House Home Access: Ramped entrance   Entrance Stairs-Number of Steps: 2   Home Layout: Two level;Able to live on main level with bedroom/bathroom Home Equipment: Grab bars - tub/shower;BSC/3in1;Rolling Walker (2 wheels);Rollator (4 wheels);Wheelchair - manual Additional Comments: no animals,    Prior Function Prior Level of Function : Needs assist             Mobility Comments: Pt reports 3 weeks ago walking with cane in community; no falls ADLs Comments: independent with adls; did sit for showers using BSC     Hand Dominance        Extremity/Trunk Assessment   Upper Extremity Assessment Upper Extremity Assessment: Overall WFL for tasks assessed    Lower Extremity Assessment Lower Extremity Assessment: LLE deficits/detail;RLE deficits/detail RLE Deficits / Details: ROM WFL; MMT: gentle MMT knee ext 4+/5; ankle DF 5/5 RLE Sensation: decreased light touch (bil feet) LLE Deficits / Details: ROM WFL; MMT: gentle MMT knee ext 4+/5; ankle DF 5/5 LLE Sensation: decreased light touch (bil feet)    Cervical / Trunk Assessment Cervical / Trunk Assessment: Kyphotic  Communication   Communication: HOH (wears hearing aides)  Cognition Arousal/Alertness: Awake/alert Behavior During Therapy: WFL for tasks assessed/performed Overall Cognitive Status: Within Functional Limits for tasks assessed                                  General Comments: able to recall precautions at end of session; min cues with precautions for transfers        General Comments General comments (skin integrity, edema, etc.): VSS    Exercises     Assessment/Plan    PT Assessment Patient needs continued PT services  PT Problem List Decreased strength;Pain;Decreased activity tolerance;Decreased knowledge of use of DME;Decreased balance;Decreased safety awareness;Decreased mobility;Decreased knowledge of precautions       PT Treatment Interventions DME instruction;Therapeutic exercise;Gait training;Balance training;Stair training;Neuromuscular re-education;Modalities;Functional mobility training;Therapeutic activities;Patient/family education    PT Goals (Current goals can be found in the Care Plan section)  Acute Rehab PT Goals Patient Stated Goal: return home PT Goal Formulation: With patient Time For Goal Achievement: 08/23/22 Potential to Achieve Goals: Good    Frequency Min 5X/week     Co-evaluation               AM-PAC PT "6 Clicks" Mobility  Outcome Measure Help needed turning from your back to your side while in a flat bed without using bedrails?: A Little Help needed moving from lying on your back to sitting on the side of a flat bed without using bedrails?: A Little Help needed moving to and from a bed to a chair (including a wheelchair)?: A Little Help needed standing up from a chair using your arms (e.g., wheelchair or  bedside chair)?: A Little Help needed to walk in hospital room?: A Little Help needed climbing 3-5 steps with a railing? : A Little 6 Click Score: 18    End of Session Equipment Utilized During Treatment: Gait belt Activity Tolerance: Patient tolerated treatment well Patient left: in bed;with call bell/phone within reach;with bed alarm set;with SCD's reapplied Nurse Communication: Mobility status PT Visit Diagnosis: Other abnormalities of gait and mobility (R26.89);Muscle  weakness (generalized) (M62.81)    Time: 8675-4492 PT Time Calculation (min) (ACUTE ONLY): 23 min   Charges:   PT Evaluation $PT Eval Low Complexity: 1 Low PT Treatments $Therapeutic Activity: 8-22 mins        Abran Richard, PT Acute Rehab [facility] Re[facility] [phone]   Karlton Lemon 08/09/2022, 11:02 AM

## 2022-08-09 NOTE — Plan of Care (Signed)
Patient ready for discharge with no s/s of acute distress. Surgical site remains C/D/I. Cleared by physical and occupational therapy for discharge home. Discharge instructions provided verbally with patient, Wife and Daughter-In-Law; provided in AVS. Verbalizes readiness to go home.   Problem: Education: Goal: Knowledge of General Education information will improve Description: Including pain rating scale, medication(s)/side effects and non-pharmacologic comfort measures Outcome: Adequate for Discharge   Problem: Health Behavior/Discharge Planning: Goal: Ability to manage health-related needs will improve Outcome: Adequate for Discharge   Problem: Clinical Measurements: Goal: Ability to maintain clinical measurements within normal limits will improve Outcome: Adequate for Discharge Goal: Will remain free from infection Outcome: Adequate for Discharge Goal: Diagnostic test results will improve Outcome: Adequate for Discharge Goal: Respiratory complications will improve Outcome: Adequate for Discharge Goal: Cardiovascular complication will be avoided Outcome: Adequate for Discharge   Problem: Activity: Goal: Risk for activity intolerance will decrease Outcome: Adequate for Discharge   Problem: Nutrition: Goal: Adequate nutrition will be maintained Outcome: Adequate for Discharge   Problem: Coping: Goal: Level of anxiety will decrease Outcome: Adequate for Discharge   Problem: Elimination: Goal: Will not experience complications related to bowel motility Outcome: Adequate for Discharge Goal: Will not experience complications related to urinary retention Outcome: Adequate for Discharge   Problem: Pain Managment: Goal: General experience of comfort will improve Outcome: Adequate for Discharge   Problem: Safety: Goal: Ability to remain free from injury will improve Outcome: Adequate for Discharge   Problem: Skin Integrity: Goal: Risk for impaired skin integrity will  decrease Outcome: Adequate for Discharge   Problem: Education: Goal: Ability to verbalize activity precautions or restrictions will improve Outcome: Adequate for Discharge Goal: Knowledge of the prescribed therapeutic regimen will improve Outcome: Adequate for Discharge Goal: Understanding of discharge needs will improve Outcome: Adequate for Discharge   Problem: Activity: Goal: Ability to avoid complications of mobility impairment will improve Outcome: Adequate for Discharge Goal: Ability to tolerate increased activity will improve Outcome: Adequate for Discharge Goal: Will remain free from falls Outcome: Adequate for Discharge   Problem: Bowel/Gastric: Goal: Gastrointestinal status for postoperative course will improve Outcome: Adequate for Discharge   Problem: Clinical Measurements: Goal: Ability to maintain clinical measurements within normal limits will improve Outcome: Adequate for Discharge Goal: Postoperative complications will be avoided or minimized Outcome: Adequate for Discharge Goal: Diagnostic test results will improve Outcome: Adequate for Discharge   Problem: Pain Management: Goal: Pain level will decrease Outcome: Adequate for Discharge   Problem: Skin Integrity: Goal: Will show signs of wound healing Outcome: Adequate for Discharge   Problem: Health Behavior/Discharge Planning: Goal: Identification of resources available to assist in meeting health care needs will improve Outcome: Adequate for Discharge   Problem: Bladder/Genitourinary: Goal: Urinary functional status for postoperative course will improve Outcome: Adequate for Discharge

## 2022-08-09 NOTE — Evaluation (Signed)
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 He is s/p repeat thoracic posterolateral arthrodesis T11-12 decompression with bilateral Hemi facetectomy and foraminotomies, T11-12 utilizing local harvested morselized autologous bone graft and morselized allograft, and nonsegmental fixation T11-12 utilizing Alphatec pedicle screws on 08/07/22.  PMH arthritis, DDD, GERD, HLD, prior back sx   Clinical Impression   Patient is s/p T11-12 fusion surgery resulting in functional limitations due to the deficits listed below (see OT problem list). Pt currently with mild balance deficits with adls. Pt educated on cup method seated for grooming task as pt describes being dependent on the wall for stability. Pt with excellent return demo. Pt with dressing this session to help with adhering to back precautions. Pt benefits from parallel pillows along spine and sitting on pillow for comfort in the recliner.  Patient will benefit from skilled OT acutely to increase independence and safety with ADLS to allow discharge Brock.       Recommendations for follow up therapy are one component of a multi-disciplinary discharge planning process, led by the attending physician.  Recommendations may be updated based on patient status, additional functional criteria and insurance authorization.   Follow Up Recommendations  Home health OT     Assistance Recommended at Discharge Set up Supervision/Assistance  Patient can return home with the following A little help with bathing/dressing/bathroom    Functional Status Assessment  Patient has had a recent decline in their functional status and demonstrates the ability to make significant improvements in function in  a reasonable and predictable amount of time.  Equipment Recommendations  Other (comment) (RW)    Recommendations for Other Services       Precautions / Restrictions Precautions Precautions: Back Precaution Booklet Issued: Yes (comment) Precaution Comments: handout for adls with back precautions present      Mobility Bed Mobility Overal bed mobility: Needs Assistance Bed Mobility: Rolling, Supine to Sit Rolling: Min guard Sidelying to sit: Min guard       General bed mobility comments: cues for back precautions and bringing LE off eob . pt with great return demo    Transfers Overall transfer level: Needs assistance Equipment used: Rolling walker (2 wheels) Transfers: Sit to/from Stand Sit to Stand: Supervision           General transfer comment: cues for hand placement and not to pull on RW      Balance Overall balance assessment: Needs assistance Sitting-balance support: No upper extremity supported, Feet supported Sitting balance-Leahy Scale: Good     Standing balance support: Bilateral upper extremity supported, During functional activity, Reliant on assistive device for balance Standing balance-Leahy Scale: Fair Standing balance comment: pt educated on sitting due to safety with bil hand use during adls                           ADL either performed or assessed with clinical judgement   ADL Overall ADL's : Needs assistance/impaired Eating/Feeding: Independent   Grooming: Oral care;Set up;Sitting Grooming Details (indicate cue type and reason): educated on sitting with cup method as pt describes leaning into a wall - wedge into place for safety         Upper Body Dressing : Modified independent   Lower Body Dressing: Supervision/safety;Sit  to/from stand;Cueing for back precautions Lower Body Dressing Details (indicate cue type and reason): pt able to figure 4 crossed and educated on reason for the back precautions Toilet Transfer: Min  Dispensing optician Details (indicate cue type and reason): requires use of hands for all transfers off surfaces         Functional mobility during ADLs: Min guard;Rolling walker (2 wheels) General ADL Comments: pt provided a higher RW to help with posture and stability. pt with good return demo with new RW height     Vision Baseline Vision/History: 1 Wears glasses Patient Visual Report: No change from baseline       Perception     Praxis      Pertinent Vitals/Pain Pain Assessment Pain Assessment: 0-10 Pain Score: 1  Pain Location: back Pain Descriptors / Indicators: Discomfort Pain Intervention(s): Monitored during session, Premedicated before session, Repositioned     Hand Dominance Right   Extremity/Trunk Assessment Upper Extremity Assessment Upper Extremity Assessment: Overall WFL for tasks assessed   Lower Extremity Assessment Lower Extremity Assessment: Defer to PT evaluation RLE Deficits / Details: ROM WFL; MMT: gentle MMT knee ext 4+/5; ankle DF 5/5 RLE Sensation: decreased light touch (bil feet) LLE Deficits / Details: ROM WFL; MMT: gentle MMT knee ext 4+/5; ankle DF 5/5 LLE Sensation: decreased light touch (bil feet)   Cervical / Trunk Assessment Cervical / Trunk Assessment: Kyphotic   Communication Communication Communication: HOH (wears hearing aides)   Cognition Arousal/Alertness: Awake/alert Behavior During Therapy: WFL for tasks assessed/performed Overall Cognitive Status: Within Functional Limits for tasks assessed                                       General Comments  VSS, incision dry and intact. pt educated on dressing and avoid washing on top of the incision    Exercises     Shoulder Instructions      Home Living Family/patient expects to be discharged to:: Private residence Living Arrangements: Spouse/significant other Available Help at Discharge: Family;Available 24 hours/day Type of Home: House Home  Access: Ramped entrance Entrance Stairs-Number of Steps: 2   Home Layout: Two level;Able to live on main level with bedroom/bathroom     Bathroom Shower/Tub: Occupational psychologist: Standard     Home Equipment: Grab bars - tub/shower;BSC/3in1;Rolling Environmental consultant (2 wheels);Rollator (4 wheels);Wheelchair - manual   Additional Comments: no animals,      Prior Functioning/Environment Prior Level of Function : Needs assist             Mobility Comments: Pt reports 3 weeks ago walking with cane in community; no falls ADLs Comments: independent with adls; did sit for showers using BSC        OT Problem List: Decreased strength;Decreased activity tolerance;Impaired balance (sitting and/or standing);Decreased safety awareness;Decreased knowledge of precautions      OT Treatment/Interventions: Self-care/ADL training;Therapeutic exercise;DME and/or AE instruction;Energy conservation;Manual therapy;Therapeutic activities;Patient/family education;Balance training    OT Goals(Current goals can be found in the care plan section) Acute Rehab OT Goals Patient Stated Goal: to go home OT Goal Formulation: With patient Time For Goal Achievement: 08/23/22 Potential to Achieve Goals: Good  OT Frequency: Min 2X/week    Co-evaluation              AM-PAC OT "6 Clicks" Daily Activity     Outcome Measure Help from another person eating meals?: None  Help from another person taking care of personal grooming?: None Help from another person toileting, which includes using toliet, bedpan, or urinal?: A Little Help from another person bathing (including washing, rinsing, drying)?: A Little Help from another person to put on and taking off regular upper body clothing?: None Help from another person to put on and taking off regular lower body clothing?: A Little 6 Click Score: 21   End of Session Equipment Utilized During Treatment: Rolling walker (2 wheels) Nurse Communication:  Mobility status;Precautions  Activity Tolerance: Patient tolerated treatment well Patient left: in chair;with call bell/phone within reach;with chair alarm set  OT Visit Diagnosis: Unsteadiness on feet (R26.81);Muscle weakness (generalized) (M62.81)                Time: 2449-7530 OT Time Calculation (min): 57 min Charges:  OT General Charges $OT Visit: 1 Visit OT Evaluation $OT Eval Moderate Complexity: 1 Mod OT Treatments $Self Care/Home Management : 23-37 mins   Brynn, OTR/L  Acute Rehabilitation Services Office: [phone] .   Jeri Modena 08/09/2022, 12:31 PM

## 2022-08-09 NOTE — Care Management Important Message (Signed)
Important Message  Patient Details  Name: Glen [name] MRN: [id number] Date of Birth: 1936/06/26   Medicare Important Message Given:  Yes     Hannah Beat 08/09/2022, 12:46 PM

## 2022-08-10 NOTE — Anesthesia Postprocedure Evaluation (Signed)
Anesthesia Post Note  Patient: Glen [name]  Procedure(s) Performed: POSTERIOR LATERAL ARTHRODESIS Thoracic eleven-twelve with Pedicle Screw Fixation and Decompression (Back)     Patient location during evaluation: PACU Anesthesia Type: General Level of consciousness: awake and alert Pain management: pain level controlled Vital Signs Assessment: post-procedure vital signs reviewed and stable Respiratory status: spontaneous breathing, nonlabored ventilation, respiratory function stable and patient connected to nasal cannula oxygen Cardiovascular status: blood pressure returned to baseline and stable Postop Assessment: no apparent nausea or vomiting Anesthetic complications: no   No notable events documented.  Last Vitals:  Vitals:   08/09/22 0537 08/09/22 0745  BP: (!) 113/57 (!) 145/63  Pulse: 63 (!) 57  Resp: 18 20  Temp: 36.4 C 36.5 C  SpO2: 97% 100%    Last Pain:  Vitals:   08/09/22 1030  TempSrc:   PainSc: [place]

## 2022-08-13 ENCOUNTER — Telehealth: Payer: Self-pay | Admitting: Neurology

## 2022-08-13 ENCOUNTER — Encounter (HOSPITAL_COMMUNITY): Payer: Self-pay | Admitting: Neurological Surgery

## 2022-08-13 NOTE — Telephone Encounter (Signed)
Pt is calling. Stated he wants to know if he needs to still have the nerve conduction study since he had surgery. Pt is requesting a call from nurse.

## 2022-08-13 NOTE — Telephone Encounter (Signed)
Called pt back. Relayed Dr. Garth Bigness message. He verbalized understanding. Cx appt 09/05/22 for NCS. He is doing well post op and will call if he needs anything else moving forward.

## 2022-09-05 ENCOUNTER — Encounter: Payer: Medicare PPO | Admitting: Neurology

## 2022-09-10 ENCOUNTER — Encounter: Payer: Medicare PPO | Admitting: Physical Medicine & Rehabilitation

## 2022-10-24 ENCOUNTER — Other Ambulatory Visit: Payer: Self-pay

## 2022-10-24 MED ORDER — TOLTERODINE TARTRATE ER 2 MG PO CP24: 2.0000 mg | ORAL_CAPSULE | Freq: Every day | ORAL | 0 refills | Status: DC

## 2022-11-12 ENCOUNTER — Telehealth: Payer: Self-pay | Admitting: Neurology

## 2022-11-12 NOTE — Telephone Encounter (Signed)
Please call pt back. Rx already sent into this pharmacy on 10/24/22 for qty 90 (90 days supply)

## 2022-11-12 NOTE — Telephone Encounter (Signed)
Pt is needing a refill on his  tolterodine (DETROL LA) 2 MG 24 hr capsule and is needing it sent to the [facility] in [place]

## 2022-12-04 ENCOUNTER — Ambulatory Visit: Payer: Medicare PPO | Admitting: Neurology

## 2022-12-04 ENCOUNTER — Encounter: Payer: Self-pay | Admitting: Neurology

## 2022-12-04 VITALS — BP 132/80 | HR 63 | Ht 70.5 in | Wt 159.8 lb

## 2022-12-04 DIAGNOSIS — M4804 Spinal stenosis, thoracic region: Secondary | ICD-10-CM

## 2022-12-04 DIAGNOSIS — R200 Anesthesia of skin: Secondary | ICD-10-CM

## 2022-12-04 DIAGNOSIS — R269 Unspecified abnormalities of gait and mobility: Secondary | ICD-10-CM

## 2022-12-04 DIAGNOSIS — R3915 Urgency of urination: Secondary | ICD-10-CM

## 2022-12-04 DIAGNOSIS — G629 Polyneuropathy, unspecified: Secondary | ICD-10-CM

## 2022-12-04 DIAGNOSIS — R252 Cramp and spasm: Secondary | ICD-10-CM

## 2022-12-04 MED ORDER — TAMSULOSIN HCL 0.4 MG PO CAPS: 0.4000 mg | ORAL_CAPSULE | Freq: Every day | ORAL | 3 refills | Status: DC

## 2022-12-04 MED ORDER — TOLTERODINE TARTRATE ER 2 MG PO CP24: 2.0000 mg | ORAL_CAPSULE | Freq: Every day | ORAL | 3 refills | Status: DC

## 2022-12-04 NOTE — Progress Notes (Signed)
[facility]  PATIENT: ANGELA PLATNER DOB: [birth year]  REFERRING DOCTOR OR PCP:  Aggie Cosier, MD SOURCE: Patient, notes from primary care, imaging reports, laboratory reports, CT scan images personally reviewed.  _________________________________   HISTORICAL  CHIEF COMPLAINT:  Chief Complaint  Patient presents with   Follow-up    Pt in room 10. Here for up Thoracic myelopathy, using walker. Pt neuropathy in feet is slight better. Now taking Duloxetine 20 mg x 2 months now.    HISTORY OF PRESENT ILLNESS:  Krishav Mamone is a 87 y.o. man with gait difficulty  Update  12/04/2022: Due to worsening gait and bladder function at the last visit, I was concerned about compression and re-imaged the thoracic spine which showed severe stenosis.  We quickly referred back to Dr. Yetta Barre who re-operated.    He is much better now.    Gait is doing better after the second surgery surgery and he saw Dr. Yetta Barre recently.  In his house he uses walls for balance and less commonly his cane.   He usually uses a cane (walker when he knows he will b going further.  With a cane he walked 1/4 mile recently which is getting lose to where he was before the second operation.  His bladder is also worse.  He has urge incontinence and he has hesitancy  He is noting more numbness and heaviness in his   History from initial consultation: He is an 87 year old man who has noted weakness January 2023.   Along with the weakness, he also notes gait was slower.   He was waling 3 miles daily in 2022.   Starting December 2022, he would get a sensation in both legs that would occur with standing or walking but not if he is sitting.   He went from walking well to a cane to a walker over the last few weeks.    The right leg is weaker than his left.  He has numbness on both feet  He had noted mild toe numbness x several years but the leg dysesthesia is new.   He denies any numbnes, pain or weakness in his  arms.   He is needing to urina more frequently and he has had a little incontinence over the last month  He has a h/o spinal issues.   He had right sciatica type pain.   Prompting surgery in 2018.  He had PLIF L2-S2 in 2018 CT 05/08/2017 showed mild spinal stenosis at L2L3, L3L4, L4L5 and L5S1 but significant foraminal narrowing at ost of these levels.   He had grade 1 anterolisthesis at L5S1.   In T spine he had mild spinal stenosis associated with linear calcifications at T8T9.     Lumbar xrays 05/27/2017 show PLIF from L2-S2.    MRIs 09/15/2021 showed significant progressive degenerative changes at T11-T12 with myelopathic signal within the spinal cord.  He was referred to Dr. Yetta Barre who did surgery at these levels in February 2023  Imaging 09/15/2021 had shown thoracic myelopathy and he was referred to neurosurgery.  He had spinal surgery 10/01/2021 and had some improvement of his gait.Marland Kitchen      He had an MRI of the thoracic spine 07/15/2022.  It shows fluid in the T11-T12 disc with endplate irregularity/bone marrow edema.  There is still moderately severe spinal stenosis.  There are some increased signal within the spinal cord.   The T11-T12 level has some concern for ongoing discitis a new T2 hyperintense lobulated focus  is seen near the anterior right paraspinal soft tissue at T12 and abscess cannot be ruled out.  He had repeat neurosurgery 08/07/2022 (Dr. Yetta Barre)   Imaging MRI of the thoracic spine 07/15/2022.  It shows fluid in the T11-T12 disc with endplate irregularity/bone marrow edema.  There is still moderately severe spinal stenosis.  There are some increased signal within the spinal cord.   The T11-T12 level has some concern for ongoing discitis a new T2 hyperintense lobulated focus is seen near the anterior right paraspinal soft tissue at T12 and abscess cannot be ruled out.   MRI of the brain 09/15/2021 shows mild to moderate generalized cortical atrophy with the ventricles proportionate to the  extent of atrophy.  The brain parenchyma was normal for age.    MRI of the cervical spine 09/15/2021 showed normal spinal cord.  There were degenerative changes that could affect the right C5 nerve root at C4-C5 and mild spinal stenosis at C5-C6 but no significant spinal stenosis.  MRI of the thoracic spine 09/15/2021 showed myelopathic signal within the spinal cord at T11-T12.  At that level there is moderately severe spinal stenosis due to disc protrusion, endplate spurring and facet hypertrophy.  There was edema at the endplates.  At T8-T9, there were degenerative changes corresponding to the calcifications noted on the CT scan from 2018  Labs 05/30/2021:  TSH, T4 hemoglobin, white blood cell were normal. Cholesterol was 172 and HDL was 101 Creatinine was elevated at 1.34.  Hemoglobin A1c borderline at 5 9, glucose 109  B12 3554 (193-986); folate elevated at 30.8 (3.1-17.5)   REVIEW OF SYSTEMS: Constitutional: No fevers, chills, sweats, or change in appetite Eyes: No visual changes, double vision, eye pain Ear, nose and throat: No hearing loss, ear pain, nasal congestion, sore throat Cardiovascular: No chest pain, palpitations Respiratory:  No shortness of breath at rest or with exertion.   No wheezes GastrointestinaI: No nausea, vomiting, diarrhea, abdominal pain, fecal incontinence Genitourinary:  No dysuria, urinary retention or frequency.  No nocturia. Musculoskeletal:  No neck pain, back pain Integumentary: No rash, pruritus, skin lesions Neurological: as above Psychiatric: No depression at this time.  No anxiety Endocrine: No palpitations, diaphoresis, change in appetite, change in weigh or increased thirst Hematologic/Lymphatic:  No anemia, purpura, petechiae. Allergic/Immunologic: No itchy/runny eyes, nasal congestion, recent allergic reactions, rashes  ALLERGIES: Allergies  Allergen Reactions   Diphenhydramine Anxiety and Other (See Comments)    Agitation Jittery feeling    Neurontin [Gabapentin] Other (See Comments)    "Mental status/awareness changes"    HOME MEDICATIONS:  Current Outpatient Medications:    Coenzyme Q10 (CO Q-10 PO), Take 2 capsules by mouth 2 (two) times daily., Disp: , Rfl:    DULoxetine (CYMBALTA) 20 MG capsule, Take 20 mg by mouth 2 (two) times daily., Disp: , Rfl:    losartan (COZAAR) 50 MG tablet, Take 50 mg by mouth in the morning., Disp: , Rfl:    meloxicam (MOBIC) 15 MG tablet, Take 7.5 mg by mouth at bedtime., Disp: , Rfl:    Multiple Vitamin (MULTIVITAMIN) tablet, Take 1 tablet by mouth at bedtime., Disp: , Rfl:    Omega-3 Fatty Acids (FISH OIL PO), Take 2 capsules by mouth at bedtime., Disp: , Rfl:    metoprolol tartrate (LOPRESSOR) 25 MG tablet, Take 0.5 tablets (12.5 mg total) by mouth 2 (two) times daily. (Patient taking differently: Take 12.5 mg by mouth at bedtime.), Disp: 60 tablet, Rfl: 2   tamsulosin (FLOMAX) 0.4 MG CAPS  capsule, Take 1 capsule (0.4 mg total) by mouth daily., Disp: 90 capsule, Rfl: 3   tolterodine (DETROL LA) 2 MG 24 hr capsule, Take 1 capsule (2 mg total) by mouth daily., Disp: 90 capsule, Rfl: 3  PAST MEDICAL HISTORY: Past Medical History:  Diagnosis Date   Allergy    Arthritis    Cataracts, bilateral    bil cataracts removed   DDD (degenerative disc disease), lumbosacral    pain radiates down left leg   Environmental and seasonal allergies    GERD (gastroesophageal reflux disease)    Hearing loss    bilateral hearing aids   Heart murmur    recently  saw cardiologist and said it was fine    Hx of adenomatous colonic polyps 04/23/2016   Hyperlipidemia    Wound dehiscence 05/31/2017    PAST SURGICAL HISTORY: Past Surgical History:  Procedure Laterality Date   APPLICATION OF ROBOTIC ASSISTANCE FOR SPINAL PROCEDURE N/A 05/27/2017   Procedure: APPLICATION OF ROBOTIC ASSISTANCE FOR SPINAL PROCEDURE;  Surgeon: Ditty, Loura Halt, MD;  Location: [facility] OR;  Service: Neurosurgery;  Laterality:  N/A;   cataract     bilat   CIRCUMCISION     COLONOSCOPY     COLONOSCOPY W/ POLYPECTOMY     LAMINECTOMY  05/27/2017   SACRAL   LAMINECTOMY WITH POSTERIOR LATERAL ARTHRODESIS LEVEL 1 N/A 08/08/2022   Procedure: POSTERIOR LATERAL ARTHRODESIS Thoracic eleven-twelve with Pedicle Screw Fixation and Decompression;  Surgeon: Tia Alert, MD;  [facility] OR;  Service: Neurosurgery;  Laterality: N/A;   LUMBAR WOUND DEBRIDEMENT N/A 05/31/2017   Procedure: LUMBAR WOUND REVISION;  Surgeon: Tia Alert, MD;  Location: [facility] OR;  Service: Neurosurgery;  Laterality: N/A;   THORACIC DISCECTOMY N/A 10/01/2021   Procedure: T11-12 Decompression with possible instrumented fusion;  Surgeon: Tia Alert, MD;  Location: [facility] OR;  Service: Neurosurgery;  Laterality: N/A;   TONSILLECTOMY     wisdom teth extraction      FAMILY HISTORY: Family History  Problem Relation Age of Onset   Alzheimer's disease Father    Stroke Brother    Colon cancer Neg Hx    Esophageal cancer Neg Hx    Rectal cancer Neg Hx    Stomach cancer Neg Hx    Pancreatic cancer Neg Hx    Prostate cancer Neg Hx     SOCIAL HISTORY:  Social History   Socioeconomic History   Marital status: Married    Spouse name: Bonita Quin   Number of children: 2   Years of education: Not on file   Highest education level: Professional school degree (e.g., MD, DDS, DVM, JD)  Occupational History   Not on file  Tobacco Use   Smoking status: Former    Packs/day: 1    Types: Cigarettes    Quit date: 08/02/2016    Years since quitting: 6.3   Smokeless tobacco: Never  Vaping Use   Vaping Use: Never used  Substance and Sexual Activity   Alcohol use: Yes    Alcohol/week: 7.0 standard drinks of alcohol    Types: 7 Glasses of wine per week   Drug use: No   Sexual activity: Not on file  Other Topics Concern   Not on file  Social History Narrative   Married (wife dx Parkinson's 2019-20)   1 daughter   1 son   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 [employer]   Former smoker, no tobacco or drugs  now, 7 drinks/week   Has a law degree   Social Determinants of Health   Financial Resource Strain: Not on file  Food Insecurity: No Food Insecurity (08/08/2022)   Hunger Vital Sign    Worried About Running Out of Food in the Last Year: Never true    Ran Out of Food in the Last Year: Never true  Transportation Needs: No Transportation Needs (08/08/2022)   PRAPARE - Administrator, Civil Service (Medical): No    Lack of Transportation (Non-Medical): No  Physical Activity: Not on file  Stress: Not on file  Social Connections: Not on file  Intimate Partner Violence: Not At Risk (08/08/2022)   Humiliation, Afraid, Rape, and Kick questionnaire    Fear of Current or Ex-Partner: No    Emotionally Abused: No    Physically Abused: No    Sexually Abused: No     PHYSICAL EXAM  Vitals:   12/04/22 0811  BP: 132/80  Pulse: 63  Weight: 159 lb 12.8 oz (72.5 kg)  Height: 5' 10.5" (1.791 m)    Body mass index is 22.6 kg/m.   General: The patient is well-developed and well-nourished and in no acute distress  HEENT:  Head is [place]/AT.  Sclera are anicteric.     Neck/back: No carotid bruits are noted.  The neck is nontender with mildly reduced range of motion.  He has a well-healed scar over the lower thoracic spine and lumbar spine  Neurologic Exam  Mental status: The patient is alert and oriented x 3 at the time of the examination. The patient has apparent normal recent and remote memory, with an apparently normal attention span and concentration ability.   Speech is normal.  Cranial nerves: Extraocular movements are full.  Facial strength and sensation was normal.  No dysarthria is noted.  Mildly reduced hearing.  Motor:  Muscle bulk is normal.   Tone is normal. Strength is  5 / 5 in arms, 4+/5 EHL  Sensory:  He has normal sensation in arms and proximal legs.   He had moderate  reduced vibration sensation at the knee and near absent at ankles  .  Pinprick and touch sensation were fairly normal.  Coordination: Cerebellar testing reveals good finger-nose-finger and reduced heel-to-shin bilaterally.  Gait and station: He needs to use both arms to rise from the wheelchair.  Spastic gait without a cane with good stride but reduced balance turn.  Marland KitchenMarland KitchenMarland KitchenRomberg is positive.  Reflexes: Deep tendon reflexes are symmetric and normal in arms, 3 at knees and 3 right and  2 left ankle.        ASSESSMENT AND PLAN  Gait disturbance  Numbness  Neuropathy  Thoracic spinal stenosis  Urinary urgency  Spasticity  He is doing well after the second surgery.   Stay active try to walk 1 mile or more every day (with cane or walker as needed) Continue Cymbalta for dysesthetic pain, Detrol/Flomax for bladder. Return as needed for new or worsening neurologic symptoms.   Offie Waide A. Epimenio Foot, MD, [facility] 12/04/2022, 8:58 AM Certified in Neurology, Clinical Neurophysiology, Sleep Medicine and Neuroimaging  [facility] [address], [address] [place], [place] [address] [phone]

## 2022-12-06 ENCOUNTER — Other Ambulatory Visit: Payer: Self-pay | Admitting: Neurology

## 2022-12-09 NOTE — Telephone Encounter (Signed)
Last seen on 12/04/22 per note "Continue Cymbalta for dysesthetic pain, Detrol/Flomax for bladder. " No follow up scheduled  Last filled on 09/24/22 # 90 tablets(90 day supply)

## 2022-12-13 ENCOUNTER — Other Ambulatory Visit: Payer: Self-pay | Admitting: Neurology

## 2022-12-17 NOTE — Telephone Encounter (Signed)
Rx as last filled on 12/04/22 #90 tablets with 3 refills this a duplicate refill request.

## 2023-12-31 ENCOUNTER — Other Ambulatory Visit: Payer: Self-pay | Admitting: Neurology

## 2023-12-31 NOTE — Telephone Encounter (Signed)
 Last seen on 12/04/22 No follow up scheduled

## 2024-01-26 ENCOUNTER — Telehealth: Payer: Self-pay | Admitting: Neurology

## 2024-01-26 ENCOUNTER — Other Ambulatory Visit: Payer: Self-pay | Admitting: Neurology

## 2024-01-26 NOTE — Telephone Encounter (Signed)
 Pt called to Schedule appt .  Appt Scheduled

## 2024-01-27 NOTE — Telephone Encounter (Signed)
 Last seen on 12/04/22 Follow up scheduled on 08/18/24

## 2024-03-08 ENCOUNTER — Other Ambulatory Visit: Payer: Self-pay | Admitting: Neurology

## 2024-03-08 NOTE — Telephone Encounter (Signed)
 Last seen on 12/04/22 Follow up scheduled on 08/18/24

## 2024-05-31 ENCOUNTER — Other Ambulatory Visit: Payer: Self-pay | Admitting: Neurology

## 2024-06-01 NOTE — Telephone Encounter (Signed)
 Last seen on 12/04/22 Follow up scheduled 08/18/24

## 2024-06-21 ENCOUNTER — Other Ambulatory Visit: Payer: Self-pay | Admitting: Neurology

## 2024-08-18 ENCOUNTER — Encounter: Payer: Self-pay | Admitting: Neurology

## 2024-08-18 ENCOUNTER — Ambulatory Visit: Admitting: Neurology

## 2024-08-18 VITALS — BP 130/86 | HR 69 | Resp 15 | Ht 71.0 in | Wt 169.0 lb

## 2024-08-18 DIAGNOSIS — R200 Anesthesia of skin: Secondary | ICD-10-CM

## 2024-08-18 DIAGNOSIS — R3915 Urgency of urination: Secondary | ICD-10-CM

## 2024-08-18 DIAGNOSIS — R252 Cramp and spasm: Secondary | ICD-10-CM

## 2024-08-18 DIAGNOSIS — R269 Unspecified abnormalities of gait and mobility: Secondary | ICD-10-CM

## 2024-08-18 DIAGNOSIS — M4804 Spinal stenosis, thoracic region: Secondary | ICD-10-CM | POA: Diagnosis not present

## 2024-08-18 DIAGNOSIS — G629 Polyneuropathy, unspecified: Secondary | ICD-10-CM | POA: Diagnosis not present

## 2024-08-18 NOTE — Progress Notes (Signed)
 "  [facility]  PATIENT: Glen [name] DOB: 10-Dec-1935  REFERRING DOCTOR OR PCP:  Karie Gore, MD SOURCE: Patient, notes from primary care, imaging reports, laboratory reports, CT scan images personally reviewed.  _________________________________   HISTORICAL  CHIEF COMPLAINT:  Chief Complaint  Patient presents with   Gait Problem    Rm10, alone, Gait disturbance-f/u      HISTORY OF PRESENT ILLNESS:  Glen [name] is a 88 y.o. man with gait difficulty  Update  08/18/2024: He feels his gait is poor.   He improved some after his most recent T-spine operation (Dr Joshua [phone]) but feels he has progressed some since.   He can walk about 1 mile without support but then he needs rest due to pain.  This is similar to what he was doing in 2024 at his last visit.  He had one fall in last year.   Due to worsening gait and bladder function at the last visit, I was concerned about compression and re-imaged the thoracic spine which showed severe stenosis.  We quickly referred back to Dr. Joshua who re-operated.    He is much better now.    Gait is doing better after the second surgery surgery and he saw Dr. Joshua recently.  He no longer uses a cane and can walk 1 mile.    He has not returned to playing golf,    He has some urinary frequency and rare urge incontinence - last time about 3 months ago.    He notes mild short term memory issues - especially names.   He usually recalls with hints.  History from initial consultation: He noted weakness January 2023.   Along with the weakness, he also noted gait was slower.   He was waling 3 miles daily in 2022.   Starting December 2022, he would get a sensation in both legs that would occur with standing or walking but not if he is sitting.   He went from walking well to a cane to a walker over the last few weeks.    The right leg is weaker than his left.  He has numbness on both feet  He had noted mild toe numbness x several  years but the leg dysesthesia is new.   He denies any numbnes, pain or weakness in his arms.   He is needing to urina more frequently and he has had a little incontinence over the last month  He has a h/o spinal issues.   He had right sciatica type pain.   Prompting surgery in 2018.  He had PLIF L2-S2 in 2018 CT 05/08/2017 showed mild spinal stenosis at L2L3, L3L4, L4L5 and L5S1 but significant foraminal narrowing at ost of these levels.   He had grade 1 anterolisthesis at L5S1.   In T spine he had mild spinal stenosis associated with linear calcifications at T8T9.     Lumbar xrays 05/27/2017 show PLIF from L2-S2.    MRIs 09/15/2021 showed significant progressive degenerative changes at T11-T12 with myelopathic signal within the spinal cord.  He was referred to Dr. 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  He had spinal surgery 10/01/2021 and had some improvement of his gait.SABRA      He had an MRI of the thoracic spine 07/15/2022.  It shows fluid in the T11-T12 disc with endplate irregularity/bone marrow edema.  There is still moderately severe  spinal stenosis.  There are some increased signal within the spinal cord.   The T11-T12 level has some concern for ongoing discitis a new T2 hyperintense lobulated focus is seen near the anterior right paraspinal soft tissue at T12 and abscess cannot be ruled out.  He had repeat neurosurgery 08/07/2022 (Dr. Joshua)   Imaging MRI of the thoracic spine 07/15/2022.  It shows fluid in the T11-T12 disc with endplate irregularity/bone marrow edema.  There is still moderately severe spinal stenosis.  There are some increased signal within the spinal cord.   The T11-T12 level has some concern for ongoing discitis a new T2 hyperintense lobulated focus is seen near the anterior right paraspinal soft tissue at T12 and abscess cannot be ruled out.   MRI of the brain 09/15/2021 shows  mild to moderate generalized cortical atrophy with the ventricles proportionate to the extent of atrophy.  The brain parenchyma was normal for age.    MRI of the cervical spine 09/15/2021 showed normal spinal cord.  There were degenerative changes that could affect the right C5 nerve root at C4-C5 and mild spinal stenosis at C5-C6 but no significant spinal stenosis.  MRI of the thoracic spine 09/15/2021 showed myelopathic signal within the spinal cord at T11-T12.  At that level there is moderately severe spinal stenosis due to disc protrusion, endplate spurring and facet hypertrophy.  There was edema at the endplates.  At T8-T9, there were degenerative changes corresponding to the calcifications noted on the CT scan from 2018  Labs 05/30/2021:  TSH, T4 hemoglobin, white blood cell were normal. Cholesterol was 172 and HDL was 101 Creatinine was elevated at 1.34.  Hemoglobin A1c borderline at 5 9, glucose 109  B12 3554 (193-986); folate elevated at 30.8 (3.1-17.5)   REVIEW OF SYSTEMS: Constitutional: No fevers, chills, sweats, or change in appetite Eyes: No visual changes, double vision, eye pain Ear, nose and throat: No hearing loss, ear pain, nasal congestion, sore throat Cardiovascular: No chest pain, palpitations Respiratory:  No shortness of breath at rest or with exertion.   No wheezes GastrointestinaI: No nausea, vomiting, diarrhea, abdominal pain, fecal incontinence Genitourinary:  No dysuria, urinary retention or frequency.  No nocturia. Musculoskeletal:  No neck pain, back pain Integumentary: No rash, pruritus, skin lesions Neurological: as above Psychiatric: No depression at this time.  No anxiety Endocrine: No palpitations, diaphoresis, change in appetite, change in weigh or increased thirst Hematologic/Lymphatic:  No anemia, purpura, petechiae. Allergic/Immunologic: No itchy/runny eyes, nasal congestion, recent allergic reactions, rashes  ALLERGIES: Allergies  Allergen  Reactions   Diphenhydramine Anxiety and Other (See Comments)    Agitation Jittery feeling   Neurontin  [Gabapentin ] Other (See Comments)    Mental status/awareness changes    HOME MEDICATIONS:  Current Outpatient Medications:    losartan  (COZAAR ) 50 MG tablet, Take 50 mg by mouth in the morning., Disp: , Rfl:    Coenzyme Q10 (CO Q-10 PO), Take 2 capsules by mouth 2 (two) times daily., Disp: , Rfl:    DULoxetine (CYMBALTA) 20 MG capsule, Take 20 mg by mouth 2 (two) times daily., Disp: , Rfl:    metoprolol  tartrate (LOPRESSOR ) 25 MG tablet, Take 0.5 tablets (12.5 mg total) by mouth 2 (two) times daily. (Patient taking differently: Take 12.5 mg by mouth at bedtime.), Disp: 60 tablet, Rfl: 2   Multiple Vitamin (MULTIVITAMIN) tablet, Take 1 tablet by mouth at bedtime., Disp: , Rfl:    Omega-3 Fatty Acids (FISH OIL PO), Take 2 capsules by mouth at bedtime.,  Disp: , Rfl:    tamsulosin  (FLOMAX ) 0.4 MG CAPS capsule, TAKE 1 CAPSULE BY MOUTH ONCE DAILY. NEEDS TO SCHEDULE APPT., Disp: 30 capsule, Rfl: 2   tolterodine  (DETROL  LA) 2 MG 24 hr capsule, Take 1 capsule by mouth once daily, Disp: 90 capsule, Rfl: 0  PAST MEDICAL HISTORY: Past Medical History:  Diagnosis Date   Allergy    Arthritis    Cataracts, bilateral    bil cataracts removed   DDD (degenerative disc disease), lumbosacral    pain radiates down left leg   Environmental and seasonal allergies    GERD (gastroesophageal reflux disease)    Hearing loss    bilateral hearing aids   Heart murmur    recently  saw cardiologist and said it was fine    Hx of adenomatous colonic polyps 04/23/2016   Hyperlipidemia    Wound dehiscence 05/31/2017    PAST SURGICAL HISTORY: Past Surgical History:  Procedure Laterality Date   APPLICATION OF ROBOTIC ASSISTANCE FOR SPINAL PROCEDURE N/A 05/27/2017   Procedure: APPLICATION OF ROBOTIC ASSISTANCE FOR SPINAL PROCEDURE;  Surgeon: Ditty, Morene Hicks, MD;  Location: [facility] OR;  Service: Neurosurgery;   Laterality: N/A;   cataract     bilat   CIRCUMCISION     COLONOSCOPY     COLONOSCOPY W/ POLYPECTOMY     LAMINECTOMY  05/27/2017   SACRAL   LAMINECTOMY WITH POSTERIOR LATERAL ARTHRODESIS LEVEL 1 N/A 08/08/2022   Procedure: POSTERIOR LATERAL ARTHRODESIS Thoracic eleven-twelve with Pedicle Screw Fixation and Decompression;  Surgeon: Joshua Alm RAMAN, MD;  Location: [facility] OR;  Service: Neurosurgery;  Laterality: N/A;   LUMBAR WOUND DEBRIDEMENT N/A 05/31/2017   Procedure: LUMBAR WOUND REVISION;  Surgeon: Joshua Alm RAMAN, MD;  Location: [facility] OR;  Service: Neurosurgery;  Laterality: N/A;   THORACIC DISCECTOMY N/A 10/01/2021   Procedure: T11-12 Decompression with possible instrumented fusion;  Surgeon: Joshua Alm RAMAN, MD;  Location: [facility] OR;  Service: Neurosurgery;  Laterality: N/A;   TONSILLECTOMY     wisdom teth extraction      FAMILY HISTORY: Family History  Problem Relation Age of Onset   Alzheimer's disease Father    Stroke Brother    Colon cancer Neg Hx    Esophageal cancer Neg Hx    Rectal cancer Neg Hx    Stomach cancer Neg Hx    Pancreatic cancer Neg Hx    Prostate cancer Neg Hx     SOCIAL HISTORY:  Social History   Socioeconomic History   Marital status: Married    Spouse name: Rock   Number of children: 2   Years of education: Not on file   Highest education level: Professional school degree (e.g., MD, DDS, DVM, JD)  Occupational History   Not on file  Tobacco Use   Smoking status: Former    Current packs/day: 0.00    Average packs/day: 1.0 packs/day    Types: Cigarettes    Quit date: 08/02/2016    Years since quitting: 8.0   Smokeless tobacco: Never  Vaping Use   Vaping status: Never Used  Substance and Sexual Activity   Alcohol  use: Yes    Alcohol /week: 7.0 standard drinks of alcohol     Types: 7 Glasses of wine per week   Drug use: No   Sexual activity: Not on file  Other Topics Concern   Not on file  Social History Narrative   Married (wife dx Parkinson's  2019-20)   1 daughter   1 son   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 [employer]   Former smoker, no tobacco or drugs now, 7 drinks/week   Has a law degree   Social Drivers of Health   Tobacco Use: Medium Risk (12/04/2022)   Patient History    Smoking Tobacco Use: Former    Smokeless Tobacco Use: Never    Passive Exposure: Not on Actuary Strain: Not on file  Food Insecurity: No Food Insecurity (08/08/2022)   Hunger Vital Sign    Worried About Running Out of Food in the Last Year: Never true    Ran Out of Food in the Last Year: Never true  Transportation Needs: No Transportation Needs (08/08/2022)   PRAPARE - Administrator, Civil Service (Medical): No    Lack of Transportation (Non-Medical): No  Physical Activity: Not on file  Stress: Not on file  Social Connections: Not on file  Intimate Partner Violence: Not At Risk (08/08/2022)   Humiliation, Afraid, Rape, and Kick questionnaire    Fear of Current or Ex-Partner: No    Emotionally Abused: No    Physically Abused: No    Sexually Abused: No  Depression (PHQ2-9): Not on file  Alcohol  Screen: Not on file  Housing: Low Risk (08/08/2022)   Housing    Last Housing Risk Score: 0  Utilities: Not At Risk (08/08/2022)   AHC Utilities    Threatened with loss of utilities: No  Health Literacy: Not on file     PHYSICAL EXAM  There were no vitals filed for this visit.   There is no height or weight on file to calculate BMI.   General: The patient is well-developed and well-nourished and in no acute distress  HEENT:  Head is [place]/AT.  Sclera are anicteric.     Neck/back: No carotid bruits are noted.  The neck is nontender with mildly reduced range of motion.  He has a well-healed scar over the lower thoracic spine and lumbar spine  Neurologic Exam  Mental status: The patient is alert and oriented x 3 at the time of the examination. The patient has apparent  normal recent and remote memory, with an apparently normal attention span and concentration ability.   Speech is normal.  Cranial nerves: Extraocular movements are full.  Facial strength and sensation was normal.  No dysarthria is noted.  Mildly reduced hearing.  Motor:  Muscle bulk is normal.   Tone is normal. Strength is  5 / 5 in arms, 4+/5 EHL  Sensory:  He has normal sensation in arms and proximal legs.   He had moderate reduced vibration sensation at the knee and near absent at ankles  .  Pinprick and touch sensation were fairly normal.  Coordination: Cerebellar testing reveals good finger-nose-finger and reduced heel-to-shin bilaterally.  Gait and station: He needs to use both arms to rise from the wheelchair.  Mildly spastic gait without a cane with good stride but reduced balance turn.  SABRASABRASABRARomberg is positive.  Reflexes: Deep tendon reflexes are symmetric and normal in arms, 3+ at knees (spread) and 3 right and  2 left ankle.        ASSESSMENT AND PLAN  Gait disturbance  Numbness  Neuropathy  Thoracic spinal stenosis  Urinary urgency  Spasticity  He has done well after the second surgery.   Though he feels there has been some progression, his exam is similar to April 2024.  Advised to stay active try to walk 1 mile or more  every day.  He can try playing golf again.  He notes more pain when he walks further.  He is advised not to overexert himself  Continue Cymbalta for dysesthetic pain, Detrol /Flomax  for bladder. He has grab bars in the shower and a shower bench. Return as needed for new or worsening neurologic symptoms.  This visit is part of a comprehensive longitudinal care medical relationship regarding the patients primary diagnosis of thoracic myelopathy and related concerns.  Lehi Phifer A. Vear, MD, [facility] 08/18/2024, 11:28 AM Certified in Neurology, Clinical Neurophysiology, Sleep Medicine and Neuroimaging  [facility] [address],  [address] [place], [place] [address] [phone] "

## 2024-08-30 ENCOUNTER — Other Ambulatory Visit: Payer: Self-pay | Admitting: Neurology

## 2024-09-01 ENCOUNTER — Other Ambulatory Visit: Payer: Self-pay | Admitting: Neurology

## 2024-09-21 ENCOUNTER — Other Ambulatory Visit: Payer: Self-pay | Admitting: Neurology
